# Patient Record
Sex: Male | Born: 1960 | Race: Black or African American | Hispanic: No | Marital: Single | Smoking: Current every day smoker
Health system: Southern US, Community
[De-identification: ages and names within clinical notes are randomized; demographics above are authoritative.]

## PROBLEM LIST (undated history)

## (undated) DIAGNOSIS — I10 Essential (primary) hypertension: Secondary | ICD-10-CM

## (undated) DIAGNOSIS — G43909 Migraine, unspecified, not intractable, without status migrainosus: Secondary | ICD-10-CM

## (undated) DIAGNOSIS — Z21 Asymptomatic human immunodeficiency virus [HIV] infection status: Secondary | ICD-10-CM

## (undated) DIAGNOSIS — B20 Human immunodeficiency virus [HIV] disease: Secondary | ICD-10-CM

## (undated) HISTORY — DX: Asymptomatic human immunodeficiency virus (hiv) infection status: Z21

## (undated) HISTORY — DX: Essential (primary) hypertension: I10

## (undated) HISTORY — DX: Human immunodeficiency virus (HIV) disease: B20

---

## 1988-05-16 HISTORY — PX: APPENDECTOMY: SHX54

## 2005-03-28 ENCOUNTER — Emergency Department (HOSPITAL_COMMUNITY): Admission: EM | Admit: 2005-03-28 | Discharge: 2005-03-28 | Payer: Self-pay | Admitting: Emergency Medicine

## 2008-11-30 ENCOUNTER — Emergency Department (HOSPITAL_COMMUNITY): Admission: EM | Admit: 2008-11-30 | Discharge: 2008-11-30 | Payer: Self-pay | Admitting: Emergency Medicine

## 2010-11-23 ENCOUNTER — Ambulatory Visit: Payer: Medicare Other

## 2010-12-02 ENCOUNTER — Ambulatory Visit (INDEPENDENT_AMBULATORY_CARE_PROVIDER_SITE_OTHER): Payer: Medicare Other

## 2010-12-02 DIAGNOSIS — Z113 Encounter for screening for infections with a predominantly sexual mode of transmission: Secondary | ICD-10-CM

## 2010-12-02 DIAGNOSIS — IMO0001 Reserved for inherently not codable concepts without codable children: Secondary | ICD-10-CM

## 2010-12-02 DIAGNOSIS — E785 Hyperlipidemia, unspecified: Secondary | ICD-10-CM

## 2010-12-02 DIAGNOSIS — Z111 Encounter for screening for respiratory tuberculosis: Secondary | ICD-10-CM

## 2010-12-02 DIAGNOSIS — B20 Human immunodeficiency virus [HIV] disease: Secondary | ICD-10-CM

## 2010-12-03 LAB — CBC WITH DIFFERENTIAL/PLATELET
Basophils Absolute: 0 10*3/uL (ref 0.0–0.1)
Basophils Relative: 1 % (ref 0–1)
Eosinophils Absolute: 0 10*3/uL (ref 0.0–0.7)
Eosinophils Relative: 1 % (ref 0–5)
HCT: 43.2 % (ref 39.0–52.0)
Hemoglobin: 14 g/dL (ref 13.0–17.0)
Lymphocytes Relative: 50 % — ABNORMAL HIGH (ref 12–46)
Lymphs Abs: 2.1 10*3/uL (ref 0.7–4.0)
MCH: 29.3 pg (ref 26.0–34.0)
MCHC: 32.4 g/dL (ref 30.0–36.0)
MCV: 90.4 fL (ref 78.0–100.0)
Monocytes Absolute: 0.5 10*3/uL (ref 0.1–1.0)
Monocytes Relative: 11 % (ref 3–12)
Neutro Abs: 1.6 10*3/uL — ABNORMAL LOW (ref 1.7–7.7)
Neutrophils Relative %: 38 % — ABNORMAL LOW (ref 43–77)
Platelets: 176 10*3/uL (ref 150–400)
RBC: 4.78 MIL/uL (ref 4.22–5.81)
RDW: 13.7 % (ref 11.5–15.5)
WBC: 4.2 10*3/uL (ref 4.0–10.5)

## 2010-12-03 LAB — URINALYSIS, MICROSCOPIC ONLY
Bacteria, UA: NONE SEEN
Casts: NONE SEEN
Crystals: NONE SEEN

## 2010-12-03 LAB — LIPID PANEL
Cholesterol: 181 mg/dL (ref 0–200)
HDL: 71 mg/dL (ref >39)
LDL Cholesterol: 96 mg/dL (ref 0–99)
Total CHOL/HDL Ratio: 2.5 Ratio
Triglycerides: 71 mg/dL (ref <150)
VLDL: 14 mg/dL (ref 0–40)

## 2010-12-03 LAB — URINALYSIS, ROUTINE W REFLEX MICROSCOPIC
Bilirubin Urine: NEGATIVE
Glucose, UA: NEGATIVE mg/dL
Hgb urine dipstick: NEGATIVE
Nitrite: NEGATIVE
Protein, ur: NEGATIVE mg/dL
Specific Gravity, Urine: 1.031 — ABNORMAL HIGH (ref 1.005–1.030)
Urobilinogen, UA: 0.2 mg/dL (ref 0.0–1.0)
pH: 5.5 (ref 5.0–8.0)

## 2010-12-03 LAB — HEPATITIS B SURFACE ANTIBODY,QUALITATIVE: Hep B S Ab: NEGATIVE

## 2010-12-03 LAB — COMPLETE METABOLIC PANEL WITH GFR
ALT: <8 U/L (ref 0–53)
AST: 14 U/L (ref 0–37)
Albumin: 4.6 g/dL (ref 3.5–5.2)
Alkaline Phosphatase: 54 U/L (ref 39–117)
BUN: 15 mg/dL (ref 6–23)
CO2: 26 mEq/L (ref 19–32)
Calcium: 9.4 mg/dL (ref 8.4–10.5)
Chloride: 108 mEq/L (ref 96–112)
Creat: 1.1 mg/dL (ref 0.50–1.35)
GFR, Est African American: >60 mL/min (ref >60)
GFR, Est Non African American: >60 mL/min (ref >60)
Glucose, Bld: 88 mg/dL (ref 70–99)
Potassium: 4.2 mEq/L (ref 3.5–5.3)
Sodium: 144 mEq/L (ref 135–145)
Total Bilirubin: 0.6 mg/dL (ref 0.3–1.2)
Total Protein: 6.8 g/dL (ref 6.0–8.3)

## 2010-12-03 LAB — T-HELPER CELL (CD4) - (RCID CLINIC ONLY)
CD4 % Helper T Cell: 31 % — ABNORMAL LOW (ref 33–55)
CD4 T Cell Abs: 670 uL (ref 400–2700)

## 2010-12-03 LAB — HEPATITIS C ANTIBODY: HCV Ab: NEGATIVE

## 2010-12-03 LAB — HEPATITIS B CORE ANTIBODY, IGM: Hep B C IgM: NEGATIVE

## 2010-12-03 LAB — HIV-1 RNA QUANT-NO REFLEX-BLD
HIV 1 RNA Quant: <20 copies/mL (ref <20)
HIV-1 RNA Quant, Log: <1.3 {Log} (ref <1.30)

## 2010-12-03 LAB — RPR

## 2010-12-03 LAB — HEPATITIS A ANTIBODY, IGM: Hep A IgM: NEGATIVE

## 2010-12-03 LAB — HEPATITIS B SURFACE ANTIGEN: Hepatitis B Surface Ag: NEGATIVE

## 2010-12-03 LAB — GC/CHLAMYDIA PROBE AMP, URINE
Chlamydia, Swab/Urine, PCR: NEGATIVE
GC Probe Amp, Urine: NEGATIVE

## 2010-12-06 LAB — TB SKIN TEST: TB Skin Test: NEGATIVE mm

## 2010-12-15 ENCOUNTER — Ambulatory Visit (INDEPENDENT_AMBULATORY_CARE_PROVIDER_SITE_OTHER): Payer: Medicare Other | Admitting: Internal Medicine

## 2010-12-15 ENCOUNTER — Encounter: Payer: Self-pay | Admitting: Internal Medicine

## 2010-12-15 VITALS — BP 121/80 | HR 62 | Temp 97.6°F | Ht 72.0 in | Wt 192.5 lb

## 2010-12-15 DIAGNOSIS — B20 Human immunodeficiency virus [HIV] disease: Secondary | ICD-10-CM

## 2010-12-15 DIAGNOSIS — Z21 Asymptomatic human immunodeficiency virus [HIV] infection status: Secondary | ICD-10-CM | POA: Insufficient documentation

## 2010-12-15 DIAGNOSIS — Z23 Encounter for immunization: Secondary | ICD-10-CM

## 2010-12-15 MED ORDER — EFAVIRENZ-EMTRICITAB-TENOFOVIR 600-200-300 MG PO TABS: 1.0000 | ORAL_TABLET | Freq: Every day | ORAL | Status: DC

## 2010-12-15 NOTE — Progress Notes (Signed)
  Subjective:    Patient ID: John Bullock, male    DOB: 1960/12/01, 50 y.o.   MRN: [id number]  HPI here as a new patient.  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  Started initially on combivir and Sustiva and changed to Atripla about 5 years ago.  Has been stable on Atripla since with a report of undetectable virus except for one remote blip.  He endorses 100% compliance.  No history of GC, chlamydia, syphillis.  Patient excercises daily, though he does smoke.  He is attempting to quit but patches made him tachycardic.  He does use condoms.     Review of Systems  All other systems reviewed and are negative.       Objective:   Physical Exam  Constitutional: He is oriented to person, place, and time. He appears well-developed and well-nourished. No distress.  HENT:  Mouth/Throat: No oropharyngeal exudate.  Eyes: No scleral icterus.  Neck: Normal range of motion. Neck supple.  Cardiovascular: Normal rate, regular rhythm and normal heart sounds.  Exam reveals no gallop and no friction rub.   No murmur heard. Pulmonary/Chest: Effort normal and breath sounds normal. No respiratory distress. He has no wheezes. He has no rales.  Abdominal: Soft. Bowel sounds are normal. He exhibits no distension. There is no tenderness. There is no rebound.  Lymphadenopathy:    He has no cervical adenopathy.  Neurological: He is alert and oriented to person, place, and time.  Skin: Skin is warm and dry. No erythema.  Psychiatric: He has a normal mood and affect. His behavior is normal.          Assessment & Plan:

## 2010-12-15 NOTE — Assessment & Plan Note (Signed)
Excellent adherence and no complaints with medication.  He was encouraged to continue compliance.  I did discuss excellent long term prognosis with continued compliance and emphasized the need to quit smoking, and continue to care for himself.  I emphasized condoms 100% of the time.  Today he will start hep A and B vaccine series.  Follow up in 4 months, [place]  If indicated.

## 2010-12-15 NOTE — Progress Notes (Signed)
Addended by: Jennet Maduro D on: 12/15/2010 09:25 AM   Modules accepted: Orders

## 2011-02-09 ENCOUNTER — Ambulatory Visit (INDEPENDENT_AMBULATORY_CARE_PROVIDER_SITE_OTHER): Payer: Medicare Other | Admitting: Licensed Clinical Social Worker

## 2011-02-09 DIAGNOSIS — B20 Human immunodeficiency virus [HIV] disease: Secondary | ICD-10-CM

## 2011-02-09 DIAGNOSIS — Z23 Encounter for immunization: Secondary | ICD-10-CM

## 2011-05-05 ENCOUNTER — Other Ambulatory Visit: Payer: Medicare Other

## 2011-05-05 ENCOUNTER — Other Ambulatory Visit: Payer: Self-pay | Admitting: Infectious Diseases

## 2011-05-05 DIAGNOSIS — B20 Human immunodeficiency virus [HIV] disease: Secondary | ICD-10-CM

## 2011-05-05 LAB — COMPLETE METABOLIC PANEL WITH GFR
ALT: 9 U/L (ref 0–53)
AST: 21 U/L (ref 0–37)
Albumin: 4.6 g/dL (ref 3.5–5.2)
Alkaline Phosphatase: 67 U/L (ref 39–117)
BUN: 16 mg/dL (ref 6–23)
CO2: 26 mEq/L (ref 19–32)
Calcium: 9 mg/dL (ref 8.4–10.5)
Chloride: 110 mEq/L (ref 96–112)
Creat: 1.12 mg/dL (ref 0.50–1.35)
GFR, Est African American: 89 mL/min
GFR, Est Non African American: 77 mL/min
Glucose, Bld: 88 mg/dL (ref 70–99)
Potassium: 4.4 mEq/L (ref 3.5–5.3)
Sodium: 142 mEq/L (ref 135–145)
Total Bilirubin: 0.3 mg/dL (ref 0.3–1.2)
Total Protein: 7.1 g/dL (ref 6.0–8.3)

## 2011-05-05 LAB — CBC WITH DIFFERENTIAL/PLATELET
Basophils Absolute: 0 10*3/uL (ref 0.0–0.1)
Basophils Relative: 0 % (ref 0–1)
Eosinophils Absolute: 0.1 10*3/uL (ref 0.0–0.7)
Eosinophils Relative: 2 % (ref 0–5)
HCT: 42.7 % (ref 39.0–52.0)
Hemoglobin: 14.3 g/dL (ref 13.0–17.0)
Lymphocytes Relative: 53 % — ABNORMAL HIGH (ref 12–46)
Lymphs Abs: 1.5 10*3/uL (ref 0.7–4.0)
MCH: 29.7 pg (ref 26.0–34.0)
MCHC: 33.5 g/dL (ref 30.0–36.0)
MCV: 88.8 fL (ref 78.0–100.0)
Monocytes Absolute: 0.4 10*3/uL (ref 0.1–1.0)
Monocytes Relative: 13 % — ABNORMAL HIGH (ref 3–12)
Neutro Abs: 0.9 10*3/uL — ABNORMAL LOW (ref 1.7–7.7)
Neutrophils Relative %: 33 % — ABNORMAL LOW (ref 43–77)
Platelets: 164 10*3/uL (ref 150–400)
RBC: 4.81 MIL/uL (ref 4.22–5.81)
RDW: 13.4 % (ref 11.5–15.5)
WBC: 2.9 10*3/uL — ABNORMAL LOW (ref 4.0–10.5)

## 2011-05-06 LAB — T-HELPER CELL (CD4) - (RCID CLINIC ONLY)
CD4 % Helper T Cell: 28 % — ABNORMAL LOW (ref 33–55)
CD4 T Cell Abs: 480 uL (ref 400–2700)

## 2011-05-06 LAB — PATHOLOGIST SMEAR REVIEW

## 2011-05-09 LAB — HIV-1 RNA QUANT-NO REFLEX-BLD
HIV 1 RNA Quant: 34 copies/mL — ABNORMAL HIGH (ref <20)
HIV-1 RNA Quant, Log: 1.53 {Log} — ABNORMAL HIGH (ref <1.30)

## 2011-05-24 ENCOUNTER — Ambulatory Visit (INDEPENDENT_AMBULATORY_CARE_PROVIDER_SITE_OTHER): Payer: Medicare Other | Admitting: Internal Medicine

## 2011-05-24 ENCOUNTER — Encounter: Payer: Self-pay | Admitting: Internal Medicine

## 2011-05-24 ENCOUNTER — Ambulatory Visit: Payer: Medicare Other | Admitting: Internal Medicine

## 2011-05-24 VITALS — BP 115/72 | HR 60 | Temp 97.4°F | Ht 72.0 in | Wt 205.1 lb

## 2011-05-24 DIAGNOSIS — B20 Human immunodeficiency virus [HIV] disease: Secondary | ICD-10-CM

## 2011-05-24 NOTE — Assessment & Plan Note (Signed)
He continues to do well and has no issues with compliance.  I encouraged continued compliance and also reiterated that he can live long and healthy with continued medication.  I reminded him of condom use and he is up to date with vaccines.  I did discuss at length the need to quit smoking and that he is at significant risk for complications from smoking.  We discussed the patch but the patient has had an adverse reaction to that previously.  He is motivated to quit and will try cold [place].

## 2011-05-24 NOTE — Progress Notes (Signed)
  Subjective:    Patient ID: John Bullock, male    DOB: November 16, 1960, 51 y.o.   MRN: [id number]  HPI 51 yo with hx of hiv including a history of PCP pneumonia but has been stable on Atripla for more than 5 years.  He comes in today for routine care with no complaints.  He continues to have excellent adherence to the medications and no new issues.  He denies diarrhea, weight loss, or other new issues.  No recent hospitalizations.      Review of Systems  Constitutional: Negative for fever, chills, activity change, fatigue and unexpected weight change.  HENT: Negative for sore throat and trouble swallowing.   Respiratory: Negative for cough and shortness of breath.   Gastrointestinal: Negative for nausea, abdominal pain and diarrhea.  Genitourinary: Negative for discharge and genital sores.  Musculoskeletal: Negative for myalgias.  Skin: Negative for rash.  Neurological: Negative for headaches.  Hematological: Negative for adenopathy.  Psychiatric/Behavioral: Negative for sleep disturbance and dysphoric mood.       Objective:   Physical Exam  Constitutional: He is oriented to person, place, and time. He appears well-developed and well-nourished. No distress.  HENT:  Mouth/Throat: Oropharynx is clear and moist. No oropharyngeal exudate.  Eyes: No scleral icterus.  Cardiovascular: Normal rate, regular rhythm and normal heart sounds.  Exam reveals no gallop and no friction rub.   No murmur heard. Pulmonary/Chest: Effort normal and breath sounds normal. No respiratory distress. He has no wheezes.  Abdominal: Soft. Bowel sounds are normal. He exhibits no distension. There is no tenderness.  Lymphadenopathy:    He has no cervical adenopathy.  Neurological: He is alert and oriented to person, place, and time.  Skin: Skin is warm and dry. No rash noted. No erythema.  Psychiatric: He has a normal mood and affect. His behavior is normal.          Assessment & Plan:

## 2011-05-24 NOTE — Patient Instructions (Signed)
Follow up in 4 months 

## 2011-05-30 ENCOUNTER — Ambulatory Visit (INDEPENDENT_AMBULATORY_CARE_PROVIDER_SITE_OTHER): Payer: Medicare Other

## 2011-05-30 DIAGNOSIS — Z23 Encounter for immunization: Secondary | ICD-10-CM | POA: Diagnosis not present

## 2011-06-09 ENCOUNTER — Encounter (HOSPITAL_COMMUNITY): Payer: Self-pay | Admitting: *Deleted

## 2011-06-09 ENCOUNTER — Emergency Department (INDEPENDENT_AMBULATORY_CARE_PROVIDER_SITE_OTHER)
Admission: EM | Admit: 2011-06-09 | Discharge: 2011-06-09 | Disposition: A | Discharge status: Home / Self Care | Payer: Medicare Other | Source: Home / Self Care | Attending: Family Medicine | Admitting: Family Medicine

## 2011-06-09 DIAGNOSIS — S39012A Strain of muscle, fascia and tendon of lower back, initial encounter: Secondary | ICD-10-CM

## 2011-06-09 DIAGNOSIS — M5380 Other specified dorsopathies, site unspecified: Secondary | ICD-10-CM

## 2011-06-09 DIAGNOSIS — S335XXA Sprain of ligaments of lumbar spine, initial encounter: Secondary | ICD-10-CM | POA: Diagnosis not present

## 2011-06-09 DIAGNOSIS — M62830 Muscle spasm of back: Secondary | ICD-10-CM

## 2011-06-09 MED ORDER — CYCLOBENZAPRINE HCL 5 MG PO TABS: 5.0000 mg | ORAL_TABLET | Freq: Three times a day (TID) | ORAL | Status: AC | PRN

## 2011-06-09 MED ORDER — NAPROXEN 500 MG PO TABS: 500.0000 mg | ORAL_TABLET | Freq: Two times a day (BID) | ORAL | Status: DC

## 2011-06-09 NOTE — ED Notes (Signed)
Pt      Advised  That  He    Would  Be  Seen  Soon pt  Said  OK

## 2011-06-09 NOTE — ED Provider Notes (Signed)
History     CSN: [id number]  Arrival date & time 06/09/11  1333   First MD Initiated Contact with Patient 06/09/11 1351      Chief Complaint  Patient presents with  . Back Pain    (Consider location/radiation/quality/duration/timing/severity/associated sxs/prior treatment) HPI Comments: Slipped and fell last week, falling forward; went to [place], stood all day, now with persistent tightness in RIGHT lower back, no numbness, no tingling, no weakness. Tender to palpation over RIGHT lower paraspinal muscles.  Patient is a 51 y.o. male presenting with back pain. The history is provided by the patient.  Back Pain  This is a new problem. The current episode started more than 1 week ago. The problem occurs constantly. The problem has not changed since onset.The pain is associated with falling. The pain is present in the lumbar spine. The pain does not radiate. The pain is moderate. Pertinent negatives include no numbness, no bowel incontinence, no perianal numbness, no paresthesias, no paresis, no tingling and no weakness.    Past Medical History  Diagnosis Date  . HIV infection     Past Surgical History  Procedure Date  . Appendectomy 1990    Family History  Problem Relation Age of Onset  . Brain cancer Mother   . Testicular cancer Father     History  Substance Use Topics  . Smoking status: Current Everyday Smoker -- 1.0 packs/day for 30 years    Types: Cigarettes  . Smokeless tobacco: Never Used   Comment: trying to quit-wants patches  . Alcohol Use: 0.5 oz/week    1 drink(s) per week     social drinker   alcohol       Review of Systems  Constitutional: Negative.   HENT: Negative.   Eyes: Negative.   Respiratory: Negative.   Cardiovascular: Negative.   Gastrointestinal: Negative.  Negative for bowel incontinence.  Genitourinary: Negative.   Musculoskeletal: Positive for back pain.  Skin: Negative.   Neurological: Negative.  Negative for tingling,  weakness, numbness and paresthesias.    Allergies  Nicotine step  Home Medications   Current Outpatient Rx  Name Route Sig Dispense Refill  . EFAVIRENZ-EMTRICITAB-TENOFOVIR 600-200-300 MG PO TABS Oral Take 1 tablet by mouth at bedtime. 30 tablet 11  . NAPROXEN 500 MG PO TABS Oral Take 1 tablet (500 mg total) by mouth 2 (two) times daily. 30 tablet 0    BP 137/95  Pulse 90  Temp(Src) 98.1 F (36.7 C) (Oral)  Resp 18  SpO2 96%  Physical Exam  Nursing note and vitals reviewed. Constitutional: He is oriented to person, place, and time. He appears well-developed and well-nourished.  HENT:  Head: Normocephalic and atraumatic.  Eyes: EOM are normal.  Neck: Normal range of motion.  Pulmonary/Chest: Effort normal.  Musculoskeletal: Normal range of motion.       Lumbar back: He exhibits tenderness, bony tenderness and pain.       Back: negative straight leg raise bilaterally, negative Fabers bilaterally, no pain with internal or external rotation bilaterally, full flexion, extension, abduction, and adduction; 5/5 strength  Neurological: He is alert and oriented to person, place, and time.  Skin: Skin is warm and dry.  Psychiatric: His behavior is normal.    ED Course  Procedures (including critical care time)  Labs Reviewed - No data to display No results found.   1. Lumbar strain   2. Muscle spasm of back       MDM  rx given for naproxen  and cyclobenzaprine PRN; return precautions given        Richardo Priest, MD 07/02/11 2245

## 2011-06-09 NOTE — ED Notes (Signed)
Pt  Reports   He  Felled    1  Week  Ago     Ago  When  He  Slipped  On  [employer]   He  Reports   Low  Back pain - he  Ambulates  Upright        And  Reports  The  Pain is  Worse  On movement

## 2011-06-20 ENCOUNTER — Ambulatory Visit: Payer: Medicare Other | Admitting: Internal Medicine

## 2011-07-25 ENCOUNTER — Emergency Department (HOSPITAL_COMMUNITY): Payer: Medicare Other

## 2011-07-25 ENCOUNTER — Emergency Department (HOSPITAL_COMMUNITY)
Admission: EM | Admit: 2011-07-25 | Discharge: 2011-07-25 | Disposition: A | Discharge status: Home / Self Care | Payer: Medicare Other | Attending: Emergency Medicine | Admitting: Emergency Medicine

## 2011-07-25 ENCOUNTER — Encounter (HOSPITAL_COMMUNITY): Payer: Self-pay | Admitting: Emergency Medicine

## 2011-07-25 DIAGNOSIS — Z8701 Personal history of pneumonia (recurrent): Secondary | ICD-10-CM | POA: Insufficient documentation

## 2011-07-25 DIAGNOSIS — Z87891 Personal history of nicotine dependence: Secondary | ICD-10-CM | POA: Diagnosis not present

## 2011-07-25 DIAGNOSIS — G43909 Migraine, unspecified, not intractable, without status migrainosus: Secondary | ICD-10-CM | POA: Diagnosis not present

## 2011-07-25 DIAGNOSIS — Z21 Asymptomatic human immunodeficiency virus [HIV] infection status: Secondary | ICD-10-CM | POA: Insufficient documentation

## 2011-07-25 DIAGNOSIS — Z9889 Other specified postprocedural states: Secondary | ICD-10-CM | POA: Insufficient documentation

## 2011-07-25 DIAGNOSIS — R51 Headache: Secondary | ICD-10-CM | POA: Diagnosis not present

## 2011-07-25 DIAGNOSIS — R42 Dizziness and giddiness: Secondary | ICD-10-CM | POA: Diagnosis not present

## 2011-07-25 LAB — POCT I-STAT, CHEM 8
BUN: 18 mg/dL (ref 6–23)
Calcium, Ion: 1.1 mmol/L — ABNORMAL LOW (ref 1.12–1.32)
Chloride: 109 mEq/L (ref 96–112)
Creatinine, Ser: 1 mg/dL (ref 0.50–1.35)
Glucose, Bld: 103 mg/dL — ABNORMAL HIGH (ref 70–99)
HCT: 41 % (ref 39.0–52.0)
Hemoglobin: 13.9 g/dL (ref 13.0–17.0)
Potassium: 3.8 mEq/L (ref 3.5–5.1)
Sodium: 140 mEq/L (ref 135–145)
TCO2: 23 mmol/L (ref 0–100)

## 2011-07-25 MED ORDER — IOHEXOL 300 MG/ML  SOLN
100.0000 mL | Freq: Once | INTRAMUSCULAR | Status: AC | PRN
  Administered 2011-07-25: 100 mL via INTRAVENOUS

## 2011-07-25 MED ORDER — METOCLOPRAMIDE HCL 5 MG/ML IJ SOLN
10.0000 mg | Freq: Once | INTRAMUSCULAR | Status: AC
  Administered 2011-07-25: 10 mg via INTRAVENOUS
  Filled 2011-07-25: qty 2

## 2011-07-25 MED ORDER — HYDROMORPHONE HCL PF 1 MG/ML IJ SOLN
1.0000 mg | Freq: Once | INTRAMUSCULAR | Status: AC
  Administered 2011-07-25: 1 mg via INTRAVENOUS
  Filled 2011-07-25: qty 1

## 2011-07-25 MED ORDER — HYDROCODONE-ACETAMINOPHEN 5-325 MG PO TABS: 2.0000 | ORAL_TABLET | ORAL | Status: AC | PRN

## 2011-07-25 MED ORDER — ONDANSETRON HCL 4 MG/2ML IJ SOLN
INTRAMUSCULAR | Status: AC
  Administered 2011-07-25: 4 mg
  Filled 2011-07-25: qty 2

## 2011-07-25 NOTE — ED Notes (Signed)
Pt to ED via [facility].  C/o headache x 3 days with dizziness.

## 2011-07-25 NOTE — ED Notes (Signed)
Patient complaining of migraines for the last four days; patient states that he has a history of recurrent migraines and was hospitalized for his migraines several years ago (two day hospital stay).  Patient states that the migraines are not constant; patient states that the migraines start when he lies down for sleep at night.  Patient also complaining of dizziness, weakness, and light-sensitivity.  Patient denise nausea/vomiting.  Patient alert and oriented x4; PERRL present. Will continue to monitor.

## 2011-07-25 NOTE — Discharge Instructions (Signed)
Migraine Headache  A migraine is very bad pain on one or both sides of your head. The cause of a migraine is not always known. A migraine can be triggered or caused by different things, such as:   Alcohol.   Smoking.   Stress.   Periods (menstruation) in women.   Aged cheeses.   Foods or drinks that contain nitrates, glutamate, aspartame, or tyramine.   Lack of sleep.   Chocolate.   Caffeine.   Hunger.   Medicines, such as nitroglycerine (used to treat chest pain), birth control pills, estrogen, and some blood pressure medicines.  HOME CARE   Many medicines can help migraine pain or keep migraines from coming back. Your doctor can help you decide on a medicine or treatment program.   If you or your child gets a migraine, it may help to lie down in a dark, quiet room.   Keep a headache journal. This may help find out what is causing the headaches. For example, write down:   What you eat and drink.   How much sleep you get.   Any change to your diet or medicines.  GET HELP RIGHT AWAY IF:    The medicine does not work.   The pain begins again.   The neck is stiff.   You have trouble seeing.   The muscles are weak or you lose muscle control.   You have new symptoms.   You lose your balance.   You have trouble walking.   You feel faint or pass out.  MAKE SURE YOU:    Understand these instructions.   Will watch this condition.   Will get help right away if you are not doing well or get worse.  Document Released: 02/09/2008 Document Revised: 04/21/2011 Document Reviewed: 01/05/2009  [employer] Patient Information 2012 [employer].

## 2011-07-25 NOTE — ED Provider Notes (Addendum)
History     CSN: [id number]  Arrival date & time 07/25/11  8469   First MD Initiated Contact with Patient 07/25/11 [phone]      Chief Complaint  Patient presents with  . Headache    (Consider location/radiation/quality/duration/timing/severity/associated sxs/prior treatment) HPI Complains of left-sided headache throbbing in nature accompanied by photophobia rash onset 4 days ago feels like migraine he's had several years ago denies dizziness no other complaint . No nausea no other associated symptoms pain is nonradiating. No treatment prior to coming here Past Medical History  Diagnosis Date  . HIV infection    PCP pneumonia; CD4 count from 2 months ago 480  Past Surgical History  Procedure Date  . Appendectomy 1990    Family History  Problem Relation Age of Onset  . Brain cancer Mother   . Testicular cancer Father     History  Substance Use Topics  . Smoking status: Former Smoker -- 1.0 packs/day for 30 years    Types: Cigarettes    Quit date: 06/27/2011  . Smokeless tobacco: Never Used   Comment: trying to quit-wants patches  . Alcohol Use: 0.5 oz/week    1 drink(s) per week     denies on visit 07/25/11   quit smoking one month ago    Review of Systems  Constitutional: Negative.   Eyes: Positive for photophobia.  Respiratory: Negative.   Cardiovascular: Negative.   Gastrointestinal: Negative.   Musculoskeletal: Negative.   Skin: Negative.   Neurological: Positive for headaches.  Hematological: Negative.   Psychiatric/Behavioral: Negative.     Allergies  Nicotine step  Home Medications   Current Outpatient Rx  Name Route Sig Dispense Refill  . EFAVIRENZ-EMTRICITAB-TENOFOVIR 600-200-300 MG PO TABS Oral Take 1 tablet by mouth at bedtime. 30 tablet 11    BP 127/81  Pulse 69  Temp(Src) 97 F (36.1 C) (Oral)  Resp 16  SpO2 95%  Physical Exam  Constitutional: He appears well-developed and well-nourished.  HENT:  Head: Normocephalic and  atraumatic.  Eyes: Conjunctivae are normal. Pupils are equal, round, and reactive to light.       Positive photophobia  Neck: Neck supple. No tracheal deviation present. No thyromegaly present.  Cardiovascular: Normal rate and regular rhythm.   No murmur heard. Pulmonary/Chest: Effort normal and breath sounds normal.  Abdominal: Soft. Bowel sounds are normal. He exhibits no distension. There is no tenderness.  Musculoskeletal: Normal range of motion. He exhibits no edema and no tenderness.  Neurological: He is alert. No cranial nerve deficit. He exhibits normal muscle tone. Coordination normal.  Skin: Skin is warm and dry. No rash noted.  Psychiatric: He has a normal mood and affect.    ED Course  Procedures (including critical care time) 7:40 AM pain is improved but is not adequately under control patient remains alert appropriate Glasgow Coma Score 15.Dilaudid ordered. 9:05 AM feels much improved and ready to go home alert in angle toward Glasgow Coma Score 15 Labs Reviewed - No data to display No results found.   No diagnosis found.   Results for orders placed during the hospital encounter of 07/25/11  POCT I-STAT, CHEM 8      Component Value Range   Sodium 140  135 - 145 (mEq/L)   Potassium 3.8  3.5 - 5.1 (mEq/L)   Chloride 109  96 - 112 (mEq/L)   BUN 18  6 - 23 (mg/dL)   Creatinine, Ser 2.84  0.50 - 1.35 (mg/dL)   Glucose, Bld 132 (*)  70 - 99 (mg/dL)   Calcium, Ion 5.62 (*) 1.12 - 1.32 (mmol/L)   TCO2 23  0 - 100 (mmol/L)   Hemoglobin 13.9  13.0 - 17.0 (g/dL)   HCT 13.0  86.5 - 78.4 (%)   Ct Head W Wo Contrast  07/25/2011  *RADIOLOGY REPORT*  Clinical Data: Migraines, HIV positive  CT HEAD WITHOUT AND WITH CONTRAST  Technique:  Contiguous axial images were obtained from the base of the skull through the vertex without and with intravenous contrast.  Contrast: OMNIPAQUE IOHEXOL 300 MG/ML IJ SOLN  Comparison: None.  Findings: No evidence of parenchymal hemorrhage or  extra-axial fluid collection. No mass lesion, mass effect, or midline shift.  No CT evidence of acute infarction.  Following contrast administration, no enhancing lesions are seen.  Cerebral volume is age appropriate.  No ventriculomegaly.  The visualized paranasal sinuses are essentially clear. The mastoid air cells are unopacified.  No evidence of calvarial fracture.  IMPRESSION: Normal head CT.  No enhancing lesions are seen.  Original Report Authenticated By: Charline Bills, M.D.    MDM  Plan prescription hydrocodone-A. Pap Diagnosis: Migraine headache        Doug Sou, MD 07/25/11 6962  Doug Sou, MD 07/25/11 [phone]

## 2011-09-13 ENCOUNTER — Other Ambulatory Visit: Payer: Medicare Other

## 2011-09-13 DIAGNOSIS — B20 Human immunodeficiency virus [HIV] disease: Secondary | ICD-10-CM | POA: Diagnosis not present

## 2011-09-13 LAB — COMPREHENSIVE METABOLIC PANEL
ALT: 8 U/L (ref 0–53)
AST: 23 U/L (ref 0–37)
Albumin: 5 g/dL (ref 3.5–5.2)
Alkaline Phosphatase: 66 U/L (ref 39–117)
BUN: 19 mg/dL (ref 6–23)
CO2: 26 mEq/L (ref 19–32)
Calcium: 9.5 mg/dL (ref 8.4–10.5)
Chloride: 109 mEq/L (ref 96–112)
Creat: 1.32 mg/dL (ref 0.50–1.35)
Glucose, Bld: 100 mg/dL — ABNORMAL HIGH (ref 70–99)
Potassium: 4 mEq/L (ref 3.5–5.3)
Sodium: 140 mEq/L (ref 135–145)
Total Bilirubin: 0.5 mg/dL (ref 0.3–1.2)
Total Protein: 7.1 g/dL (ref 6.0–8.3)

## 2011-09-13 LAB — CBC WITH DIFFERENTIAL/PLATELET
Basophils Absolute: 0 10*3/uL (ref 0.0–0.1)
Basophils Relative: 0 % (ref 0–1)
Eosinophils Absolute: 0.1 10*3/uL (ref 0.0–0.7)
Eosinophils Relative: 2 % (ref 0–5)
HCT: 44.5 % (ref 39.0–52.0)
Hemoglobin: 14.8 g/dL (ref 13.0–17.0)
Lymphocytes Relative: 39 % (ref 12–46)
Lymphs Abs: 1.5 10*3/uL (ref 0.7–4.0)
MCH: 29.6 pg (ref 26.0–34.0)
MCHC: 33.3 g/dL (ref 30.0–36.0)
MCV: 89 fL (ref 78.0–100.0)
Monocytes Absolute: 0.5 10*3/uL (ref 0.1–1.0)
Monocytes Relative: 13 % — ABNORMAL HIGH (ref 3–12)
Neutro Abs: 1.8 10*3/uL (ref 1.7–7.7)
Neutrophils Relative %: 46 % (ref 43–77)
Platelets: 180 10*3/uL (ref 150–400)
RBC: 5 MIL/uL (ref 4.22–5.81)
RDW: 13.7 % (ref 11.5–15.5)
WBC: 3.9 10*3/uL — ABNORMAL LOW (ref 4.0–10.5)

## 2011-09-14 LAB — T-HELPER CELL (CD4) - (RCID CLINIC ONLY)
CD4 % Helper T Cell: 31 % — ABNORMAL LOW (ref 33–55)
CD4 T Cell Abs: 460 uL (ref 400–2700)

## 2011-09-15 LAB — HIV-1 RNA QUANT-NO REFLEX-BLD
HIV 1 RNA Quant: <20 copies/mL (ref <20)
HIV-1 RNA Quant, Log: <1.3 {Log} (ref <1.30)

## 2011-09-27 ENCOUNTER — Ambulatory Visit: Payer: Medicare Other | Admitting: Internal Medicine

## 2011-09-29 ENCOUNTER — Ambulatory Visit (INDEPENDENT_AMBULATORY_CARE_PROVIDER_SITE_OTHER): Payer: Medicare Other | Admitting: Internal Medicine

## 2011-09-29 ENCOUNTER — Encounter: Payer: Self-pay | Admitting: Internal Medicine

## 2011-09-29 VITALS — BP 122/76 | HR 76 | Temp 98.2°F | Ht 72.0 in | Wt 193.0 lb

## 2011-09-29 DIAGNOSIS — Z113 Encounter for screening for infections with a predominantly sexual mode of transmission: Secondary | ICD-10-CM | POA: Diagnosis not present

## 2011-09-29 DIAGNOSIS — Z79899 Other long term (current) drug therapy: Secondary | ICD-10-CM

## 2011-09-29 DIAGNOSIS — B20 Human immunodeficiency virus [HIV] disease: Secondary | ICD-10-CM | POA: Diagnosis not present

## 2011-09-29 NOTE — Progress Notes (Signed)
  Subjective:    Patient ID: John Bullock, male    DOB: 1960/05/22, 51 y.o.   MRN: [id number]  HPI Is in for followup of his HIV. He is a new patient to our clinic last year but had been on Atripla for over 5 years. He continues to take his Atripla with 100% compliance. No new issues. He does have a history of PCP pneumonia but no problems since. His CD4 and viral load have been under excellent control. He does have some sleeping difficulty occasionally and he attributes this to vivid dreams he is having. He does happy with his Atripla and not interested in changing at this time. He also has essentially quit smoking since his last visit was only about one cigarette per week.   Review of Systems  Constitutional: Negative.   HENT: Negative for sore throat and trouble swallowing.   Respiratory: Negative.   Cardiovascular: Negative.   Gastrointestinal: Negative.   Musculoskeletal: Negative.   Skin: Negative.   Neurological: Negative.   Psychiatric/Behavioral: Negative.        Objective:   Physical Exam  Constitutional: He appears well-developed and well-nourished. No distress.  HENT:  Mouth/Throat: Oropharynx is clear and moist. No oropharyngeal exudate.  Cardiovascular: Normal rate, regular rhythm and normal heart sounds.  Exam reveals no gallop and no friction rub.   No murmur heard. Pulmonary/Chest: Effort normal and breath sounds normal. No respiratory distress. He has no wheezes. He has no rales.  Abdominal: Soft. Bowel sounds are normal. He exhibits no distension. There is no tenderness. There is no rebound.  Lymphadenopathy:    He has no cervical adenopathy.  Skin: Skin is warm and dry. No rash noted. No erythema.  Psychiatric: He has a normal mood and affect. His behavior is normal.          Assessment & Plan:

## 2011-09-29 NOTE — Assessment & Plan Note (Signed)
He continues to do well and no changes to his medication. In the future, he may be prudent to try to switch to something like [place] to decrease his sleeping difficulty. He is also due for screening colonoscopy and he will be referred to gastroenterology. So requested referral to ophthalmology for his vision. I did encourage continued smoking cessation and discussed the long-term benefits of not smoking. He will return in 6 months for routine followup.

## 2011-10-05 ENCOUNTER — Encounter: Payer: Self-pay | Admitting: *Deleted

## 2011-10-05 NOTE — Progress Notes (Signed)
Patient ID: John Bullock, male   DOB: 12-04-1960, 51 y.o.   MRN: [id number]  Pt has appointment with:  1. [facility] [phone]) on Tuesday Oct 11, 2011 at 1:30pm with Dr. Evette Cristal. [facility] [phone]) on Tuesday November 15, 2011 @ 9:15am with Dr. [facility].  Tried to reach pt by phone but phone was out of area. Sending letter in mail to remind patient of upcoming appointments. Tacey Heap RN

## 2011-10-06 ENCOUNTER — Encounter: Payer: Self-pay | Admitting: *Deleted

## 2011-10-06 NOTE — Progress Notes (Signed)
Patient ID: John Bullock, male   DOB: 1961-04-23, 51 y.o.   MRN: [id number]  Need to know what office is listed on his Medicaid card before I can refer him to [facility]. Have sent letter to pt about this. Phone number is not working. Tacey Heap RN

## 2011-10-13 ENCOUNTER — Encounter: Payer: Self-pay | Admitting: *Deleted

## 2011-10-13 NOTE — Progress Notes (Signed)
Patient ID: John Bullock, male   DOB: [birth year], 51 y.o.   MRN: [id number]   Received NPI # from PCP listed on pts card. Pt now has appt with [facility] on Tuesday, 7/2 @ 3:30pm. Pt phone is not working so I have sent letter to patient address. Tacey Heap RN

## 2011-11-29 DIAGNOSIS — Z1211 Encounter for screening for malignant neoplasm of colon: Secondary | ICD-10-CM | POA: Diagnosis not present

## 2011-11-29 DIAGNOSIS — K648 Other hemorrhoids: Secondary | ICD-10-CM | POA: Diagnosis not present

## 2011-11-29 DIAGNOSIS — K5730 Diverticulosis of large intestine without perforation or abscess without bleeding: Secondary | ICD-10-CM | POA: Diagnosis not present

## 2011-11-29 DIAGNOSIS — D126 Benign neoplasm of colon, unspecified: Secondary | ICD-10-CM | POA: Diagnosis not present

## 2011-12-14 ENCOUNTER — Encounter: Payer: Self-pay | Admitting: Internal Medicine

## 2012-02-13 ENCOUNTER — Other Ambulatory Visit: Payer: Self-pay | Admitting: Licensed Clinical Social Worker

## 2012-02-13 DIAGNOSIS — B20 Human immunodeficiency virus [HIV] disease: Secondary | ICD-10-CM

## 2012-02-13 MED ORDER — EFAVIRENZ-EMTRICITAB-TENOFOVIR 600-200-300 MG PO TABS: 1.0000 | ORAL_TABLET | Freq: Every day | ORAL | Status: DC

## 2012-03-19 ENCOUNTER — Observation Stay (HOSPITAL_COMMUNITY)
Admission: EM | Admit: 2012-03-19 | Discharge: 2012-03-20 | Disposition: A | Discharge status: Home / Self Care | Payer: Medicare Other | Attending: Emergency Medicine | Admitting: Emergency Medicine

## 2012-03-19 ENCOUNTER — Encounter (HOSPITAL_COMMUNITY): Payer: Self-pay | Admitting: Emergency Medicine

## 2012-03-19 ENCOUNTER — Emergency Department (HOSPITAL_COMMUNITY): Payer: Medicare Other

## 2012-03-19 DIAGNOSIS — K5289 Other specified noninfective gastroenteritis and colitis: Principal | ICD-10-CM | POA: Insufficient documentation

## 2012-03-19 DIAGNOSIS — R5381 Other malaise: Secondary | ICD-10-CM | POA: Diagnosis not present

## 2012-03-19 DIAGNOSIS — R05 Cough: Secondary | ICD-10-CM | POA: Insufficient documentation

## 2012-03-19 DIAGNOSIS — R112 Nausea with vomiting, unspecified: Secondary | ICD-10-CM | POA: Diagnosis not present

## 2012-03-19 DIAGNOSIS — R197 Diarrhea, unspecified: Secondary | ICD-10-CM | POA: Diagnosis not present

## 2012-03-19 DIAGNOSIS — Z87891 Personal history of nicotine dependence: Secondary | ICD-10-CM | POA: Insufficient documentation

## 2012-03-19 DIAGNOSIS — R059 Cough, unspecified: Secondary | ICD-10-CM | POA: Diagnosis not present

## 2012-03-19 DIAGNOSIS — R509 Fever, unspecified: Secondary | ICD-10-CM | POA: Diagnosis not present

## 2012-03-19 DIAGNOSIS — R5383 Other fatigue: Secondary | ICD-10-CM | POA: Diagnosis not present

## 2012-03-19 DIAGNOSIS — R1110 Vomiting, unspecified: Secondary | ICD-10-CM | POA: Diagnosis not present

## 2012-03-19 DIAGNOSIS — B20 Human immunodeficiency virus [HIV] disease: Secondary | ICD-10-CM | POA: Diagnosis not present

## 2012-03-19 DIAGNOSIS — K529 Noninfective gastroenteritis and colitis, unspecified: Secondary | ICD-10-CM

## 2012-03-19 LAB — CBC WITH DIFFERENTIAL/PLATELET
Basophils Absolute: 0 10*3/uL (ref 0.0–0.1)
Basophils Relative: 0 % (ref 0–1)
Eosinophils Absolute: 0.1 10*3/uL (ref 0.0–0.7)
Eosinophils Relative: 2 % (ref 0–5)
HCT: 46.8 % (ref 39.0–52.0)
Hemoglobin: 16 g/dL (ref 13.0–17.0)
Lymphocytes Relative: 30 % (ref 12–46)
Lymphs Abs: 1.5 10*3/uL (ref 0.7–4.0)
MCH: 30.6 pg (ref 26.0–34.0)
MCHC: 34.2 g/dL (ref 30.0–36.0)
MCV: 89.5 fL (ref 78.0–100.0)
Monocytes Absolute: 0.8 10*3/uL (ref 0.1–1.0)
Monocytes Relative: 16 % — ABNORMAL HIGH (ref 3–12)
Neutro Abs: 2.5 10*3/uL (ref 1.7–7.7)
Neutrophils Relative %: 51 % (ref 43–77)
Platelets: 167 10*3/uL (ref 150–400)
RBC: 5.23 MIL/uL (ref 4.22–5.81)
RDW: 13.6 % (ref 11.5–15.5)
WBC: 4.8 10*3/uL (ref 4.0–10.5)

## 2012-03-19 LAB — URINE MICROSCOPIC-ADD ON

## 2012-03-19 LAB — URINALYSIS, ROUTINE W REFLEX MICROSCOPIC
Glucose, UA: NEGATIVE mg/dL
Ketones, ur: 15 mg/dL — AB
Leukocytes, UA: NEGATIVE
Nitrite: NEGATIVE
Protein, ur: 100 mg/dL — AB
Specific Gravity, Urine: 1.025 (ref 1.005–1.030)
Urobilinogen, UA: 1 mg/dL (ref 0.0–1.0)
pH: 5 (ref 5.0–8.0)

## 2012-03-19 LAB — BASIC METABOLIC PANEL
BUN: 15 mg/dL (ref 6–23)
CO2: 21 mEq/L (ref 19–32)
Calcium: 9.4 mg/dL (ref 8.4–10.5)
Chloride: 111 mEq/L (ref 96–112)
Creatinine, Ser: 1.64 mg/dL — ABNORMAL HIGH (ref 0.50–1.35)
GFR calc Af Amer: 55 mL/min — ABNORMAL LOW (ref >90)
GFR calc non Af Amer: 47 mL/min — ABNORMAL LOW (ref >90)
Glucose, Bld: 102 mg/dL — ABNORMAL HIGH (ref 70–99)
Potassium: 4.2 mEq/L (ref 3.5–5.1)
Sodium: 142 mEq/L (ref 135–145)

## 2012-03-19 MED ORDER — ONDANSETRON HCL 4 MG/2ML IJ SOLN
4.0000 mg | Freq: Once | INTRAMUSCULAR | Status: AC
  Administered 2012-03-19: 4 mg via INTRAVENOUS
  Filled 2012-03-19: qty 2

## 2012-03-19 MED ORDER — SODIUM CHLORIDE 0.9 % IV SOLN
1000.0000 mL | Freq: Once | INTRAVENOUS | Status: AC
  Administered 2012-03-19: 1000 mL via INTRAVENOUS

## 2012-03-19 MED ORDER — SODIUM CHLORIDE 0.9 % IV SOLN: 1000.0000 mL | INTRAVENOUS | Status: DC

## 2012-03-19 MED ORDER — ACETAMINOPHEN 325 MG PO TABS: 650.0000 mg | ORAL_TABLET | ORAL | Status: DC | PRN

## 2012-03-19 MED ORDER — SODIUM CHLORIDE 0.9 % IV BOLUS (SEPSIS)
1000.0000 mL | Freq: Once | INTRAVENOUS | Status: AC
  Administered 2012-03-19: 1000 mL via INTRAVENOUS

## 2012-03-19 MED ORDER — ONDANSETRON HCL 4 MG/2ML IJ SOLN: 4.0000 mg | Freq: Four times a day (QID) | INTRAMUSCULAR | Status: DC | PRN

## 2012-03-19 NOTE — ED Notes (Signed)
Pt c/o productive cough with green sputum and fever with pain with cough x 2 days; pt sts hx of PNA and feels same

## 2012-03-19 NOTE — ED Provider Notes (Signed)
History     CSN: [id number]  Arrival date & time 03/19/12  1125   First MD Initiated Contact with Patient 03/19/12 1412      Chief Complaint  Patient presents with  . URI  . Fever  . Cough    (Consider location/radiation/quality/duration/timing/severity/associated sxs/prior treatment) HPI Pt presenting with c/o cough, vomiting and diarrhea.  Pt states the symptoms began approx 4 days ago and he has had multiple episodes of vomiting- nonbloody and nonbilious and watery stool each day.  Has not been able to keep down liquids,  Also has had productive cough- with whitish green sputum.  No abdominal pain.  No fever/chills.  He states he has a hx of HIV infection, viral load has been undetectable, he is taking [place].  There are no other associated systemic symptoms, there are no other alleviating or modifying factors.   Past Medical History  Diagnosis Date  . HIV infection     Past Surgical History  Procedure Date  . Appendectomy 1990    Family History  Problem Relation Age of Onset  . Brain cancer Mother   . Testicular cancer Father     History  Substance Use Topics  . Smoking status: Former Smoker -- 1.0 packs/day for 30 years    Types: Cigarettes    Quit date: 06/27/2011  . Smokeless tobacco: Never Used     Comment: trying to quit-wants patches  . Alcohol Use: No      Review of Systems ROS reviewed and all otherwise negative except for mentioned in HPI  Allergies  Nicotine step  Home Medications   Current Outpatient Rx  Name  Route  Sig  Dispense  Refill  . EFAVIRENZ-EMTRICITAB-TENOFOVIR 600-200-300 MG PO TABS   Oral   Take 1 tablet by mouth at bedtime.   30 tablet   11   . LOPERAMIDE HCL 1 MG/5ML PO LIQD   Oral   Take 5 mLs (1 mg total) by mouth 3 (three) times daily as needed for diarrhea or loose stools.   30 mL   0   . PROMETHAZINE HCL 25 MG RE SUPP   Rectal   Place 1 suppository (25 mg total) rectally every 6 (six) hours as needed for  nausea.   12 each   0     BP 123/66  Pulse 70  Temp 97.7 F (36.5 C) (Oral)  Resp 22  SpO2 97% Vitals reviewed Physical Exam Physical Examination: General appearance - alert, well appearing, and in no distress Mental status - alert, oriented to person, place, and time Eyes - no scleral icterus, no conjunctival injection Mouth - mucous membranes tacky, OP without lesions Chest - clear to auscultation, no wheezes, rales or rhonchi, symmetric air entry Heart - normal rate, regular rhythm, normal S1, S2, no murmurs, rubs, clicks or gallops Abdomen - soft, nontender, nondistended, no masses or organomegaly, nabs Extremities - peripheral pulses normal, no pedal edema, no clubbing or cyanosis Skin - normal coloration and turgor, no rashes, brisk cap refill  ED Course  Procedures (including critical care time)  3:24 PM d/w Trixie Dredge, PA- pt placed on the CDU dehydration protocol.    Labs Reviewed  CBC WITH DIFFERENTIAL - Abnormal; Notable for the following:    Monocytes Relative 16 (*)     All other components within normal limits  BASIC METABOLIC PANEL - Abnormal; Notable for the following:    Glucose, Bld 102 (*)     Creatinine, Ser 1.64 (*)  GFR calc non Af Amer 47 (*)     GFR calc Af Amer 55 (*)     All other components within normal limits  URINALYSIS, ROUTINE W REFLEX MICROSCOPIC - Abnormal; Notable for the following:    Color, Urine AMBER (*)  BIOCHEMICALS MAY BE AFFECTED BY COLOR   Hgb urine dipstick SMALL (*)     Bilirubin Urine MODERATE (*)     Ketones, ur 15 (*)     Protein, ur 100 (*)     All other components within normal limits  URINE MICROSCOPIC-ADD ON - Abnormal; Notable for the following:    Squamous Epithelial / LPF FEW (*)     Bacteria, UA FEW (*)     Casts GRANULAR CAST (*)  HYALINE CASTS   All other components within normal limits   Dg Chest 2 View  03/19/2012  *RADIOLOGY REPORT*  Clinical Data: Cough, fever, weakness.  CHEST - 2 VIEW  Comparison:  None.  Findings: Heart size and mediastinal contours are within normal limits.  No confluent airspace opacity.  No pleural effusion or pneumothorax.  No acute osseous finding.  IMPRESSION: No radiographic evidence of acute cardiopulmonary process.   Original Report Authenticated By: Jearld Lesch, M.D.      1. Gastroenteritis       MDM  Pt presenting with cough, nausea/vomiting and diarrhea over the past several days.  Feels fatigued.  No abdominal pain. CXR without pneumonia.  Labs reassuring.  Pt placed on CDU dehydration protocol, d/w Trixie Dredge PA for further management in CDU.          Ethelda Chick, MD 03/20/12 [phone]

## 2012-03-19 NOTE — ED Notes (Signed)
Pt ambulated to restroom. 

## 2012-03-19 NOTE — ED Notes (Signed)
Report given to [place], RN 

## 2012-03-19 NOTE — ED Notes (Signed)
Pt is back in restroom

## 2012-03-19 NOTE — ED Notes (Signed)
Per PA ordered pt mechanical soft diet

## 2012-03-19 NOTE — ED Provider Notes (Signed)
4:13 PM Patient to move to CDU under observation, dehydration protocol.  This is a shared visit with Dr Karma Ganja.  Sign out received from Dr Karma Ganja.  Pt with hx well-controlled HIV, viral load undetectable x many years, presents with URI symptoms and N/V/D.  UA is pending.  Pt to receive IVF, nausea medications.  Pt to be d/c home when feeling better.    6:27 PM Patient reports he is not feeling much better, has not been able to eat or drink yet.  States he did have some ice chips earlier.  I have discussed all results with patient and plan for dehydration protocol.  Pt states he would like to stay and perhaps stay the night.  States he has an appointment at 10am tomorrow morning with his ID doctor, may like to stay until then to get IVF, nausea medications, work on attempting PO.  I have asked nurse to continue IVF and order liquid dinner tray.  Pt also states he is going to ask a family member to bring him food from outside.    9:54 PM Discussed patient with Dr Roselyn Bering who assumes care of patient at end of my shift.   (Pod B/D).    Results for orders placed during the hospital encounter of 03/19/12  CBC WITH DIFFERENTIAL      Component Value Range   WBC 4.8  4.0 - 10.5 K/uL   RBC 5.23  4.22 - 5.81 MIL/uL   Hemoglobin 16.0  13.0 - 17.0 g/dL   HCT 16.1  09.6 - 04.5 %   MCV 89.5  78.0 - 100.0 fL   MCH 30.6  26.0 - 34.0 pg   MCHC 34.2  30.0 - 36.0 g/dL   RDW 40.9  81.1 - 91.4 %   Platelets 167  150 - 400 K/uL   Neutrophils Relative 51  43 - 77 %   Neutro Abs 2.5  1.7 - 7.7 K/uL   Lymphocytes Relative 30  12 - 46 %   Lymphs Abs 1.5  0.7 - 4.0 K/uL   Monocytes Relative 16 (*) 3 - 12 %   Monocytes Absolute 0.8  0.1 - 1.0 K/uL   Eosinophils Relative 2  0 - 5 %   Eosinophils Absolute 0.1  0.0 - 0.7 K/uL   Basophils Relative 0  0 - 1 %   Basophils Absolute 0.0  0.0 - 0.1 K/uL  BASIC METABOLIC PANEL      Component Value Range   Sodium 142  135 - 145 mEq/L   Potassium 4.2  3.5 - 5.1 mEq/L   Chloride 111  96 - 112 mEq/L   CO2 21  19 - 32 mEq/L   Glucose, Bld 102 (*) 70 - 99 mg/dL   BUN 15  6 - 23 mg/dL   Creatinine, Ser 7.82 (*) 0.50 - 1.35 mg/dL   Calcium 9.4  8.4 - 95.6 mg/dL   GFR calc non Af Amer 47 (*) >90 mL/min   GFR calc Af Amer 55 (*) >90 mL/min  URINALYSIS, ROUTINE W REFLEX MICROSCOPIC      Component Value Range   Color, Urine AMBER (*) YELLOW   APPearance CLEAR  CLEAR   Specific Gravity, Urine 1.025  1.005 - 1.030   pH 5.0  5.0 - 8.0   Glucose, UA NEGATIVE  NEGATIVE mg/dL   Hgb urine dipstick SMALL (*) NEGATIVE   Bilirubin Urine MODERATE (*) NEGATIVE   Ketones, ur 15 (*) NEGATIVE mg/dL   Protein, ur  100 (*) NEGATIVE mg/dL   Urobilinogen, UA 1.0  0.0 - 1.0 mg/dL   Nitrite NEGATIVE  NEGATIVE   Leukocytes, UA NEGATIVE  NEGATIVE  URINE MICROSCOPIC-ADD ON      Component Value Range   Squamous Epithelial / LPF FEW (*) RARE   WBC, UA 0-2  <3 WBC/hpf   RBC / HPF 3-6  <3 RBC/hpf   Bacteria, UA FEW (*) RARE   Casts GRANULAR CAST (*) NEGATIVE   Urine-Other MUCOUS PRESENT     Dg Chest 2 View  03/19/2012  *RADIOLOGY REPORT*  Clinical Data: Cough, fever, weakness.  CHEST - 2 VIEW  Comparison: None.  Findings: Heart size and mediastinal contours are within normal limits.  No confluent airspace opacity.  No pleural effusion or pneumothorax.  No acute osseous finding.  IMPRESSION: No radiographic evidence of acute cardiopulmonary process.   Original Report Authenticated By: Jearld Lesch, M.D.       [place], [place] 03/19/12 2155

## 2012-03-20 ENCOUNTER — Other Ambulatory Visit (HOSPITAL_COMMUNITY)
Admission: RE | Admit: 2012-03-20 | Discharge: 2012-03-20 | Disposition: A | Discharge status: Home / Self Care | Payer: Medicare Other | Source: Ambulatory Visit | Attending: Internal Medicine | Admitting: Internal Medicine

## 2012-03-20 ENCOUNTER — Other Ambulatory Visit: Payer: Medicare Other

## 2012-03-20 DIAGNOSIS — B20 Human immunodeficiency virus [HIV] disease: Secondary | ICD-10-CM | POA: Diagnosis not present

## 2012-03-20 DIAGNOSIS — Z79899 Other long term (current) drug therapy: Secondary | ICD-10-CM

## 2012-03-20 DIAGNOSIS — Z113 Encounter for screening for infections with a predominantly sexual mode of transmission: Secondary | ICD-10-CM | POA: Insufficient documentation

## 2012-03-20 LAB — CBC WITH DIFFERENTIAL/PLATELET
Basophils Absolute: 0 10*3/uL (ref 0.0–0.1)
Basophils Relative: 0 % (ref 0–1)
Eosinophils Absolute: 0.1 10*3/uL (ref 0.0–0.7)
Eosinophils Relative: 3 % (ref 0–5)
HCT: 40.8 % (ref 39.0–52.0)
Hemoglobin: 13.6 g/dL (ref 13.0–17.0)
Lymphocytes Relative: 35 % (ref 12–46)
Lymphs Abs: 1.4 10*3/uL (ref 0.7–4.0)
MCH: 29.6 pg (ref 26.0–34.0)
MCHC: 33.3 g/dL (ref 30.0–36.0)
MCV: 88.7 fL (ref 78.0–100.0)
Monocytes Absolute: 0.6 10*3/uL (ref 0.1–1.0)
Monocytes Relative: 14 % — ABNORMAL HIGH (ref 3–12)
Neutro Abs: 1.9 10*3/uL (ref 1.7–7.7)
Neutrophils Relative %: 48 % (ref 43–77)
Platelets: 173 10*3/uL (ref 150–400)
RBC: 4.6 MIL/uL (ref 4.22–5.81)
RDW: 14.3 % (ref 11.5–15.5)
WBC: 4 10*3/uL (ref 4.0–10.5)

## 2012-03-20 LAB — COMPREHENSIVE METABOLIC PANEL
ALT: 10 U/L (ref 0–53)
AST: 25 U/L (ref 0–37)
Albumin: 4 g/dL (ref 3.5–5.2)
Alkaline Phosphatase: 78 U/L (ref 39–117)
BUN: 13 mg/dL (ref 6–23)
CO2: 19 mEq/L (ref 19–32)
Calcium: 8.8 mg/dL (ref 8.4–10.5)
Chloride: 114 mEq/L — ABNORMAL HIGH (ref 96–112)
Creat: 1.19 mg/dL (ref 0.50–1.35)
Glucose, Bld: 87 mg/dL (ref 70–99)
Potassium: 4.2 mEq/L (ref 3.5–5.3)
Sodium: 141 mEq/L (ref 135–145)
Total Bilirubin: 0.3 mg/dL (ref 0.3–1.2)
Total Protein: 6.2 g/dL (ref 6.0–8.3)

## 2012-03-20 LAB — LIPID PANEL
Cholesterol: 136 mg/dL (ref 0–200)
HDL: 60 mg/dL (ref >39)
LDL Cholesterol: 57 mg/dL (ref 0–99)
Total CHOL/HDL Ratio: 2.3 Ratio
Triglycerides: 97 mg/dL (ref <150)
VLDL: 19 mg/dL (ref 0–40)

## 2012-03-20 LAB — RPR

## 2012-03-20 MED ORDER — LOPERAMIDE HCL 1 MG/5ML PO LIQD: 1.0000 mg | Freq: Three times a day (TID) | ORAL | Status: DC | PRN

## 2012-03-20 MED ORDER — PROMETHAZINE HCL 25 MG RE SUPP: 25.0000 mg | Freq: Four times a day (QID) | RECTAL | Status: DC | PRN

## 2012-03-20 NOTE — ED Provider Notes (Signed)
8:12 AM BP 118/66  Pulse 65  Temp 98.3 F (36.8 C) (Oral)  Resp 18  SpO2 99% Patient here on dehydration protocol.  States he is feeling much better.  Denies any dizziness or racing heart.  History of HIV, nondetectable viral load, compliant with medications. Patient denies any current nausea, vomiting, abdominal pain, tenesmus.  CV: RRR, No M/R/G, Peripheral pulses intact. No peripheral edema. Lungs: CTAB Abd: Soft, Non tender, non distended Skin: Mucous membranes moist, good skin turgor.   Plan: Repeat vitals, orthostatic vital signs.  If negative patient is safe for discharge.   8:50 AM Negative orthostatic vital signs.  We'll discharge patient with loperamide and Phenergan for nausea if symptoms should recur.  Discussed return precautions.Discussed reasons to seek immediate care. Patient expresses understanding and agrees with plan.   Arthor Captain, PA-C 03/20/12 [phone]

## 2012-03-20 NOTE — ED Notes (Signed)
Per EDP pt is on a regular diet

## 2012-03-20 NOTE — ED Provider Notes (Signed)
Medical screening examination/treatment/procedure(s) were performed by non-physician practitioner and as supervising physician I was immediately available for consultation/collaboration.  Tobin Chad, MD 03/20/12 [phone]

## 2012-03-20 NOTE — ED Provider Notes (Signed)
Medical screening examination/treatment/procedure(s) were conducted as a shared visit with non-physician practitioner(s) and myself.  I personally evaluated the patient during the encounter  I saw this patient primarily  Ethelda Chick, MD 03/20/12 [phone]

## 2012-03-21 LAB — T-HELPER CELL (CD4) - (RCID CLINIC ONLY)
CD4 % Helper T Cell: 31 % — ABNORMAL LOW (ref 33–55)
CD4 T Cell Abs: 400 uL (ref 400–2700)

## 2012-03-21 LAB — HIV-1 RNA QUANT-NO REFLEX-BLD
HIV 1 RNA Quant: <20 copies/mL (ref <20)
HIV-1 RNA Quant, Log: <1.3 {Log} (ref <1.30)

## 2012-04-03 ENCOUNTER — Ambulatory Visit: Payer: Medicare Other | Admitting: Internal Medicine

## 2012-04-04 ENCOUNTER — Ambulatory Visit: Payer: Medicare Other | Admitting: Internal Medicine

## 2012-04-05 ENCOUNTER — Encounter: Payer: Self-pay | Admitting: Internal Medicine

## 2012-04-05 ENCOUNTER — Ambulatory Visit (INDEPENDENT_AMBULATORY_CARE_PROVIDER_SITE_OTHER): Payer: Medicare Other | Admitting: Internal Medicine

## 2012-04-05 VITALS — BP 123/77 | HR 56 | Temp 97.9°F | Ht 72.0 in | Wt 194.0 lb

## 2012-04-05 DIAGNOSIS — B20 Human immunodeficiency virus [HIV] disease: Secondary | ICD-10-CM

## 2012-04-05 DIAGNOSIS — Z139 Encounter for screening, unspecified: Secondary | ICD-10-CM | POA: Insufficient documentation

## 2012-04-05 DIAGNOSIS — Z23 Encounter for immunization: Secondary | ICD-10-CM

## 2012-04-05 NOTE — Assessment & Plan Note (Signed)
He did undergo colon cancer screening by his report. He was told to return in 5 years. I did discuss with him that he may benefit from having a primary care physician for other screening issues including prostate cancer as well as other things. He is going to consider calling for a primary physician.

## 2012-04-05 NOTE — Patient Instructions (Signed)
Consider obtaining a primary physician

## 2012-04-05 NOTE — Progress Notes (Signed)
  Subjective:    Patient ID: John Bullock, male    DOB: August 26, 1960, 51 y.o.   MRN: [id number]  HPI He comes in for followup of his HIV. He continues to take Atripla and denies any missed doses. He has been on Atripla for over 5 years and has good tolerance of the medication. He has no difficulties since his last visit. He does continue to smoke minimally and is motivated to quit.   Review of Systems  Constitutional: Negative for fever, chills, fatigue and unexpected weight change.  HENT: Negative for sore throat and trouble swallowing.   Respiratory: Negative for cough and shortness of breath.   Cardiovascular: Negative for palpitations and leg swelling.  Gastrointestinal: Negative for nausea, abdominal pain and diarrhea.  Musculoskeletal: Negative for myalgias, joint swelling and arthralgias.  Neurological: Negative for dizziness and headaches.       Objective:   Physical Exam  Constitutional: He appears well-developed and well-nourished. No distress.  HENT:  Mouth/Throat: Oropharynx is clear and moist. No oropharyngeal exudate.  Cardiovascular: Normal rate, regular rhythm and normal heart sounds.  Exam reveals no gallop and no friction rub.   No murmur heard. Pulmonary/Chest: Effort normal and breath sounds normal. No respiratory distress. He has no wheezes. He has no rales.          Assessment & Plan:

## 2012-04-05 NOTE — Assessment & Plan Note (Signed)
He continues to do well with his HIV. He knows to use condoms with sexual activity. He will return in 6 months for routine lab followup. He knows to call if he has any issues prior to that

## 2012-09-20 ENCOUNTER — Other Ambulatory Visit: Payer: Self-pay | Admitting: Internal Medicine

## 2012-09-20 ENCOUNTER — Other Ambulatory Visit (INDEPENDENT_AMBULATORY_CARE_PROVIDER_SITE_OTHER): Payer: Medicare Other

## 2012-09-20 DIAGNOSIS — B20 Human immunodeficiency virus [HIV] disease: Secondary | ICD-10-CM | POA: Diagnosis not present

## 2012-09-21 LAB — T-HELPER CELL (CD4) - (RCID CLINIC ONLY)
CD4 % Helper T Cell: 31 % — ABNORMAL LOW (ref 33–55)
CD4 T Cell Abs: 570 uL (ref 400–2700)

## 2012-09-23 LAB — HIV-1 RNA QUANT-NO REFLEX-BLD
HIV 1 RNA Quant: 35 copies/mL — ABNORMAL HIGH (ref <20)
HIV-1 RNA Quant, Log: 1.54 {Log} — ABNORMAL HIGH (ref <1.30)

## 2012-09-29 ENCOUNTER — Encounter (HOSPITAL_COMMUNITY): Payer: Self-pay | Admitting: *Deleted

## 2012-09-29 ENCOUNTER — Emergency Department (HOSPITAL_COMMUNITY)
Admission: EM | Admit: 2012-09-29 | Discharge: 2012-09-29 | Disposition: A | Discharge status: Home / Self Care | Payer: Medicare Other | Attending: Emergency Medicine | Admitting: Emergency Medicine

## 2012-09-29 ENCOUNTER — Emergency Department (HOSPITAL_COMMUNITY): Payer: Medicare Other

## 2012-09-29 DIAGNOSIS — R51 Headache: Secondary | ICD-10-CM | POA: Diagnosis not present

## 2012-09-29 DIAGNOSIS — Z87891 Personal history of nicotine dependence: Secondary | ICD-10-CM | POA: Diagnosis not present

## 2012-09-29 DIAGNOSIS — Z79899 Other long term (current) drug therapy: Secondary | ICD-10-CM | POA: Diagnosis not present

## 2012-09-29 DIAGNOSIS — Z21 Asymptomatic human immunodeficiency virus [HIV] infection status: Secondary | ICD-10-CM | POA: Insufficient documentation

## 2012-09-29 DIAGNOSIS — Z7982 Long term (current) use of aspirin: Secondary | ICD-10-CM | POA: Diagnosis not present

## 2012-09-29 DIAGNOSIS — R6889 Other general symptoms and signs: Secondary | ICD-10-CM | POA: Diagnosis not present

## 2012-09-29 DIAGNOSIS — H53149 Visual discomfort, unspecified: Secondary | ICD-10-CM | POA: Diagnosis not present

## 2012-09-29 DIAGNOSIS — G43909 Migraine, unspecified, not intractable, without status migrainosus: Secondary | ICD-10-CM | POA: Diagnosis not present

## 2012-09-29 LAB — POCT I-STAT, CHEM 8
BUN: 7 mg/dL (ref 6–23)
Calcium, Ion: 1.19 mmol/L (ref 1.12–1.23)
Chloride: 106 mEq/L (ref 96–112)
Creatinine, Ser: 1 mg/dL (ref 0.50–1.35)
Glucose, Bld: 93 mg/dL (ref 70–99)
HCT: 43 % (ref 39.0–52.0)
Hemoglobin: 14.6 g/dL (ref 13.0–17.0)
Potassium: 3.6 mEq/L (ref 3.5–5.1)
Sodium: 142 mEq/L (ref 135–145)
TCO2: 29 mmol/L (ref 0–100)

## 2012-09-29 LAB — SEDIMENTATION RATE: Sed Rate: 2 mm/hr (ref 0–16)

## 2012-09-29 MED ORDER — VALPROATE SODIUM 500 MG/5ML IV SOLN
500.0000 mg | Freq: Once | INTRAVENOUS | Status: DC
  Filled 2012-09-29: qty 5

## 2012-09-29 MED ORDER — KETOROLAC TROMETHAMINE 30 MG/ML IJ SOLN
30.0000 mg | Freq: Once | INTRAMUSCULAR | Status: AC
  Administered 2012-09-29: 30 mg via INTRAVENOUS
  Filled 2012-09-29: qty 1

## 2012-09-29 MED ORDER — DIPHENHYDRAMINE HCL 50 MG/ML IJ SOLN
25.0000 mg | Freq: Once | INTRAMUSCULAR | Status: AC
  Administered 2012-09-29: 25 mg via INTRAVENOUS
  Filled 2012-09-29: qty 1

## 2012-09-29 MED ORDER — METOCLOPRAMIDE HCL 5 MG/ML IJ SOLN
10.0000 mg | Freq: Once | INTRAMUSCULAR | Status: AC
  Administered 2012-09-29: 10 mg via INTRAVENOUS
  Filled 2012-09-29: qty 2

## 2012-09-29 MED ORDER — DEXAMETHASONE SODIUM PHOSPHATE 10 MG/ML IJ SOLN
10.0000 mg | Freq: Once | INTRAMUSCULAR | Status: AC
  Administered 2012-09-29: 10 mg via INTRAVENOUS
  Filled 2012-09-29: qty 1

## 2012-09-29 MED ORDER — SODIUM CHLORIDE 0.9 % IV BOLUS (SEPSIS)
1000.0000 mL | Freq: Once | INTRAVENOUS | Status: AC
  Administered 2012-09-29: 1000 mL via INTRAVENOUS

## 2012-09-29 MED ORDER — VALPROATE SODIUM 500 MG/5ML IV SOLN
500.0000 mg | INTRAVENOUS | Status: AC
  Administered 2012-09-29: 500 mg via INTRAVENOUS
  Filled 2012-09-29: qty 5

## 2012-09-29 NOTE — ED Notes (Signed)
Pt discharged.Vital signs stable and GCS 15 

## 2012-09-29 NOTE — ED Provider Notes (Signed)
History     CSN: [id number]  Arrival date & time 09/29/12  1723   First MD Initiated Contact with Patient 09/29/12 1735      Chief Complaint  Patient presents with  . Headache    (Consider location/radiation/quality/duration/timing/severity/associated sxs/prior treatment) HPI Comments: Patient complains of left-sided headache that is gradually getting worse or progress across his entire head. It started 3 days ago and has progressively worsened. Denies vomiting, nausea, diarrhea or fever. Endorses photophobia. Denies vision change, lightheadedness, dizziness, fever chills. He states had similar headaches in the past and was told she had migraines. Notably he has a history of HIV with last 570. Denies any focal weakness, numbness or tingling. She's been taking aspirin at home without relief.  The history is provided by the patient.    Past Medical History  Diagnosis Date  . HIV infection     Past Surgical History  Procedure Laterality Date  . Appendectomy  1990    Family History  Problem Relation Age of Onset  . Brain cancer Mother   . Testicular cancer Father     History  Substance Use Topics  . Smoking status: Former Smoker -- 1.00 packs/day for 30 years    Types: Cigarettes    Quit date: 06/27/2011  . Smokeless tobacco: Never Used     Comment: trying to quit-wants patches  . Alcohol Use: No      Review of Systems  Constitutional: Negative for fever, activity change and appetite change.  HENT: Negative for congestion and rhinorrhea.   Eyes: Positive for photophobia.  Respiratory: Negative for cough, chest tightness and shortness of breath.   Cardiovascular: Negative for chest pain.  Gastrointestinal: Negative for nausea, vomiting and abdominal pain.  Genitourinary: Negative for dysuria and hematuria.  Musculoskeletal: Negative for back pain.  Skin: Negative for rash.  Neurological: Positive for headaches. Negative for dizziness, weakness and  light-headedness.  A complete 10 system review of systems was obtained and all systems are negative except as noted in the HPI and PMH.    Allergies  Nicotine step  Home Medications   Current Outpatient Rx  Name  Route  Sig  Dispense  Refill  . aspirin EC 81 MG tablet   Oral   Take 81 mg by mouth daily.         Marland Kitchen efavirenz-emtricitabine-tenofovir (ATRIPLA) 600-200-300 MG per tablet   Oral   Take 1 tablet by mouth at bedtime.           BP 122/75  Pulse 52  Temp(Src) 98.1 F (36.7 C) (Oral)  Resp 20  SpO2 99%  Physical Exam  Constitutional: He is oriented to person, place, and time. He appears well-developed and well-nourished. No distress.  HENT:  Head: Normocephalic and atraumatic.  Mouth/Throat: Oropharynx is clear and moist. No oropharyngeal exudate.  Photophobic Left-sided scalp tenderness that does not localize the temporal artery  Eyes: Conjunctivae and EOM are normal. Pupils are equal, round, and reactive to light.  Neck: Normal range of motion.  No meningismus  Cardiovascular: Normal rate, regular rhythm and normal heart sounds.   No murmur heard. Pulmonary/Chest: Effort normal and breath sounds normal. No respiratory distress.  Abdominal: Soft. There is no tenderness. There is no rebound and no guarding.  Musculoskeletal: Normal range of motion. He exhibits no edema and no tenderness.  Neurological: He is alert and oriented to person, place, and time. No cranial nerve deficit. He exhibits normal muscle tone. Coordination normal.  CN 2-12 intact, 5/5  strength throughout, no ataxia on finger to nose  Skin: Skin is warm.    ED Course  Procedures (including critical care time)  Labs Reviewed  SEDIMENTATION RATE  POCT I-STAT, CHEM 8   Ct Head Wo Contrast  09/29/2012   *RADIOLOGY REPORT*  Clinical Data:  Migraine headaches.  Headaches for 3 days.  CT HEAD WITHOUT CONTRAST  Technique:  Contiguous axial images were obtained from the base of the skull  through the vertex without contrast  Comparison:  07/25/2011.  Findings:  The brain has a normal appearance without evidence for hemorrhage, acute infarction, hydrocephalus, or mass lesion.  There is no extra axial fluid collection.  The skull and paranasal sinuses are normal.  IMPRESSION: Normal CT of the head without contrast.   Original Report Authenticated By: Davonna Belling, M.D.     No diagnosis found.    MDM  Gradual onset headache for 3 days with photophobia, similar to previous. No fever. No focal weakness, numbness or tingling. immunocompetent HIV status  Patient appears nontoxic. Nonfocal neuro exam. CT head is negative. He has some left parietal tenderness but no pain of her artery. Sedimentation rate is normal.  Pain improved with headache cocktail. Patient feeling better and requesting discharge.      Glynn Octave, MD 09/29/12 2117

## 2012-09-29 NOTE — ED Notes (Signed)
The pt has had a headache for 3 days no n v or diarrhea.  He has taken aspirin for 3 days.  bradycardic

## 2012-10-04 ENCOUNTER — Ambulatory Visit (INDEPENDENT_AMBULATORY_CARE_PROVIDER_SITE_OTHER): Payer: Medicare Other | Admitting: Internal Medicine

## 2012-10-04 ENCOUNTER — Encounter: Payer: Self-pay | Admitting: Internal Medicine

## 2012-10-04 VITALS — BP 122/78 | HR 63 | Temp 97.9°F | Ht 72.0 in | Wt 188.0 lb

## 2012-10-04 DIAGNOSIS — R519 Headache, unspecified: Secondary | ICD-10-CM | POA: Insufficient documentation

## 2012-10-04 DIAGNOSIS — F17200 Nicotine dependence, unspecified, uncomplicated: Secondary | ICD-10-CM | POA: Diagnosis not present

## 2012-10-04 DIAGNOSIS — J309 Allergic rhinitis, unspecified: Secondary | ICD-10-CM

## 2012-10-04 DIAGNOSIS — Z720 Tobacco use: Secondary | ICD-10-CM | POA: Insufficient documentation

## 2012-10-04 DIAGNOSIS — Z79899 Other long term (current) drug therapy: Secondary | ICD-10-CM | POA: Diagnosis not present

## 2012-10-04 DIAGNOSIS — B20 Human immunodeficiency virus [HIV] disease: Secondary | ICD-10-CM

## 2012-10-04 DIAGNOSIS — Z113 Encounter for screening for infections with a predominantly sexual mode of transmission: Secondary | ICD-10-CM | POA: Diagnosis not present

## 2012-10-04 DIAGNOSIS — R51 Headache: Secondary | ICD-10-CM | POA: Diagnosis not present

## 2012-10-04 DIAGNOSIS — J302 Other seasonal allergic rhinitis: Secondary | ICD-10-CM

## 2012-10-04 MED ORDER — FLUTICASONE PROPIONATE 50 MCG/ACT NA SUSP: 2.0000 | Freq: Every day | NASAL | Status: DC

## 2012-10-04 NOTE — Assessment & Plan Note (Signed)
He continues to try to quit smoking. He is now down to 3 cigarettes daily. I encouraged continued cessation. He continues to want to do this on his own. I again went over the risks of smoking and likely that smoking cessation will be most important for his long-term health.

## 2012-10-04 NOTE — Assessment & Plan Note (Signed)
I am going to try Flonase which has worked for him before.

## 2012-10-04 NOTE — Assessment & Plan Note (Addendum)
Unclear etiology but no concerning signs at this time. He is going to first try hydration and then will use Flonase to see if it is related to his seasonal allergies. If no improvement I will have him referred to neurology.  He is going to call next week and let me know.   I also will refer him to ophthalmology to assure his vision is not part of the cause

## 2012-10-04 NOTE — Progress Notes (Addendum)
  Subjective:    Patient ID: John Bullock, male    DOB: 06-13-1960, 52 y.o.   MRN: [id number]  HPI He comes in for followup of HIV. He continues on Atripla and denies any missed doses and feels well. His CD4 is within normal limits and viral load is nearly undetectable as it has been.  He did go to the for a headache which was debilitating and he did call emergency medical services to take him to the emergency room. On evaluation in the emergency room, a CT scan and labs were unrevealing. His headache is left-sided, starts in his eye and dose the side of his head up the temporal region, is a dull ache, not throbbing and he does endorse some photophobia. He has had this before but never quite as severe as recently. He has been trying to determine causes. It does not seem to be associated with any particular food. He does suffer from seasonal allergies and also does not keep well hydrated.   Review of Systems  Constitutional: Negative for activity change, appetite change, fatigue and unexpected weight change.  HENT: Positive for congestion and sinus pressure. Negative for sore throat and trouble swallowing.   Eyes: Positive for visual disturbance.       Wears glasses and has not had recent eye exam    Respiratory: Negative for cough and shortness of breath.   Cardiovascular: Negative for chest pain and leg swelling.  Gastrointestinal: Negative for nausea, abdominal pain and diarrhea.  Musculoskeletal: Negative for myalgias, joint swelling and arthralgias.  Skin: Negative for rash.  Neurological: Positive for light-headedness and headaches. Negative for facial asymmetry, speech difficulty, weakness and numbness.  Hematological: Negative for adenopathy.  Psychiatric/Behavioral: Negative for sleep disturbance. The patient is not nervous/anxious.        Objective:   Physical Exam  Constitutional: He is oriented to person, place, and time. He appears well-developed and well-nourished. No  distress.  HENT:  Mouth/Throat: Oropharynx is clear and moist. No oropharyngeal exudate.  Cardiovascular: Normal rate, regular rhythm and normal heart sounds.  Exam reveals no gallop and no friction rub.   No murmur heard. Pulmonary/Chest: Effort normal and breath sounds normal. No respiratory distress. He has no wheezes. He has no rales.  Abdominal: Soft. Bowel sounds are normal. He exhibits no distension. There is no tenderness.  Lymphadenopathy:    He has no cervical adenopathy.  Neurological: He is alert and oriented to person, place, and time.  Skin: Skin is warm and dry. No rash noted.          Assessment & Plan:

## 2012-10-04 NOTE — Assessment & Plan Note (Signed)
He is doing well with his HIV. I will continue with the same. He will return in 6 months. I will check fasting labs at that time.

## 2013-01-03 ENCOUNTER — Emergency Department (HOSPITAL_COMMUNITY): Payer: Medicare Other

## 2013-01-03 ENCOUNTER — Encounter (HOSPITAL_COMMUNITY): Payer: Self-pay | Admitting: *Deleted

## 2013-01-03 ENCOUNTER — Emergency Department (HOSPITAL_COMMUNITY)
Admission: EM | Admit: 2013-01-03 | Discharge: 2013-01-03 | Disposition: A | Discharge status: Home / Self Care | Payer: Medicare Other | Attending: Emergency Medicine | Admitting: Emergency Medicine

## 2013-01-03 DIAGNOSIS — J209 Acute bronchitis, unspecified: Secondary | ICD-10-CM | POA: Insufficient documentation

## 2013-01-03 DIAGNOSIS — R197 Diarrhea, unspecified: Secondary | ICD-10-CM | POA: Diagnosis not present

## 2013-01-03 DIAGNOSIS — Z21 Asymptomatic human immunodeficiency virus [HIV] infection status: Secondary | ICD-10-CM | POA: Diagnosis not present

## 2013-01-03 DIAGNOSIS — R059 Cough, unspecified: Secondary | ICD-10-CM | POA: Diagnosis not present

## 2013-01-03 DIAGNOSIS — R0602 Shortness of breath: Secondary | ICD-10-CM | POA: Insufficient documentation

## 2013-01-03 DIAGNOSIS — F17200 Nicotine dependence, unspecified, uncomplicated: Secondary | ICD-10-CM | POA: Insufficient documentation

## 2013-01-03 DIAGNOSIS — R6883 Chills (without fever): Secondary | ICD-10-CM | POA: Insufficient documentation

## 2013-01-03 DIAGNOSIS — Z79899 Other long term (current) drug therapy: Secondary | ICD-10-CM | POA: Diagnosis not present

## 2013-01-03 DIAGNOSIS — R05 Cough: Secondary | ICD-10-CM | POA: Diagnosis not present

## 2013-01-03 DIAGNOSIS — J40 Bronchitis, not specified as acute or chronic: Secondary | ICD-10-CM

## 2013-01-03 LAB — CBC WITH DIFFERENTIAL/PLATELET
Basophils Absolute: 0 10*3/uL (ref 0.0–0.1)
Basophils Relative: 0 % (ref 0–1)
Eosinophils Absolute: 0 10*3/uL (ref 0.0–0.7)
Eosinophils Relative: 0 % (ref 0–5)
HCT: 38 % — ABNORMAL LOW (ref 39.0–52.0)
Hemoglobin: 13.3 g/dL (ref 13.0–17.0)
Lymphocytes Relative: 18 % (ref 12–46)
Lymphs Abs: 1.5 10*3/uL (ref 0.7–4.0)
MCH: 31.1 pg (ref 26.0–34.0)
MCHC: 35 g/dL (ref 30.0–36.0)
MCV: 89 fL (ref 78.0–100.0)
Monocytes Absolute: 1 10*3/uL (ref 0.1–1.0)
Monocytes Relative: 12 % (ref 3–12)
Neutro Abs: 5.8 10*3/uL (ref 1.7–7.7)
Neutrophils Relative %: 70 % (ref 43–77)
Platelets: 172 10*3/uL (ref 150–400)
RBC: 4.27 MIL/uL (ref 4.22–5.81)
RDW: 13.9 % (ref 11.5–15.5)
WBC: 8.4 10*3/uL (ref 4.0–10.5)

## 2013-01-03 LAB — COMPREHENSIVE METABOLIC PANEL
ALT: 5 U/L (ref 0–53)
AST: 17 U/L (ref 0–37)
Albumin: 3.9 g/dL (ref 3.5–5.2)
Alkaline Phosphatase: 71 U/L (ref 39–117)
BUN: 13 mg/dL (ref 6–23)
CO2: 25 mEq/L (ref 19–32)
Calcium: 9.3 mg/dL (ref 8.4–10.5)
Chloride: 106 mEq/L (ref 96–112)
Creatinine, Ser: 1.07 mg/dL (ref 0.50–1.35)
GFR calc Af Amer: >90 mL/min (ref >90)
GFR calc non Af Amer: 79 mL/min — ABNORMAL LOW (ref >90)
Glucose, Bld: 104 mg/dL — ABNORMAL HIGH (ref 70–99)
Potassium: 4.2 mEq/L (ref 3.5–5.1)
Sodium: 141 mEq/L (ref 135–145)
Total Bilirubin: 0.2 mg/dL — ABNORMAL LOW (ref 0.3–1.2)
Total Protein: 7.4 g/dL (ref 6.0–8.3)

## 2013-01-03 MED ORDER — IPRATROPIUM BROMIDE 0.02 % IN SOLN
0.5000 mg | RESPIRATORY_TRACT | Status: DC
  Administered 2013-01-03: 0.5 mg via RESPIRATORY_TRACT
  Filled 2013-01-03: qty 2.5

## 2013-01-03 MED ORDER — PREDNISONE 20 MG PO TABS
60.0000 mg | ORAL_TABLET | Freq: Once | ORAL | Status: AC
  Administered 2013-01-03: 60 mg via ORAL
  Filled 2013-01-03: qty 3

## 2013-01-03 MED ORDER — PREDNISONE 50 MG PO TABS: ORAL_TABLET | ORAL | Status: DC

## 2013-01-03 MED ORDER — DOXYCYCLINE HYCLATE 100 MG PO TABS
100.0000 mg | ORAL_TABLET | Freq: Once | ORAL | Status: AC
  Administered 2013-01-03: 100 mg via ORAL
  Filled 2013-01-03: qty 1

## 2013-01-03 MED ORDER — DOXYCYCLINE HYCLATE 100 MG PO CAPS: 100.0000 mg | ORAL_CAPSULE | Freq: Two times a day (BID) | ORAL | Status: DC

## 2013-01-03 MED ORDER — ALBUTEROL SULFATE (5 MG/ML) 0.5% IN NEBU
2.5000 mg | INHALATION_SOLUTION | RESPIRATORY_TRACT | Status: DC
  Administered 2013-01-03: 2.5 mg via RESPIRATORY_TRACT
  Filled 2013-01-03: qty 0.5

## 2013-01-03 MED ORDER — ALBUTEROL SULFATE HFA 108 (90 BASE) MCG/ACT IN AERS: 2.0000 | INHALATION_SPRAY | RESPIRATORY_TRACT | Status: DC | PRN

## 2013-01-03 NOTE — ED Notes (Signed)
Pt c/o coughing up green sputum with pain while coughing.  Pt also c/o up diarrhea x 2 days.  Hx of HIV.

## 2013-01-03 NOTE — ED Provider Notes (Signed)
I saw and evaluated the patient, reviewed the resident's note and I agree with the findings and plan.  HIV patient with negligible viral load and normal CD4 counts presents with cough. Normal chest x-ray. Likely bronchitis. Placed on doxy. Lungs are clear and patient was breathing comfortably, exam.  Dagmar Hait, MD 01/03/13 2317

## 2013-01-03 NOTE — ED Provider Notes (Signed)
CSN: [id number]     Arrival date & time 01/03/13  1529 History     First MD Initiated Contact with Patient 01/03/13 1636     Chief Complaint  Patient presents with  . Cough  . Diarrhea   (Consider location/radiation/quality/duration/timing/severity/associated sxs/prior Treatment) Patient is a 52 y.o. male presenting with cough. The history is provided by the patient and medical records.  Cough Cough characteristics:  Productive Sputum characteristics:  Yellow Severity:  Moderate Onset quality:  Gradual Duration:  3 days Timing:  Intermittent Progression:  Worsening Chronicity:  New Smoker: yes   Context: smoke exposure   Relieved by:  None tried Worsened by:  Smoking Ineffective treatments:  None tried Associated symptoms: chills and shortness of breath   Associated symptoms: no chest pain, no fever, no headaches, no rash, no rhinorrhea, no sore throat and no wheezing     Past Medical History  Diagnosis Date  . HIV infection    Past Surgical History  Procedure Laterality Date  . Appendectomy  1990   Family History  Problem Relation Age of Onset  . Brain cancer Mother   . Testicular cancer Father    History  Substance Use Topics  . Smoking status: Current Every Day Smoker -- 0.30 packs/day for 30 years    Types: Cigarettes  . Smokeless tobacco: Never Used     Comment: trying to quit-wants patches  . Alcohol Use: 0.0 oz/week     Comment: occasional    Review of Systems  Constitutional: Positive for chills. Negative for fever, activity change, appetite change and fatigue.  HENT: Negative for congestion, sore throat, facial swelling, rhinorrhea, trouble swallowing, neck pain, neck stiffness, voice change and sinus pressure.   Eyes: Negative.   Respiratory: Positive for cough and shortness of breath. Negative for choking, chest tightness and wheezing.   Cardiovascular: Negative for chest pain.  Gastrointestinal: Negative for nausea, vomiting and abdominal pain.   Genitourinary: Negative for dysuria, urgency, frequency, hematuria, flank pain and difficulty urinating.  Musculoskeletal: Negative for back pain and gait problem.  Skin: Negative for rash and wound.  Neurological: Negative for facial asymmetry, weakness, numbness and headaches.  Psychiatric/Behavioral: Negative for behavioral problems, confusion and agitation. The patient is not nervous/anxious and is not hyperactive.   All other systems reviewed and are negative.    Allergies  Nicotine step  Home Medications   Current Outpatient Rx  Name  Route  Sig  Dispense  Refill  . efavirenz-emtricitabine-tenofovir (ATRIPLA) 600-200-300 MG per tablet   Oral   Take 1 tablet by mouth at bedtime.         . fluticasone (FLONASE) 50 MCG/ACT nasal spray   Nasal   Place 2 sprays into the nose daily.   16 g   3   . albuterol (PROVENTIL HFA;VENTOLIN HFA) 108 (90 BASE) MCG/ACT inhaler   Inhalation   Inhale 2 puffs into the lungs every 4 (four) hours as needed for wheezing or shortness of breath.   1 Inhaler   0   . doxycycline (VIBRAMYCIN) 100 MG capsule   Oral   Take 1 capsule (100 mg total) by mouth 2 (two) times daily. One po bid x 7 days   14 capsule   0   . predniSONE (DELTASONE) 50 MG tablet      3 tabs po day one, then 2 po daily x 4 days   4 tablet   0    BP 123/72  Pulse 76  Temp(Src) 98.5 F (  36.9 C) (Oral)  Resp 16  Ht 6' (1.829 m)  Wt 195 lb (88.451 kg)  BMI 26.44 kg/m2  SpO2 98% Physical Exam  Nursing note and vitals reviewed. Constitutional: He is oriented to person, place, and time. He appears well-developed and well-nourished. No distress.  HENT:  Head: Normocephalic and atraumatic.  Right Ear: External ear normal.  Left Ear: External ear normal.  Mouth/Throat: No oropharyngeal exudate.  Eyes: Conjunctivae and EOM are normal. Pupils are equal, round, and reactive to light. Right eye exhibits no discharge. Left eye exhibits no discharge.  Neck: Normal  range of motion. Neck supple. No JVD present. No tracheal deviation present. No thyromegaly present.  Cardiovascular: Normal rate, regular rhythm, normal heart sounds and intact distal pulses.  Exam reveals no gallop and no friction rub.   No murmur heard. Pulmonary/Chest: Effort normal. No respiratory distress. He has wheezes. He exhibits no tenderness.  Patient with wheezing bilaterally in the lung posterior bases  Abdominal: Soft. Bowel sounds are normal. He exhibits no distension. There is no tenderness. There is no rebound and no guarding.  Musculoskeletal: Normal range of motion. He exhibits no edema and no tenderness.  Lymphadenopathy:    He has no cervical adenopathy.  Neurological: He is alert and oriented to person, place, and time. No cranial nerve deficit.  Skin: Skin is warm and dry. No rash noted. He is not diaphoretic. No pallor.  Psychiatric: He has a normal mood and affect. His behavior is normal.    ED Course   Procedures (including critical care time)  Labs Reviewed  COMPREHENSIVE METABOLIC PANEL - Abnormal; Notable for the following:    Glucose, Bld 104 (*)    Total Bilirubin 0.2 (*)    GFR calc non Af Amer 79 (*)    All other components within normal limits  CBC WITH DIFFERENTIAL - Abnormal; Notable for the following:    HCT 38.0 (*)    All other components within normal limits   Dg Chest 2 View  01/03/2013   *RADIOLOGY REPORT*  Clinical Data: Cough, fever, diarrhea, smoking history  CHEST - 2 VIEW  Comparison: Chest x-ray of 03/19/2012  Findings: No active infiltrate or effusion is seen.  There is some peribronchial thickening which may indicate bronchitis. Mediastinal contours are stable.  The heart is within normal limits in size.  No bony abnormality is seen.  IMPRESSION: No pneumonia.  Peribronchial thickening may indicate bronchitis.   Original Report Authenticated By: Dwyane Dee, M.D.   1. Bronchitis     MDM  52 yr old M patient with PMh of smoking here  with cough, yellowish sputum, chills and shortness of breath for 2 days. Patient found to have wheezing on my exam but is sating 100% on RA. CXR concerning for bronchitis. Appears to be an acute bronchitis in a smoker. Doubt ACS with no typical CP and no risk factors. No signs of PNA or PTX on CXR. Will give breathing treatment and treat this as a COPD exacerbation with steroids and ABX's.  Breathing treatment made patient feel asymptomatic. Continues not to have tachypnea, retractions of hypoxia. Still with some wheezing but improved. Will prescribe prednisone, doxy, and inhaler for bronchitis exacerbation in long time smoker with sputum change.  . Date: 01/03/2013  Rate: 73  Rhythm: normal sinus rhythm  QRS Axis: normal  Intervals: normal  ST/T Wave abnormalities: early repolarization  Conduction Disutrbances:none  Narrative Interpretation: LVH  Old EKG Reviewed: none available  Case discussed with Dr.  Murlean Caller, MD 01/03/13 [phone]

## 2013-02-08 ENCOUNTER — Other Ambulatory Visit: Payer: Self-pay | Admitting: Infectious Diseases

## 2013-02-08 ENCOUNTER — Other Ambulatory Visit: Payer: Self-pay | Admitting: Internal Medicine

## 2013-02-08 DIAGNOSIS — J309 Allergic rhinitis, unspecified: Secondary | ICD-10-CM

## 2013-02-08 DIAGNOSIS — B20 Human immunodeficiency virus [HIV] disease: Secondary | ICD-10-CM

## 2013-02-19 ENCOUNTER — Ambulatory Visit (INDEPENDENT_AMBULATORY_CARE_PROVIDER_SITE_OTHER): Payer: Medicare Other | Admitting: *Deleted

## 2013-02-19 DIAGNOSIS — Z23 Encounter for immunization: Secondary | ICD-10-CM | POA: Diagnosis not present

## 2013-02-25 ENCOUNTER — Emergency Department (HOSPITAL_COMMUNITY)
Admission: EM | Admit: 2013-02-25 | Discharge: 2013-02-25 | Disposition: A | Discharge status: Home / Self Care | Payer: Medicare Other | Attending: Emergency Medicine | Admitting: Emergency Medicine

## 2013-02-25 ENCOUNTER — Encounter (HOSPITAL_COMMUNITY): Payer: Self-pay | Admitting: Emergency Medicine

## 2013-02-25 DIAGNOSIS — F17200 Nicotine dependence, unspecified, uncomplicated: Secondary | ICD-10-CM | POA: Insufficient documentation

## 2013-02-25 DIAGNOSIS — T6391XA Toxic effect of contact with unspecified venomous animal, accidental (unintentional), initial encounter: Secondary | ICD-10-CM | POA: Insufficient documentation

## 2013-02-25 DIAGNOSIS — IMO0002 Reserved for concepts with insufficient information to code with codable children: Secondary | ICD-10-CM | POA: Diagnosis not present

## 2013-02-25 DIAGNOSIS — Z21 Asymptomatic human immunodeficiency virus [HIV] infection status: Secondary | ICD-10-CM | POA: Diagnosis not present

## 2013-02-25 DIAGNOSIS — Y939 Activity, unspecified: Secondary | ICD-10-CM | POA: Insufficient documentation

## 2013-02-25 DIAGNOSIS — T63461A Toxic effect of venom of wasps, accidental (unintentional), initial encounter: Secondary | ICD-10-CM | POA: Insufficient documentation

## 2013-02-25 DIAGNOSIS — Y929 Unspecified place or not applicable: Secondary | ICD-10-CM | POA: Insufficient documentation

## 2013-02-25 DIAGNOSIS — T7840XA Allergy, unspecified, initial encounter: Secondary | ICD-10-CM

## 2013-02-25 MED ORDER — HYDROXYZINE HCL 25 MG PO TABS: 25.0000 mg | ORAL_TABLET | Freq: Four times a day (QID) | ORAL | Status: DC | PRN

## 2013-02-25 MED ORDER — DIPHENHYDRAMINE HCL 25 MG PO CAPS
50.0000 mg | ORAL_CAPSULE | Freq: Once | ORAL | Status: AC
  Administered 2013-02-25: 50 mg via ORAL
  Filled 2013-02-25: qty 2

## 2013-02-25 MED ORDER — DEXAMETHASONE SODIUM PHOSPHATE 10 MG/ML IJ SOLN
10.0000 mg | Freq: Once | INTRAMUSCULAR | Status: AC
  Administered 2013-02-25: 10 mg via INTRAMUSCULAR
  Filled 2013-02-25: qty 1

## 2013-02-25 NOTE — ED Provider Notes (Signed)
CSN: [id number]     Arrival date & time 02/25/13  4098 History  This chart was scribed for non-physician practitioner Junious Silk, PA-C, working with Shelda Jakes, MD by Dorothey Baseman, ED Scribe. This patient was seen in room TR06C/TR06C and the patient's care was started at 9:23 AM.    Chief Complaint  Patient presents with  . Insect Bite   The history is provided by the patient. No language interpreter was used.   HPI Comments: John Bullock is a 52 y.o. male who presents to the Emergency Department complaining of an insect bite that occurred 2 days ago when the patient states that he was stung by eight bees to the bilateral hands while he was wearing cloth gloves. Patient reports associated bilateral hand swelling and mild itching onset yesterday. Patient reports that he applied cold water and toothpaste to the area without relief. He denies any pain to the area, trouble swallowing, shortness of breath, rashes, nausea, vomiting. Patient reports a history of HIV.   Past Medical History  Diagnosis Date  . HIV infection    Past Surgical History  Procedure Laterality Date  . Appendectomy  1990   Family History  Problem Relation Age of Onset  . Brain cancer Mother   . Testicular cancer Father    History  Substance Use Topics  . Smoking status: Current Every Day Smoker -- 0.30 packs/day for 30 years    Types: Cigarettes  . Smokeless tobacco: Never Used     Comment: trying to quit-wants patches  . Alcohol Use: 0.0 oz/week     Comment: occasional    Review of Systems  HENT: Negative for trouble swallowing.   Respiratory: Negative for shortness of breath.   Gastrointestinal: Negative for nausea and vomiting.  Musculoskeletal: Positive for joint swelling.  Skin: Negative for rash.  All other systems reviewed and are negative.    Allergies  Nicotine step  Home Medications   Current Outpatient Rx  Name  Route  Sig  Dispense  Refill  . albuterol (PROVENTIL  HFA;VENTOLIN HFA) 108 (90 BASE) MCG/ACT inhaler   Inhalation   Inhale 2 puffs into the lungs every 4 (four) hours as needed for wheezing or shortness of breath.   1 Inhaler   0   . ATRIPLA 600-200-300 MG per tablet      TAKE 1 TABLET BY MOUTH AT BEDTIME.   30 tablet   5   . doxycycline (VIBRAMYCIN) 100 MG capsule   Oral   Take 1 capsule (100 mg total) by mouth 2 (two) times daily. One po bid x 7 days   14 capsule   0   . fluticasone (FLONASE) 50 MCG/ACT nasal spray      PLACE TWO   SPRAYS INTO THE NOSE DAILY.   16 g   0   . predniSONE (DELTASONE) 50 MG tablet      3 tabs po day one, then 2 po daily x 4 days   4 tablet   0    Triage Vitals: BP 116/82  Pulse 62  Temp(Src) 97 F (36.1 C) (Oral)  Ht 6' (1.829 m)  Wt 195 lb (88.451 kg)  BMI 26.44 kg/m2  SpO2 99%  Physical Exam  Nursing note and vitals reviewed. Constitutional: He is oriented to person, place, and time. He appears well-developed and well-nourished. No distress.  HENT:  Head: Normocephalic and atraumatic.  Right Ear: External ear normal.  Left Ear: External ear normal.  Nose: Nose normal.  Eyes: Conjunctivae are normal.  Neck: Normal range of motion. No tracheal deviation present.  Cardiovascular: Normal rate, regular rhythm, normal heart sounds, intact distal pulses and normal pulses.   Pulmonary/Chest: Effort normal and breath sounds normal. No stridor. No respiratory distress.  Abdominal: Soft. He exhibits no distension. There is no tenderness.  Musculoskeletal: Normal range of motion.  Mild swelling to hands bilaterally. Compartment soft. Neurovascularly intact. No rash or erythema. No stingers felt in skin.   Neurological: He is alert and oriented to person, place, and time.  Skin: Skin is warm and dry. No rash noted. He is not diaphoretic.  Psychiatric: He has a normal mood and affect. His behavior is normal.    ED Course  Procedures (including critical care time)  Medications   dexamethasone (DECADRON) injection 10 mg (not administered)  diphenhydrAMINE (BENADRYL) capsule 50 mg (not administered)    DIAGNOSTIC STUDIES: Oxygen Saturation is 99% on room air, normal by my interpretation.    COORDINATION OF CARE: 9:25 AM- Will order Decadron and Benadryl to manage symptoms. Will discharge patient with Atarax. Discussed treatment plan with patient at bedside and patient verbalized agreement.     Labs Review Labs Reviewed - No data to display Imaging Review No results found.  EKG Interpretation   None       MDM   1. Allergic reaction, initial encounter   2. Bee sting, initial encounter    Patient evaluated prior to dc, is hemodynamically stable, in no respiratory distress, and denies the feeling of throat closing. Pt has been advised to take OTC benadryl & return to the ED if they have a mod-severe allergic rxn (s/s including throat closing, difficulty breathing, swelling of lips face or tongue). Pt only has mild swelling to hands bilaterally. Compartment soft. Neurovascularly intact. Pt is to follow up with their PCP. Pt is agreeable with plan & verbalizes understanding.   I personally performed the services described in this documentation, which was scribed in my presence. The recorded information has been reviewed and is accurate.      Mora Bellman, PA-C 02/25/13 [phone]

## 2013-02-25 NOTE — ED Notes (Signed)
Pt was stung by "eight bees" on Sat. Here today because hands are swollen.

## 2013-02-27 NOTE — ED Provider Notes (Signed)
Medical screening examination/treatment/procedure(s) were performed by non-physician practitioner and as supervising physician I was immediately available for consultation/collaboration.   Paquita Printy W. Edmund Holcomb, MD 02/27/13 1033 

## 2013-03-26 ENCOUNTER — Other Ambulatory Visit: Payer: Medicare Other

## 2013-03-28 ENCOUNTER — Other Ambulatory Visit: Payer: Medicare Other

## 2013-03-28 ENCOUNTER — Other Ambulatory Visit (HOSPITAL_COMMUNITY)
Admission: RE | Admit: 2013-03-28 | Discharge: 2013-03-28 | Disposition: A | Discharge status: Home / Self Care | Payer: Medicare Other | Source: Ambulatory Visit | Attending: Infectious Diseases | Admitting: Infectious Diseases

## 2013-03-28 DIAGNOSIS — Z79899 Other long term (current) drug therapy: Secondary | ICD-10-CM | POA: Diagnosis not present

## 2013-03-28 DIAGNOSIS — B20 Human immunodeficiency virus [HIV] disease: Secondary | ICD-10-CM | POA: Diagnosis not present

## 2013-03-28 DIAGNOSIS — Z113 Encounter for screening for infections with a predominantly sexual mode of transmission: Secondary | ICD-10-CM | POA: Diagnosis not present

## 2013-03-28 LAB — LIPID PANEL
Cholesterol: 196 mg/dL (ref 0–200)
HDL: 96 mg/dL (ref >39)
LDL Cholesterol: 90 mg/dL (ref 0–99)
Total CHOL/HDL Ratio: 2 Ratio
Triglycerides: 52 mg/dL (ref <150)
VLDL: 10 mg/dL (ref 0–40)

## 2013-03-28 LAB — COMPLETE METABOLIC PANEL WITH GFR
ALT: <8 U/L (ref 0–53)
AST: 16 U/L (ref 0–37)
Albumin: 4.5 g/dL (ref 3.5–5.2)
Alkaline Phosphatase: 65 U/L (ref 39–117)
BUN: 15 mg/dL (ref 6–23)
CO2: 26 mEq/L (ref 19–32)
Calcium: 8.9 mg/dL (ref 8.4–10.5)
Chloride: 107 mEq/L (ref 96–112)
Creat: 1.1 mg/dL (ref 0.50–1.35)
GFR, Est African American: 89 mL/min
GFR, Est Non African American: 77 mL/min
Glucose, Bld: 107 mg/dL — ABNORMAL HIGH (ref 70–99)
Potassium: 4.1 mEq/L (ref 3.5–5.3)
Sodium: 140 mEq/L (ref 135–145)
Total Bilirubin: 0.4 mg/dL (ref 0.3–1.2)
Total Protein: 6.8 g/dL (ref 6.0–8.3)

## 2013-03-28 LAB — RPR

## 2013-03-29 LAB — CBC WITH DIFFERENTIAL/PLATELET
Basophils Absolute: 0 10*3/uL (ref 0.0–0.1)
Basophils Relative: 1 % (ref 0–1)
Eosinophils Absolute: 0 10*3/uL (ref 0.0–0.7)
Eosinophils Relative: 2 % (ref 0–5)
HCT: 41.4 % (ref 39.0–52.0)
Hemoglobin: 14.2 g/dL (ref 13.0–17.0)
Lymphocytes Relative: 55 % — ABNORMAL HIGH (ref 12–46)
Lymphs Abs: 1.3 10*3/uL (ref 0.7–4.0)
MCH: 30 pg (ref 26.0–34.0)
MCHC: 34.3 g/dL (ref 30.0–36.0)
MCV: 87.3 fL (ref 78.0–100.0)
Monocytes Absolute: 0.4 10*3/uL (ref 0.1–1.0)
Monocytes Relative: 18 % — ABNORMAL HIGH (ref 3–12)
Neutro Abs: 0.6 10*3/uL — ABNORMAL LOW (ref 1.7–7.7)
Neutrophils Relative %: 24 % — ABNORMAL LOW (ref 43–77)
Platelets: 185 10*3/uL (ref 150–400)
RBC: 4.74 MIL/uL (ref 4.22–5.81)
RDW: 14.8 % (ref 11.5–15.5)
WBC: 2.4 10*3/uL — ABNORMAL LOW (ref 4.0–10.5)

## 2013-03-29 LAB — HIV-1 RNA QUANT-NO REFLEX-BLD
HIV 1 RNA Quant: <20 copies/mL (ref <20)
HIV-1 RNA Quant, Log: <1.3 {Log} (ref <1.30)

## 2013-03-29 LAB — T-HELPER CELL (CD4) - (RCID CLINIC ONLY)
CD4 % Helper T Cell: 31 % — ABNORMAL LOW (ref 33–55)
CD4 T Cell Abs: 460 /uL (ref 400–2700)

## 2013-04-09 ENCOUNTER — Ambulatory Visit: Payer: Medicare Other | Admitting: Internal Medicine

## 2013-04-23 ENCOUNTER — Ambulatory Visit (INDEPENDENT_AMBULATORY_CARE_PROVIDER_SITE_OTHER): Payer: Medicare Other | Admitting: Internal Medicine

## 2013-04-23 ENCOUNTER — Encounter: Payer: Self-pay | Admitting: Internal Medicine

## 2013-04-23 VITALS — Temp 97.5°F | Ht 72.0 in | Wt 186.0 lb

## 2013-04-23 DIAGNOSIS — R519 Headache, unspecified: Secondary | ICD-10-CM | POA: Insufficient documentation

## 2013-04-23 DIAGNOSIS — B20 Human immunodeficiency virus [HIV] disease: Secondary | ICD-10-CM | POA: Diagnosis not present

## 2013-04-23 DIAGNOSIS — R51 Headache: Secondary | ICD-10-CM | POA: Diagnosis not present

## 2013-04-23 DIAGNOSIS — F17200 Nicotine dependence, unspecified, uncomplicated: Secondary | ICD-10-CM

## 2013-04-23 DIAGNOSIS — Z720 Tobacco use: Secondary | ICD-10-CM

## 2013-04-23 DIAGNOSIS — G479 Sleep disorder, unspecified: Secondary | ICD-10-CM | POA: Diagnosis not present

## 2013-04-23 NOTE — Assessment & Plan Note (Signed)
Doing well.  I discussed changing to Stribild with the fatigue but it is not debilitating and he is resistant to change.

## 2013-04-23 NOTE — Assessment & Plan Note (Signed)
Discussed cessation and methods inclduing the [place] quit line.   4 minutes spent in counseling

## 2013-04-23 NOTE — Assessment & Plan Note (Signed)
This resolved after improving his hydration

## 2013-04-23 NOTE — Progress Notes (Signed)
   Subjective:    Patient ID: John Bullock, male    DOB: January 12, 1961, 52 y.o.   MRN: [id number]  HPI Here for follow up of HIV.  On Atripla and doing well.  Denies any missed doses.  He continues to have a suppressed virus and good CD4 count.  He complains of difficulty sleeping, waking up early in the morning and unable to go back to sleep.  He does sleep in the daytime and drinks caffeine during the day.  Also continues to struggle with smoking cessation.  No further headaches.  Exercises and eats right.  Cholesterol good.     Review of Systems  Constitutional: Negative for fatigue.  Eyes: Negative for visual disturbance.  Respiratory: Negative for cough and shortness of breath.   Cardiovascular: Negative for chest pain.  Gastrointestinal: Negative for nausea and diarrhea.  Neurological: Negative for dizziness and light-headedness.  Psychiatric/Behavioral: Positive for sleep disturbance.       Objective:   Physical Exam  Constitutional: He is oriented to person, place, and time. He appears well-developed and well-nourished. No distress.  HENT:  Mouth/Throat: No oropharyngeal exudate.  Eyes: Right eye exhibits no discharge. Left eye exhibits no discharge. No scleral icterus.  Cardiovascular: Normal rate, regular rhythm and normal heart sounds.   No murmur heard. Pulmonary/Chest: Effort normal and breath sounds normal. No respiratory distress. He has no wheezes.  Lymphadenopathy:    He has no cervical adenopathy.  Neurological: He is alert and oriented to person, place, and time.  Skin: Skin is warm and dry. No rash noted.  Psychiatric: He has a normal mood and affect.          Assessment & Plan:

## 2013-04-23 NOTE — Assessment & Plan Note (Signed)
Discussed sleep hygiene including reduction of caffeine, no daytime naps.  To return if no improvement and consider pharmacotherapy.

## 2013-06-28 ENCOUNTER — Emergency Department (HOSPITAL_COMMUNITY): Payer: Medicare Other

## 2013-06-28 ENCOUNTER — Encounter (HOSPITAL_COMMUNITY): Payer: Self-pay | Admitting: Emergency Medicine

## 2013-06-28 ENCOUNTER — Emergency Department (HOSPITAL_COMMUNITY)
Admission: EM | Admit: 2013-06-28 | Discharge: 2013-06-28 | Disposition: A | Discharge status: Home / Self Care | Payer: Medicare Other | Attending: Emergency Medicine | Admitting: Emergency Medicine

## 2013-06-28 DIAGNOSIS — Z21 Asymptomatic human immunodeficiency virus [HIV] infection status: Secondary | ICD-10-CM | POA: Insufficient documentation

## 2013-06-28 DIAGNOSIS — N12 Tubulo-interstitial nephritis, not specified as acute or chronic: Secondary | ICD-10-CM | POA: Diagnosis not present

## 2013-06-28 DIAGNOSIS — IMO0002 Reserved for concepts with insufficient information to code with codable children: Secondary | ICD-10-CM | POA: Insufficient documentation

## 2013-06-28 DIAGNOSIS — F17200 Nicotine dependence, unspecified, uncomplicated: Secondary | ICD-10-CM | POA: Insufficient documentation

## 2013-06-28 DIAGNOSIS — R1032 Left lower quadrant pain: Secondary | ICD-10-CM | POA: Diagnosis not present

## 2013-06-28 DIAGNOSIS — N10 Acute tubulo-interstitial nephritis: Secondary | ICD-10-CM | POA: Diagnosis not present

## 2013-06-28 LAB — CBC WITH DIFFERENTIAL/PLATELET
Basophils Absolute: 0 10*3/uL (ref 0.0–0.1)
Basophils Relative: 0 % (ref 0–1)
Eosinophils Absolute: 0 10*3/uL (ref 0.0–0.7)
Eosinophils Relative: 0 % (ref 0–5)
HCT: 41.9 % (ref 39.0–52.0)
Hemoglobin: 14.3 g/dL (ref 13.0–17.0)
Lymphocytes Relative: 13 % (ref 12–46)
Lymphs Abs: 1.2 10*3/uL (ref 0.7–4.0)
MCH: 30.7 pg (ref 26.0–34.0)
MCHC: 34.1 g/dL (ref 30.0–36.0)
MCV: 89.9 fL (ref 78.0–100.0)
Monocytes Absolute: 1 10*3/uL (ref 0.1–1.0)
Monocytes Relative: 11 % (ref 3–12)
Neutro Abs: 7.2 10*3/uL (ref 1.7–7.7)
Neutrophils Relative %: 76 % (ref 43–77)
Platelets: 164 10*3/uL (ref 150–400)
RBC: 4.66 MIL/uL (ref 4.22–5.81)
RDW: 13.3 % (ref 11.5–15.5)
WBC: 9.5 10*3/uL (ref 4.0–10.5)

## 2013-06-28 LAB — URINE MICROSCOPIC-ADD ON

## 2013-06-28 LAB — URINALYSIS, ROUTINE W REFLEX MICROSCOPIC
Glucose, UA: NEGATIVE mg/dL
Ketones, ur: 15 mg/dL — AB
Nitrite: NEGATIVE
Protein, ur: 100 mg/dL — AB
Specific Gravity, Urine: 1.028 (ref 1.005–1.030)
Urobilinogen, UA: 1 mg/dL (ref 0.0–1.0)
pH: 5.5 (ref 5.0–8.0)

## 2013-06-28 LAB — COMPREHENSIVE METABOLIC PANEL
ALT: 6 U/L (ref 0–53)
AST: 15 U/L (ref 0–37)
Albumin: 3.9 g/dL (ref 3.5–5.2)
Alkaline Phosphatase: 70 U/L (ref 39–117)
BUN: 11 mg/dL (ref 6–23)
CO2: 27 mEq/L (ref 19–32)
Calcium: 8.9 mg/dL (ref 8.4–10.5)
Chloride: 105 mEq/L (ref 96–112)
Creatinine, Ser: 1.2 mg/dL (ref 0.50–1.35)
GFR calc Af Amer: 79 mL/min — ABNORMAL LOW (ref >90)
GFR calc non Af Amer: 68 mL/min — ABNORMAL LOW (ref >90)
Glucose, Bld: 122 mg/dL — ABNORMAL HIGH (ref 70–99)
Potassium: 4 mEq/L (ref 3.7–5.3)
Sodium: 143 mEq/L (ref 137–147)
Total Bilirubin: 0.5 mg/dL (ref 0.3–1.2)
Total Protein: 7.4 g/dL (ref 6.0–8.3)

## 2013-06-28 MED ORDER — KETOROLAC TROMETHAMINE 60 MG/2ML IM SOLN
60.0000 mg | Freq: Once | INTRAMUSCULAR | Status: AC
  Administered 2013-06-28: 60 mg via INTRAMUSCULAR
  Filled 2013-06-28: qty 2

## 2013-06-28 MED ORDER — LEVOFLOXACIN 750 MG PO TABS: 750.0000 mg | ORAL_TABLET | Freq: Every day | ORAL | Status: DC

## 2013-06-28 MED ORDER — LEVOFLOXACIN 750 MG PO TABS
750.0000 mg | ORAL_TABLET | Freq: Once | ORAL | Status: AC
  Administered 2013-06-28: 750 mg via ORAL
  Filled 2013-06-28: qty 1

## 2013-06-28 NOTE — Discharge Instructions (Signed)
If you were given medicines take as directed.  If you are on coumadin or contraceptives realize their levels and effectiveness is altered by many different medicines.  If you have any reaction (rash, tongues swelling, other) to the medicines stop taking and see a physician.   Please follow up as directed and return to the ER or see a physician for new or worsening symptoms.  Thank you.  Urinary Tract Infection Urinary tract infections (UTIs) can develop anywhere along your urinary tract. Your urinary tract is your body's drainage system for removing wastes and extra water. Your urinary tract includes two kidneys, two ureters, a bladder, and a urethra. Your kidneys are a pair of bean-shaped organs. Each kidney is about the size of your fist. They are located below your ribs, one on each side of your spine. CAUSES Infections are caused by microbes, which are microscopic organisms, including fungi, viruses, and bacteria. These organisms are so small that they can only be seen through a microscope. Bacteria are the microbes that most commonly cause UTIs. SYMPTOMS  Symptoms of UTIs may vary by age and gender of the patient and by the location of the infection. Symptoms in young women typically include a frequent and intense urge to urinate and a painful, burning feeling in the bladder or urethra during urination. Older women and men are more likely to be tired, shaky, and weak and have muscle aches and abdominal pain. A fever may mean the infection is in your kidneys. Other symptoms of a kidney infection include pain in your back or sides below the ribs, nausea, and vomiting. DIAGNOSIS To diagnose a UTI, your caregiver will ask you about your symptoms. Your caregiver also will ask to provide a urine sample. The urine sample will be tested for bacteria and white blood cells. White blood cells are made by your body to help fight infection. TREATMENT  Typically, UTIs can be treated with medication. Because most  UTIs are caused by a bacterial infection, they usually can be treated with the use of antibiotics. The choice of antibiotic and length of treatment depend on your symptoms and the type of bacteria causing your infection. HOME CARE INSTRUCTIONS  If you were prescribed antibiotics, take them exactly as your caregiver instructs you. Finish the medication even if you feel better after you have only taken some of the medication.  Drink enough water and fluids to keep your urine clear or pale yellow.  Avoid caffeine, tea, and carbonated beverages. They tend to irritate your bladder.  Empty your bladder often. Avoid holding urine for long periods of time.  Empty your bladder before and after sexual intercourse.  After a bowel movement, women should cleanse from front to back. Use each tissue only once. SEEK MEDICAL CARE IF:   You have back pain.  You develop a fever.  Your symptoms do not begin to resolve within 3 days. SEEK IMMEDIATE MEDICAL CARE IF:   You have severe back pain or lower abdominal pain.  You develop chills.  You have nausea or vomiting.  You have continued burning or discomfort with urination. MAKE SURE YOU:   Understand these instructions.  Will watch your condition.  Will get help right away if you are not doing well or get worse. Document Released: 02/09/2005 Document Revised: 11/01/2011 Document Reviewed: 06/10/2011 [facility] Patient Information 2014 [place].

## 2013-06-28 NOTE — ED Provider Notes (Signed)
CSN: [id number]     Arrival date & time 06/28/13  1050 History   First MD Initiated Contact with Patient 06/28/13 1414     Chief Complaint  Patient presents with  . Hematuria  . Urinary Incontinence  . Abdominal Pain     (Consider location/radiation/quality/duration/timing/severity/associated sxs/prior Treatment) HPI Comments: 53 yo male with HIV, undetectable recently, smoking presents with dysuria/ hematuria and left flank pain for two days. No kidney stone hx.  Burning pain, intermittent.  No hx of similar.   Patient is a 53 y.o. male presenting with hematuria and abdominal pain. The history is provided by the patient.  Hematuria Associated symptoms include abdominal pain. Pertinent negatives include no chest pain, no headaches and no shortness of breath.  Abdominal Pain Associated symptoms: dysuria and hematuria   Associated symptoms: no chest pain, no chills, no fever, no shortness of breath and no vomiting     Past Medical History  Diagnosis Date  . HIV infection    Past Surgical History  Procedure Laterality Date  . Appendectomy  1990   Family History  Problem Relation Age of Onset  . Brain cancer Mother   . Testicular cancer Father    History  Substance Use Topics  . Smoking status: Current Every Day Smoker -- 0.30 packs/day for 30 years    Types: Cigarettes  . Smokeless tobacco: Never Used     Comment: trying to quit-wants patches  . Alcohol Use: 0.0 oz/week     Comment: occasional    Review of Systems  Constitutional: Negative for fever and chills.  HENT: Negative for congestion.   Eyes: Negative for visual disturbance.  Respiratory: Negative for shortness of breath.   Cardiovascular: Negative for chest pain.  Gastrointestinal: Positive for abdominal pain. Negative for vomiting.  Genitourinary: Positive for dysuria, hematuria and flank pain.  Musculoskeletal: Negative for back pain, neck pain and neck stiffness.  Skin: Negative for rash.  Neurological:  Negative for light-headedness and headaches.      Allergies  Nicotine step  Home Medications   Current Outpatient Rx  Name  Route  Sig  Dispense  Refill  . efavirenz-emtricitabine-tenofovir (ATRIPLA) [phone] MG per tablet   Oral   Take 1 tablet by mouth at bedtime.         . fluticasone (FLONASE) 50 MCG/ACT nasal spray   Each Nare   Place 1 spray into both nostrils daily.         Marland Kitchen levofloxacin (LEVAQUIN) 750 MG tablet   Oral   Take 1 tablet (750 mg total) by mouth daily.   4 tablet   0    BP 117/75  Pulse 60  Temp(Src) 98.3 F (36.8 C) (Oral)  Resp 18  SpO2 98% Physical Exam  Nursing note and vitals reviewed. Constitutional: He is oriented to person, place, and time. He appears well-developed and well-nourished.  HENT:  Head: Normocephalic and atraumatic.  Eyes: Conjunctivae are normal. Right eye exhibits no discharge. Left eye exhibits no discharge.  Neck: Normal range of motion. Neck supple. No tracheal deviation present.  Cardiovascular: Normal rate and regular rhythm.   Pulmonary/Chest: Effort normal and breath sounds normal.  Abdominal: Soft. He exhibits no distension. There is tenderness (left flank). There is no guarding.  Musculoskeletal: He exhibits no edema.  Neurological: He is alert and oriented to person, place, and time.  Skin: Skin is warm. No rash noted.  Psychiatric: He has a normal mood and affect.    ED Course  Procedures (  including critical care time) Labs Review Labs Reviewed  URINALYSIS, ROUTINE W REFLEX MICROSCOPIC - Abnormal; Notable for the following:    Color, Urine AMBER (*)    APPearance TURBID (*)    Hgb urine dipstick LARGE (*)    Bilirubin Urine SMALL (*)    Ketones, ur 15 (*)    Protein, ur 100 (*)    Leukocytes, UA MODERATE (*)    All other components within normal limits  COMPREHENSIVE METABOLIC PANEL - Abnormal; Notable for the following:    Glucose, Bld 122 (*)    GFR calc non Af Amer 68 (*)    GFR calc Af  Amer 79 (*)    All other components within normal limits  URINE MICROSCOPIC-ADD ON - Abnormal; Notable for the following:    Bacteria, UA MANY (*)    All other components within normal limits  URINE CULTURE  CBC WITH DIFFERENTIAL   Imaging Review Ct Abdomen Pelvis Wo Contrast  06/28/2013   CLINICAL DATA:  Left lower quadrant abdominal pain. Burning with urination. Urinary incontinence.  EXAM: CT ABDOMEN AND PELVIS WITHOUT CONTRAST  TECHNIQUE: Multidetector CT imaging of the abdomen and pelvis was performed following the standard protocol without intravenous contrast.  COMPARISON:  No priors.  FINDINGS: Lung Bases: Unremarkable.  Abdomen/Pelvis: The unenhanced appearance of the liver, gallbladder, pancreas, spleen, bilateral adrenal glands and bilateral kidneys is unremarkable. No abnormal calcifications are identified within the collecting system of either kidney, along the course of either ureter, or within the lumen of the urinary bladder. No hydroureteronephrosis or perinephric stranding to suggest urinary tract obstruction at this time. No significant volume of ascites. No pneumoperitoneum. No pathologic distention of small bowel. No definite lymphadenopathy identified within the abdomen or pelvis on today's non contrast CT examination. There are numerous calcified lymph nodes throughout the small bowel mesentery, presumably related to prior infection. Prostate gland and urinary bladder are unremarkable in appearance.  Musculoskeletal: There are no aggressive appearing lytic or blastic lesions noted in the visualized portions of the skeleton.  IMPRESSION: 1. No acute findings in the abdomen or pelvis to account for the patient's symptoms. 2. Specifically, no abnormal urinary tract calculi or findings of urinary tract obstruction at this time. 3. Additional incidental findings, as above.   Electronically Signed   By: Vinnie Langton M.D.   On: 06/28/2013 16:29    EKG Interpretation   None        MDM   Final diagnoses:  Pyelonephritis    UTI vs pyelo with possible kidney stone. CT no stone. PO abx in ED. No penile discharge. Levaquin and outpt fup.   Pain controlled.  Results and differential diagnosis were discussed with the patient. Close follow up outpatient was discussed, patient comfortable with the plan.      Mariea Clonts, MD 06/28/13 [phone]

## 2013-06-28 NOTE — ED Notes (Signed)
Pt reports LLQ pain, burning with urination and urinary incontinence x2 days, woke up this am with blood in his urine, pt denies penile discharge

## 2013-06-28 NOTE — ED Notes (Signed)
Patient transported to CT 

## 2013-06-30 LAB — URINE CULTURE: Colony Count: >=100000

## 2013-07-09 ENCOUNTER — Telehealth: Payer: Self-pay | Admitting: Licensed Clinical Social Worker

## 2013-07-09 NOTE — Telephone Encounter (Signed)
Patient called concerned about his partner having Hep C and was wondering if he was at risk. I explained how the virus is passed through blood of an infected person, he had a negative test in 2012. He currently has no symptoms and uses condoms with his partner. I advised him to talk with his provider at the next visit if he would like to be tested.

## 2013-08-01 ENCOUNTER — Other Ambulatory Visit: Payer: Self-pay | Admitting: *Deleted

## 2013-08-01 DIAGNOSIS — B20 Human immunodeficiency virus [HIV] disease: Secondary | ICD-10-CM

## 2013-08-01 MED ORDER — EFAVIRENZ-EMTRICITAB-TENOFOVIR 600-200-300 MG PO TABS: 1.0000 | ORAL_TABLET | Freq: Every day | ORAL | Status: DC

## 2013-08-23 ENCOUNTER — Telehealth: Payer: Self-pay | Admitting: *Deleted

## 2013-08-23 DIAGNOSIS — J302 Other seasonal allergic rhinitis: Secondary | ICD-10-CM

## 2013-08-23 MED ORDER — FLUTICASONE PROPIONATE 50 MCG/ACT NA SUSP: 2.0000 | Freq: Every day | NASAL | Status: DC

## 2013-08-23 NOTE — Telephone Encounter (Signed)
Patient called requesting refill of his flonase.  Sent to the pharmacy. Landis Gandy, RN

## 2013-10-08 ENCOUNTER — Other Ambulatory Visit: Payer: Medicare Other

## 2013-10-08 DIAGNOSIS — B20 Human immunodeficiency virus [HIV] disease: Secondary | ICD-10-CM | POA: Diagnosis not present

## 2013-10-09 LAB — HIV-1 RNA QUANT-NO REFLEX-BLD
HIV 1 RNA Quant: <20 copies/mL (ref <20)
HIV-1 RNA Quant, Log: <1.3 {Log} (ref <1.30)

## 2013-10-09 LAB — T-HELPER CELL (CD4) - (RCID CLINIC ONLY)
CD4 % Helper T Cell: 36 % (ref 33–55)
CD4 T Cell Abs: 650 /uL (ref 400–2700)

## 2013-10-22 ENCOUNTER — Encounter: Payer: Self-pay | Admitting: Internal Medicine

## 2013-10-22 ENCOUNTER — Ambulatory Visit (INDEPENDENT_AMBULATORY_CARE_PROVIDER_SITE_OTHER): Payer: Medicare Other | Admitting: Internal Medicine

## 2013-10-22 VITALS — BP 121/87 | HR 66 | Temp 98.3°F | Ht 72.0 in | Wt 182.0 lb

## 2013-10-22 DIAGNOSIS — G479 Sleep disorder, unspecified: Secondary | ICD-10-CM | POA: Diagnosis not present

## 2013-10-22 DIAGNOSIS — R51 Headache: Secondary | ICD-10-CM

## 2013-10-22 DIAGNOSIS — J302 Other seasonal allergic rhinitis: Secondary | ICD-10-CM

## 2013-10-22 DIAGNOSIS — B20 Human immunodeficiency virus [HIV] disease: Secondary | ICD-10-CM | POA: Diagnosis not present

## 2013-10-22 DIAGNOSIS — F17200 Nicotine dependence, unspecified, uncomplicated: Secondary | ICD-10-CM

## 2013-10-22 DIAGNOSIS — Z720 Tobacco use: Secondary | ICD-10-CM

## 2013-10-22 DIAGNOSIS — J309 Allergic rhinitis, unspecified: Secondary | ICD-10-CM | POA: Diagnosis not present

## 2013-10-22 MED ORDER — ELVITEG-COBIC-EMTRICIT-TENOFDF 150-150-200-300 MG PO TABS: 1.0000 | ORAL_TABLET | Freq: Every day | ORAL | Status: DC

## 2013-10-22 NOTE — Assessment & Plan Note (Signed)
Has noted mold in his house.  Moving.  Uses Flonase intermittently.  Knows not to use daily with Stribild or we can change.

## 2013-10-22 NOTE — Progress Notes (Signed)
   Subjective:    Patient ID: John Bullock, male    DOB: [birth year], 53 y.o.   MRN: [id number]  HPI  Here for follow up of HIV.  On Atripla and doing well.  Denies any missed doses.  He continues to have a suppressed virus and good CD4 count.  He complains of difficulty sleeping same as last visit, waking up early in the morning and unable to go back to sleep.  He tried sleep hygiene suggestions.   Also continues to struggle with smoking cessation.  No further headaches.  Exercises and eats right.  Cholesterol good.     Review of Systems  Constitutional: Negative for fatigue.  Eyes: Negative for visual disturbance.  Respiratory: Negative for cough and shortness of breath.   Cardiovascular: Negative for chest pain.  Gastrointestinal: Negative for nausea and diarrhea.  Neurological: Negative for dizziness and light-headedness.  Psychiatric/Behavioral: Positive for sleep disturbance.       Objective:   Physical Exam  Constitutional: He is oriented to person, place, and time. He appears well-developed and well-nourished. No distress.  HENT:  Mouth/Throat: No oropharyngeal exudate.  Eyes: Right eye exhibits no discharge. Left eye exhibits no discharge. No scleral icterus.  Cardiovascular: Normal rate, regular rhythm and normal heart sounds.   No murmur heard. Pulmonary/Chest: Effort normal and breath sounds normal. No respiratory distress. He has no wheezes.  Lymphadenopathy:    He has no cervical adenopathy.  Neurological: He is alert and oriented to person, place, and time.  Skin: Skin is warm and dry. No rash noted.  Psychiatric: He has a normal mood and affect.          Assessment & Plan:

## 2013-10-22 NOTE — Assessment & Plan Note (Signed)
He continues to struggle with cessation.  Encouraged ocntiued attemtpts

## 2013-10-22 NOTE — Assessment & Plan Note (Signed)
Doing well.  Discussed sleep issues.  Has opted to change to Stribild.  Will change first of July with next refill and labs end of July with follow up in august.

## 2013-10-22 NOTE — Assessment & Plan Note (Signed)
Will see if stopping Atripla has any effect.

## 2013-10-22 NOTE — Assessment & Plan Note (Signed)
resolved 

## 2013-12-10 ENCOUNTER — Other Ambulatory Visit: Payer: Medicare Other

## 2013-12-10 DIAGNOSIS — B20 Human immunodeficiency virus [HIV] disease: Secondary | ICD-10-CM

## 2013-12-10 LAB — COMPLETE METABOLIC PANEL WITH GFR
ALT: 9 U/L (ref 0–53)
AST: 14 U/L (ref 0–37)
Albumin: 4.2 g/dL (ref 3.5–5.2)
Alkaline Phosphatase: 54 U/L (ref 39–117)
BUN: 13 mg/dL (ref 6–23)
CO2: 23 mEq/L (ref 19–32)
Calcium: 8.8 mg/dL (ref 8.4–10.5)
Chloride: 109 mEq/L (ref 96–112)
Creat: 1.16 mg/dL (ref 0.50–1.35)
GFR, Est African American: 83 mL/min
GFR, Est Non African American: 72 mL/min
Glucose, Bld: 113 mg/dL — ABNORMAL HIGH (ref 70–99)
Potassium: 4.1 mEq/L (ref 3.5–5.3)
Sodium: 141 mEq/L (ref 135–145)
Total Bilirubin: 0.3 mg/dL (ref 0.2–1.2)
Total Protein: 6.5 g/dL (ref 6.0–8.3)

## 2013-12-10 LAB — CBC WITH DIFFERENTIAL/PLATELET
Basophils Absolute: 0 10*3/uL (ref 0.0–0.1)
Basophils Relative: 0 % (ref 0–1)
Eosinophils Absolute: 0.1 10*3/uL (ref 0.0–0.7)
Eosinophils Relative: 2 % (ref 0–5)
HCT: 39.3 % (ref 39.0–52.0)
Hemoglobin: 13.4 g/dL (ref 13.0–17.0)
Lymphocytes Relative: 58 % — ABNORMAL HIGH (ref 12–46)
Lymphs Abs: 1.9 10*3/uL (ref 0.7–4.0)
MCH: 29.5 pg (ref 26.0–34.0)
MCHC: 34.1 g/dL (ref 30.0–36.0)
MCV: 86.4 fL (ref 78.0–100.0)
Monocytes Absolute: 0.4 10*3/uL (ref 0.1–1.0)
Monocytes Relative: 13 % — ABNORMAL HIGH (ref 3–12)
Neutro Abs: 0.9 10*3/uL — ABNORMAL LOW (ref 1.7–7.7)
Neutrophils Relative %: 27 % — ABNORMAL LOW (ref 43–77)
Platelets: 165 10*3/uL (ref 150–400)
RBC: 4.55 MIL/uL (ref 4.22–5.81)
RDW: 14.7 % (ref 11.5–15.5)
WBC: 3.3 10*3/uL — ABNORMAL LOW (ref 4.0–10.5)

## 2013-12-11 LAB — T-HELPER CELL (CD4) - (RCID CLINIC ONLY)
CD4 % Helper T Cell: 32 % — ABNORMAL LOW (ref 33–55)
CD4 T Cell Abs: 630 /uL (ref 400–2700)

## 2013-12-12 LAB — HIV-1 RNA QUANT-NO REFLEX-BLD
HIV 1 RNA Quant: <20 copies/mL (ref <20)
HIV-1 RNA Quant, Log: <1.3 {Log} (ref <1.30)

## 2013-12-20 ENCOUNTER — Inpatient Hospital Stay (HOSPITAL_COMMUNITY): Payer: Medicare Other

## 2013-12-20 ENCOUNTER — Inpatient Hospital Stay (HOSPITAL_COMMUNITY)
Admission: EM | Admit: 2013-12-20 | Discharge: 2013-12-22 | DRG: 103 | Disposition: A | Discharge status: Home / Self Care | Payer: Medicare Other | Attending: Internal Medicine | Admitting: Internal Medicine

## 2013-12-20 ENCOUNTER — Encounter (HOSPITAL_COMMUNITY): Payer: Self-pay | Admitting: Emergency Medicine

## 2013-12-20 ENCOUNTER — Emergency Department (HOSPITAL_COMMUNITY): Payer: Medicare Other

## 2013-12-20 DIAGNOSIS — M6281 Muscle weakness (generalized): Secondary | ICD-10-CM

## 2013-12-20 DIAGNOSIS — Z8249 Family history of ischemic heart disease and other diseases of the circulatory system: Secondary | ICD-10-CM

## 2013-12-20 DIAGNOSIS — G589 Mononeuropathy, unspecified: Secondary | ICD-10-CM | POA: Diagnosis present

## 2013-12-20 DIAGNOSIS — F17200 Nicotine dependence, unspecified, uncomplicated: Secondary | ICD-10-CM | POA: Diagnosis not present

## 2013-12-20 DIAGNOSIS — F1410 Cocaine abuse, uncomplicated: Secondary | ICD-10-CM | POA: Diagnosis present

## 2013-12-20 DIAGNOSIS — G44009 Cluster headache syndrome, unspecified, not intractable: Secondary | ICD-10-CM | POA: Diagnosis present

## 2013-12-20 DIAGNOSIS — Z888 Allergy status to other drugs, medicaments and biological substances status: Secondary | ICD-10-CM | POA: Diagnosis not present

## 2013-12-20 DIAGNOSIS — R209 Unspecified disturbances of skin sensation: Secondary | ICD-10-CM | POA: Diagnosis not present

## 2013-12-20 DIAGNOSIS — R519 Headache, unspecified: Secondary | ICD-10-CM

## 2013-12-20 DIAGNOSIS — I498 Other specified cardiac arrhythmias: Secondary | ICD-10-CM | POA: Diagnosis present

## 2013-12-20 DIAGNOSIS — N182 Chronic kidney disease, stage 2 (mild): Secondary | ICD-10-CM | POA: Diagnosis present

## 2013-12-20 DIAGNOSIS — I6350 Cerebral infarction due to unspecified occlusion or stenosis of unspecified cerebral artery: Secondary | ICD-10-CM | POA: Diagnosis not present

## 2013-12-20 DIAGNOSIS — Z823 Family history of stroke: Secondary | ICD-10-CM | POA: Diagnosis not present

## 2013-12-20 DIAGNOSIS — I639 Cerebral infarction, unspecified: Secondary | ICD-10-CM | POA: Diagnosis present

## 2013-12-20 DIAGNOSIS — G43909 Migraine, unspecified, not intractable, without status migrainosus: Principal | ICD-10-CM | POA: Diagnosis present

## 2013-12-20 DIAGNOSIS — R5383 Other fatigue: Secondary | ICD-10-CM | POA: Diagnosis not present

## 2013-12-20 DIAGNOSIS — Z808 Family history of malignant neoplasm of other organs or systems: Secondary | ICD-10-CM | POA: Diagnosis not present

## 2013-12-20 DIAGNOSIS — Z720 Tobacco use: Secondary | ICD-10-CM | POA: Diagnosis present

## 2013-12-20 DIAGNOSIS — Z21 Asymptomatic human immunodeficiency virus [HIV] infection status: Secondary | ICD-10-CM | POA: Diagnosis present

## 2013-12-20 DIAGNOSIS — R51 Headache: Secondary | ICD-10-CM | POA: Diagnosis not present

## 2013-12-20 DIAGNOSIS — I517 Cardiomegaly: Secondary | ICD-10-CM | POA: Diagnosis not present

## 2013-12-20 DIAGNOSIS — Z833 Family history of diabetes mellitus: Secondary | ICD-10-CM

## 2013-12-20 DIAGNOSIS — G44019 Episodic cluster headache, not intractable: Secondary | ICD-10-CM

## 2013-12-20 DIAGNOSIS — B20 Human immunodeficiency virus [HIV] disease: Secondary | ICD-10-CM

## 2013-12-20 DIAGNOSIS — R5381 Other malaise: Secondary | ICD-10-CM | POA: Diagnosis not present

## 2013-12-20 DIAGNOSIS — R9431 Abnormal electrocardiogram [ECG] [EKG]: Secondary | ICD-10-CM | POA: Diagnosis not present

## 2013-12-20 DIAGNOSIS — R001 Bradycardia, unspecified: Secondary | ICD-10-CM

## 2013-12-20 DIAGNOSIS — Z8043 Family history of malignant neoplasm of testis: Secondary | ICD-10-CM | POA: Diagnosis not present

## 2013-12-20 DIAGNOSIS — G43109 Migraine with aura, not intractable, without status migrainosus: Secondary | ICD-10-CM

## 2013-12-20 DIAGNOSIS — R531 Weakness: Secondary | ICD-10-CM

## 2013-12-20 HISTORY — DX: Migraine, unspecified, not intractable, without status migrainosus: G43.909

## 2013-12-20 LAB — COMPREHENSIVE METABOLIC PANEL
ALT: 6 U/L (ref 0–53)
AST: 15 U/L (ref 0–37)
Albumin: 3.5 g/dL (ref 3.5–5.2)
Alkaline Phosphatase: 72 U/L (ref 39–117)
Anion gap: 11 (ref 5–15)
BUN: 20 mg/dL (ref 6–23)
CO2: 23 mEq/L (ref 19–32)
Calcium: 8.6 mg/dL (ref 8.4–10.5)
Chloride: 111 mEq/L (ref 96–112)
Creatinine, Ser: 1.18 mg/dL (ref 0.50–1.35)
GFR calc Af Amer: 80 mL/min — ABNORMAL LOW (ref >90)
GFR calc non Af Amer: 69 mL/min — ABNORMAL LOW (ref >90)
Glucose, Bld: 95 mg/dL (ref 70–99)
Potassium: 4.1 mEq/L (ref 3.7–5.3)
Sodium: 145 mEq/L (ref 137–147)
Total Bilirubin: <0.2 mg/dL — ABNORMAL LOW (ref 0.3–1.2)
Total Protein: 6.2 g/dL (ref 6.0–8.3)

## 2013-12-20 LAB — PROTIME-INR
INR: 0.97 (ref 0.00–1.49)
Prothrombin Time: 12.9 seconds (ref 11.6–15.2)

## 2013-12-20 LAB — RAPID URINE DRUG SCREEN, HOSP PERFORMED
Amphetamines: NOT DETECTED
Barbiturates: NOT DETECTED
Benzodiazepines: NOT DETECTED
Cocaine: POSITIVE — AB
Opiates: NOT DETECTED
Tetrahydrocannabinol: NOT DETECTED

## 2013-12-20 LAB — DIFFERENTIAL
Basophils Absolute: 0 10*3/uL (ref 0.0–0.1)
Basophils Relative: 0 % (ref 0–1)
Eosinophils Absolute: 0.1 10*3/uL (ref 0.0–0.7)
Eosinophils Relative: 2 % (ref 0–5)
Lymphocytes Relative: 61 % — ABNORMAL HIGH (ref 12–46)
Lymphs Abs: 2.4 10*3/uL (ref 0.7–4.0)
Monocytes Absolute: 0.4 10*3/uL (ref 0.1–1.0)
Monocytes Relative: 9 % (ref 3–12)
Neutro Abs: 1.1 10*3/uL — ABNORMAL LOW (ref 1.7–7.7)
Neutrophils Relative %: 28 % — ABNORMAL LOW (ref 43–77)

## 2013-12-20 LAB — LIPID PANEL
Cholesterol: 167 mg/dL (ref 0–200)
HDL: 61 mg/dL (ref >39)
LDL Cholesterol: 88 mg/dL (ref 0–99)
Total CHOL/HDL Ratio: 2.7 RATIO
Triglycerides: 88 mg/dL (ref <150)
VLDL: 18 mg/dL (ref 0–40)

## 2013-12-20 LAB — CBC
HCT: 39 % (ref 39.0–52.0)
Hemoglobin: 12.9 g/dL — ABNORMAL LOW (ref 13.0–17.0)
MCH: 30.2 pg (ref 26.0–34.0)
MCHC: 33.1 g/dL (ref 30.0–36.0)
MCV: 91.3 fL (ref 78.0–100.0)
Platelets: 145 10*3/uL — ABNORMAL LOW (ref 150–400)
RBC: 4.27 MIL/uL (ref 4.22–5.81)
RDW: 13.2 % (ref 11.5–15.5)
WBC: 4 10*3/uL (ref 4.0–10.5)

## 2013-12-20 LAB — APTT: aPTT: 26 seconds (ref 24–37)

## 2013-12-20 LAB — HEMOGLOBIN A1C
Hgb A1c MFr Bld: 5.9 % — ABNORMAL HIGH (ref <5.7)
Mean Plasma Glucose: 123 mg/dL — ABNORMAL HIGH (ref <117)

## 2013-12-20 LAB — I-STAT TROPONIN, ED: Troponin i, poc: 0 ng/mL (ref 0.00–0.08)

## 2013-12-20 LAB — URINALYSIS, ROUTINE W REFLEX MICROSCOPIC
Bilirubin Urine: NEGATIVE
Glucose, UA: NEGATIVE mg/dL
Ketones, ur: NEGATIVE mg/dL
Leukocytes, UA: NEGATIVE
Nitrite: NEGATIVE
Protein, ur: NEGATIVE mg/dL
Specific Gravity, Urine: 1.028 (ref 1.005–1.030)
Urobilinogen, UA: 0.2 mg/dL (ref 0.0–1.0)
pH: 6 (ref 5.0–8.0)

## 2013-12-20 LAB — TROPONIN I
Troponin I: <0.3 ng/mL (ref <0.30)
Troponin I: <0.3 ng/mL (ref <0.30)
Troponin I: <0.3 ng/mL (ref <0.30)

## 2013-12-20 LAB — TSH: TSH: 2.54 u[IU]/mL (ref 0.350–4.500)

## 2013-12-20 LAB — URINE MICROSCOPIC-ADD ON

## 2013-12-20 LAB — ETHANOL: Alcohol, Ethyl (B): <11 mg/dL (ref 0–11)

## 2013-12-20 MED ORDER — ASPIRIN 325 MG PO TABS
325.0000 mg | ORAL_TABLET | Freq: Once | ORAL | Status: AC
  Administered 2013-12-20: 325 mg via ORAL
  Filled 2013-12-20: qty 1

## 2013-12-20 MED ORDER — SENNOSIDES-DOCUSATE SODIUM 8.6-50 MG PO TABS
1.0000 | ORAL_TABLET | Freq: Every evening | ORAL | Status: DC | PRN
  Filled 2013-12-20: qty 1

## 2013-12-20 MED ORDER — METOCLOPRAMIDE HCL 5 MG/ML IJ SOLN
10.0000 mg | Freq: Once | INTRAMUSCULAR | Status: AC
  Administered 2013-12-20: 10 mg via INTRAVENOUS
  Filled 2013-12-20: qty 2

## 2013-12-20 MED ORDER — LORAZEPAM 2 MG/ML IJ SOLN
1.0000 mg | Freq: Once | INTRAMUSCULAR | Status: AC
  Administered 2013-12-20: 1 mg via INTRAVENOUS
  Filled 2013-12-20: qty 1

## 2013-12-20 MED ORDER — SODIUM CHLORIDE 0.9 % IV SOLN
INTRAVENOUS | Status: AC
  Administered 2013-12-20: 16:00:00 via INTRAVENOUS

## 2013-12-20 MED ORDER — PROCHLORPERAZINE EDISYLATE 5 MG/ML IJ SOLN: 10.0000 mg | Freq: Once | INTRAMUSCULAR | Status: DC

## 2013-12-20 MED ORDER — FENTANYL CITRATE 0.05 MG/ML IJ SOLN
100.0000 ug | Freq: Once | INTRAMUSCULAR | Status: AC
  Administered 2013-12-20: 100 ug via INTRAVENOUS
  Filled 2013-12-20: qty 2

## 2013-12-20 MED ORDER — ELVITEG-COBIC-EMTRICIT-TENOFDF 150-150-200-300 MG PO TABS
1.0000 | ORAL_TABLET | Freq: Every day | ORAL | Status: DC
  Administered 2013-12-21 – 2013-12-22 (×2): 1 via ORAL
  Filled 2013-12-20 (×3): qty 1

## 2013-12-20 MED ORDER — DIPHENHYDRAMINE HCL 50 MG/ML IJ SOLN
25.0000 mg | Freq: Once | INTRAMUSCULAR | Status: AC
  Administered 2013-12-20: 25 mg via INTRAVENOUS
  Filled 2013-12-20: qty 1

## 2013-12-20 MED ORDER — KETOROLAC TROMETHAMINE 30 MG/ML IJ SOLN: 30.0000 mg | Freq: Once | INTRAMUSCULAR | Status: DC

## 2013-12-20 MED ORDER — STROKE: EARLY STAGES OF RECOVERY BOOK
Freq: Once | Status: AC
  Administered 2013-12-20: 12:00:00
  Filled 2013-12-20: qty 1

## 2013-12-20 NOTE — ED Notes (Addendum)
Presents with migraine-pt with hx of same-typical migraine for patient. Endorses light and sound sensitivity, headache began 2 hours ago. Denies nausea and vomiting. Alert, oriented and MAE x4. Headache is behind eyes and more on left side associated with feeling weak. Reports Left arm numbness began 2 days ago, left arm numbness is constant, CMS intact. No drift, no droop.

## 2013-12-20 NOTE — ED Notes (Signed)
Neuro PA at bedside.  

## 2013-12-20 NOTE — ED Notes (Signed)
Informed pt a urine sample is needed and to press call bell to let staff know when he needs to urinate.

## 2013-12-20 NOTE — Progress Notes (Signed)
Utilization review completed.  

## 2013-12-20 NOTE — Progress Notes (Signed)
VASCULAR LAB PRELIMINARY  PRELIMINARY  PRELIMINARY  PRELIMINARY  Carotid duplex  completed.    Preliminary report:  Bilateral:  1-39% ICA stenosis.  Vertebral artery flow is antegrade.      Sergio Zawislak, RVT 12/20/2013, 2:10 PM

## 2013-12-20 NOTE — ED Provider Notes (Signed)
CSN: [id number]     Arrival date & time 12/20/13  0208 History   First MD Initiated Contact with Patient 12/20/13 [phone]     Chief Complaint  Patient presents with  . Migraine     (Consider location/radiation/quality/duration/timing/severity/associated sxs/prior Treatment) HPI LKW 2 days ago ~noon sudden left arm and leg mild weak and numb persistent since then; couple hours PTA gradual left throbbing headache like prior headaches, slight blurred vision both eyes like looking through fog with headache, no other new focal neuro Sxs, no vertigo  Minimal drift left arm and leg with slight numbness; left arm and leg 4/5 motor, no nystagmus; normal F-N bilat  D/w Int Med resident for [facility] admit; [facility]  Past Medical History  Diagnosis Date  . HIV infection   . Migraine    Past Surgical History  Procedure Laterality Date  . Appendectomy  1990   Family History  Problem Relation Age of Onset  . Brain cancer Mother   . Testicular cancer Father   . Cancer Mother   . Cancer Father   . Stroke Mother   . Stroke Father   . Hypertension Father   . Diabetes Father    History  Substance Use Topics  . Smoking status: Current Every Day Smoker -- 0.30 packs/day for 30 years    Types: Cigarettes  . Smokeless tobacco: Never Used     Comment: trying to quit-wants patches  . Alcohol Use: 0.0 oz/week     Comment: occasional    Review of Systems  10 Systems reviewed and are negative for acute change except as noted in the HPI.  Allergies  Nicotine step  Home Medications   Prior to Admission medications   Medication Sig Start Date End Date Taking? Authorizing Provider  elvitegravir-cobicistat-emtricitabine-tenofovir (STRIBILD) 150-150-200-300 MG TABS tablet Take 1 tablet by mouth daily. Take with food 10/22/13  Yes Thayer Headings, MD  fluticasone [facility]) 50 MCG/ACT nasal spray Place 2 sprays into both nostrils daily. 08/23/13  Yes Thayer Headings,  MD   BP 123/83  Pulse 55  Temp(Src) 98.8 F (37.1 C) (Oral)  Resp 18  Ht 6\' 1"  (1.854 m)  Wt 198 lb 12.8 oz (90.175 kg)  BMI 26.23 kg/m2  SpO2 100% Physical Exam  Nursing note and vitals reviewed. Constitutional:  Awake, alert, nontoxic appearance with baseline speech for patient.  HENT:  Head: Atraumatic.  Mouth/Throat: No oropharyngeal exudate.  Eyes: EOM are normal. Pupils are equal, round, and reactive to light. Right eye exhibits no discharge. Left eye exhibits no discharge.  Neck: Neck supple.  Cardiovascular: Normal rate and regular rhythm.   No murmur heard. Pulmonary/Chest: Effort normal and breath sounds normal. No stridor. No respiratory distress. He has no wheezes. He has no rales. He exhibits no tenderness.  Abdominal: Soft. Bowel sounds are normal. He exhibits no mass. There is no tenderness. There is no rebound.  Musculoskeletal: He exhibits no tenderness.  Baseline ROM, moves extremities with obvious new focal weakness.  Lymphadenopathy:    He has no cervical adenopathy.  Neurological: He is alert.  Awake, alert, cooperative and aware of situation; motor strength 5/5 right 4/5 left arm and leg; sensation normal to light touch right slight decreased left arm and leg; peripheral visual fields full to confrontation; no facial asymmetry; tongue midline; major cranial nerves appear intact; minimal left arm and leg pronator drift, normal finger to nose bilaterally  Skin: No rash noted.  Psychiatric: He has  a normal mood and affect.    ED Course  Procedures (including critical care time) Labs Review Labs Reviewed  CBC - Abnormal; Notable for the following:    Hemoglobin 12.9 (*)    Platelets 145 (*)    All other components within normal limits  DIFFERENTIAL - Abnormal; Notable for the following:    Neutrophils Relative % 28 (*)    Neutro Abs 1.1 (*)    Lymphocytes Relative 61 (*)    All other components within normal limits  COMPREHENSIVE METABOLIC PANEL -  Abnormal; Notable for the following:    Total Bilirubin <0.2 (*)    GFR calc non Af Amer 69 (*)    GFR calc Af Amer 80 (*)    All other components within normal limits  URINE RAPID DRUG SCREEN (HOSP PERFORMED) - Abnormal; Notable for the following:    Cocaine POSITIVE (*)    All other components within normal limits  URINALYSIS, ROUTINE W REFLEX MICROSCOPIC - Abnormal; Notable for the following:    Hgb urine dipstick TRACE (*)    All other components within normal limits  HEMOGLOBIN A1C - Abnormal; Notable for the following:    Hemoglobin A1C 5.9 (*)    Mean Plasma Glucose 123 (*)    All other components within normal limits  CBC - Abnormal; Notable for the following:    RBC 4.17 (*)    Hemoglobin 12.4 (*)    HCT 37.4 (*)    All other components within normal limits  ETHANOL  PROTIME-INR  APTT  LIPID PANEL  TROPONIN I  TROPONIN I  TROPONIN I  TSH  URINE MICROSCOPIC-ADD ON  Randolm Idol, ED    Imaging Review Dg Chest 2 View  12/20/2013   CLINICAL DATA:  Stroke.  EXAM: CHEST - 2 VIEW  COMPARISON:  01/03/2013  FINDINGS: The heart size and mediastinal contours are within normal limits. Mild bibasilar atelectasis present. There is no evidence of pulmonary edema, consolidation, pneumothorax, nodule or pleural fluid. The visualized skeletal structures are unremarkable.  IMPRESSION: No active disease.   Electronically Signed   By: Aletta Edouard M.D.   On: 12/20/2013 09:21   Ct Head Wo Contrast  12/20/2013   CLINICAL DATA:  Migraine headache.  EXAM: CT HEAD WITHOUT CONTRAST  TECHNIQUE: Contiguous axial images were obtained from the base of the skull through the vertex without intravenous contrast.  COMPARISON:  CT of the head Sep 29, 2012  FINDINGS: The ventricles and sulci are normal. No intraparenchymal hemorrhage, mass effect nor midline shift. No acute large vascular territory infarcts.  No abnormal extra-axial fluid collections. Basal cisterns are patent.  No skull fracture. The  included ocular globes and orbital contents are non-suspicious. Small left maxillary and left ethmoid mucosal retention cysts without paranasal sinus air-fluid levels. The mastoid air cells are well aerated.  IMPRESSION: No acute intracranial process.  Normal noncontrast CT of the head.   Electronically Signed   By: Elon Alas   On: 12/20/2013 06:17   [name] Contrast  12/20/2013   CLINICAL DATA:  HIV. New onset headache and left-sided weakness. The weakness began 2 days ago.  The examination had to be discontinued prior to completion due to patient refusal for further imaging.  EXAM: MRI HEAD WITHOUT CONTRAST  MRA HEAD WITHOUT CONTRAST  TECHNIQUE: Multiplanar, multiecho pulse sequences of the brain and surrounding structures were obtained without intravenous contrast. Angiographic images of the head were obtained using MRA technique without contrast.  COMPARISON:  CT head from the same day.  FINDINGS: MRI HEAD FINDINGS  The diffusion-weighted images demonstrate no evidence for acute or subacute infarction. Only knee diffusion-weighted imaging and axial T1 weighted imaging was performed before the patient discontinued the exam.  No hemorrhage or mass lesion is present. The ventricles are of normal size. No significant extra-axial fluid collection is present.  MRA HEAD FINDINGS  The time-of-flight images are moderately distorted by patient motion. This limits evaluation of media min small vessels.  Internal carotid arteries are patent bilaterally. There is no significant focal stenosis. The A1 and M1 segments could be within normal limits. The MCA bifurcations are intact. The proximal MCA branch vessels are normal.  The vertebral arteries are codominant. The PICA origins are visualized and normal. The basilar artery is intact. Both posterior cerebral arteries originate from the basilar tip.  IMPRESSION: 1. No evidence for acute infarct. 2. The study was discontinued by the patient and several sequences  were not performed. The sequences which were performed are normal. 3. No significant proximal stenosis, aneurysm, or branch vessel occlusion. 4. The MRA is severely degraded by patient motion, limiting evaluation of media min small vessels.   Electronically Signed   By: Lawrence Santiago M.D.   On: 12/20/2013 09:23   Mr Cervical Spine Wo Contrast  12/20/2013   CLINICAL DATA:  Tingling in the left arm and hand. Decreased sensation in the C8 nerve distribution. Headache.  EXAM: MRI CERVICAL SPINE WITHOUT CONTRAST  TECHNIQUE: Multiplanar, multisequence MR imaging of the cervical spine was performed. No intravenous contrast was administered.  COMPARISON:  None.  FINDINGS: The visualized intracranial contents since paraspinal soft tissues are normal. Cervical spinal cord is normal with no mass lesion or myelopathy or spinal cord compression.  C1-2 through C3-4:  Normal.  C4-5: Disc space narrowing with degenerative changes of the endplates with with small broad-based osteophytes which slightly narrow the a lateral recesses and neural foramina bilaterally. There is edema in the endplates and in the disc asymmetric to the left. However, there is no focal neural impingement.  C5-6: Minimal disc degeneration. Minimal osteophytes to the right of midline with no impingement.  C6-7:  Tiny central disc bulge with no neural impingement.  C7-T1 and T1-2: Normal. Specifically, no evidence of impingement upon the C8 nerves.  IMPRESSION: Degenerative disc disease primarily at C4-5 with slight narrowing of both lateral recesses at that level.   Electronically Signed   By: Rozetta Nunnery M.D.   On: 12/20/2013 13:38   Mr Jodene Nam Head/brain Wo Cm  12/20/2013   CLINICAL DATA:  HIV. New onset headache and left-sided weakness. The weakness began 2 days ago.  The examination had to be discontinued prior to completion due to patient refusal for further imaging.  EXAM: MRI HEAD WITHOUT CONTRAST  MRA HEAD WITHOUT CONTRAST  TECHNIQUE: Multiplanar,  multiecho pulse sequences of the brain and surrounding structures were obtained without intravenous contrast. Angiographic images of the head were obtained using MRA technique without contrast.  COMPARISON:  CT head from the same day.  FINDINGS: MRI HEAD FINDINGS  The diffusion-weighted images demonstrate no evidence for acute or subacute infarction. Only knee diffusion-weighted imaging and axial T1 weighted imaging was performed before the patient discontinued the exam.  No hemorrhage or mass lesion is present. The ventricles are of normal size. No significant extra-axial fluid collection is present.  MRA HEAD FINDINGS  The time-of-flight images are moderately distorted by patient motion. This limits evaluation of media min small vessels.  Internal carotid arteries are patent bilaterally. There is no significant focal stenosis. The A1 and M1 segments could be within normal limits. The MCA bifurcations are intact. The proximal MCA branch vessels are normal.  The vertebral arteries are codominant. The PICA origins are visualized and normal. The basilar artery is intact. Both posterior cerebral arteries originate from the basilar tip.  IMPRESSION: 1. No evidence for acute infarct. 2. The study was discontinued by the patient and several sequences were not performed. The sequences which were performed are normal. 3. No significant proximal stenosis, aneurysm, or branch vessel occlusion. 4. The MRA is severely degraded by patient motion, limiting evaluation of media min small vessels.   Electronically Signed   By: Lawrence Santiago M.D.   On: 12/20/2013 09:23     EKG Interpretation   Date/Time:  Friday December 20 2013 08:19:37 EDT Ventricular Rate:  52 PR Interval:  161 QRS Duration: 95 QT Interval:  456 QTC Calculation: 424 R Axis:   62 Text Interpretation:  Sinus rhythm Atrial premature complexes in couplets  Baseline wander in lead(s) I aVR Confirmed by WARD,  DO, KRISTEN ([id number])  on 12/20/2013 8:25:45 AM       MDM   Final diagnoses:  Stroke  Acute nonintractable headache, unspecified headache type    Patient / Family / Caregiver informed of clinical course, understand medical decision-making process, and agree with plan. Not Code Stroke since Sxs present 2 days. The patient appears reasonably stabilized for admission considering the current resources, flow, and capabilities available in the ED at this time, and I doubt any other [facility] [address] requiring further screening and/or treatment in the ED prior to admission. Coding    Babette Relic, MD 12/21/13 [phone]

## 2013-12-20 NOTE — H&P (Signed)
Internal Medicine Attending Admission Note Date: 12/20/2013  Patient name: John Bullock Medical record number: [id number] Date of birth: [birth year] Age: 53 y.o. Gender: male  I saw and evaluated the patient. I reviewed the resident's note and I agree with the resident's findings and plan as documented in the resident's note, with the following additional comments.  Chief Complaint(s): Left sided weakness/numbness, headache  History - key components related to admission: Patient is a 53 year old man with HIV, history of migraine headache, and other problems as outlined in the medical history admitted with complaint of left-sided weakness/numbness and headache.   Physical Exam - key components related to admission:  Filed Vitals:   12/20/13 0730 12/20/13 0921 12/20/13 1031 12/20/13 1230  BP: 110/67 137/89 128/67 100/62  Pulse: 56 50 48 53  Temp:   97.9 F (36.6 C) 98.3 F (36.8 C)  TempSrc:   Oral Oral  Resp: 16  18 16   Height:   6\' 1"  (1.854 m)   Weight:   198 lb 12.8 oz (90.175 kg)   SpO2: 98% 99% 98% 96%    General: Drowsy but arousable (patient received some sedating meds in the ED and for MRI), cooperates with exam Neck: Supple Lungs: Clear Heart: Regular; no extra sounds or murmurs Abdomen: Bowel sounds present, soft, nontender Extremities: No edema Neurologic: Alert; cranial nerves II through XII intact; question mild weakness of left grip and left ankle dorsiflexion/plantar flexion   Lab results:   Basic Metabolic Panel:  Recent Labs  12/20/13 0601  NA 145  K 4.1  CL 111  CO2 23  GLUCOSE 95  BUN 20  CREATININE 1.18  CALCIUM 8.6    Liver Function Tests:  Recent Labs  12/20/13 0601  AST 15  ALT 6  ALKPHOS 72  BILITOT <0.2*  PROT 6.2  ALBUMIN 3.5     CBC:  Recent Labs  12/20/13 0601  WBC 4.0  HGB 12.9*  HCT 39.0  MCV 91.3  PLT 145*    Recent Labs  12/20/13 0601  NEUTROABS 1.1*  LYMPHSABS 2.4  MONOABS 0.4  EOSABS 0.1  BASOSABS  0.0    Cardiac Enzymes:  Recent Labs  12/20/13 0950 12/20/13 1455  TROPONINI <0.30 <0.30     Hemoglobin A1C:  Recent Labs  12/20/13 0750  HGBA1C 5.9*   Fasting Lipid Panel:  Recent Labs  12/20/13 0823  CHOL 167  HDL 61  LDLCALC 88  TRIG 88  CHOLHDL 2.7    Thyroid Function Tests:  Recent Labs  12/20/13 0950  TSH 2.540     Coagulation:  Recent Labs  12/20/13 0601  INR 0.97    Urine Drug Screen: Drugs of Abuse     Component Value Date/Time   LABOPIA NONE DETECTED 12/20/2013 0826   COCAINSCRNUR POSITIVE* 12/20/2013 0826   LABBENZ NONE DETECTED 12/20/2013 0826   AMPHETMU NONE DETECTED 12/20/2013 0826   THCU NONE DETECTED 12/20/2013 0826   LABBARB NONE DETECTED 12/20/2013 0826     Alcohol Level:  Recent Labs  12/20/13 0601  ETH <11    Urinalysis    Component Value Date/Time   COLORURINE YELLOW 12/20/2013 0826   APPEARANCEUR CLEAR 12/20/2013 0826   LABSPEC 1.028 12/20/2013 0826   PHURINE 6.0 12/20/2013 0826   GLUCOSEU NEGATIVE 12/20/2013 0826   HGBUR TRACE* 12/20/2013 0826   BILIRUBINUR NEGATIVE 12/20/2013 0826   KETONESUR NEGATIVE 12/20/2013 0826   PROTEINUR NEGATIVE 12/20/2013 0826   UROBILINOGEN 0.2 12/20/2013 0826   NITRITE NEGATIVE 12/20/2013 0347  LEUKOCYTESUR NEGATIVE 12/20/2013 0826    Urine microscopic:  Recent Labs  12/20/13 0826  WBCU 0-2  RBCU 0-2  BACTERIA RARE  OTHERU MUCOUS PRESENT    Lab Results  Component Value Date   CD4TABS 630 12/10/2013   HIV1RNAQUANT <20 12/10/2013     Imaging results:  Dg Chest 2 View  12/20/2013   CLINICAL DATA:  Stroke.  EXAM: CHEST - 2 VIEW  COMPARISON:  01/03/2013  FINDINGS: The heart size and mediastinal contours are within normal limits. Mild bibasilar atelectasis present. There is no evidence of pulmonary edema, consolidation, pneumothorax, nodule or pleural fluid. The visualized skeletal structures are unremarkable.  IMPRESSION: No active disease.   Electronically Signed   By: Aletta Edouard M.D.   On:  12/20/2013 09:21   Ct Head Wo Contrast  12/20/2013   CLINICAL DATA:  Migraine headache.  EXAM: CT HEAD WITHOUT CONTRAST  TECHNIQUE: Contiguous axial images were obtained from the base of the skull through the vertex without intravenous contrast.  COMPARISON:  CT of the head Sep 29, 2012  FINDINGS: The ventricles and sulci are normal. No intraparenchymal hemorrhage, mass effect nor midline shift. No acute large vascular territory infarcts.  No abnormal extra-axial fluid collections. Basal cisterns are patent.  No skull fracture. The included ocular globes and orbital contents are non-suspicious. Small left maxillary and left ethmoid mucosal retention cysts without paranasal sinus air-fluid levels. The mastoid air cells are well aerated.  IMPRESSION: No acute intracranial process.  Normal noncontrast CT of the head.   Electronically Signed   By: Elon Alas   On: 12/20/2013 06:17   [name] Contrast  12/20/2013   CLINICAL DATA:  HIV. New onset headache and left-sided weakness. The weakness began 2 days ago.  The examination had to be discontinued prior to completion due to patient refusal for further imaging.  EXAM: MRI HEAD WITHOUT CONTRAST  MRA HEAD WITHOUT CONTRAST  TECHNIQUE: Multiplanar, multiecho pulse sequences of the brain and surrounding structures were obtained without intravenous contrast. Angiographic images of the head were obtained using MRA technique without contrast.  COMPARISON:  CT head from the same day.  FINDINGS: MRI HEAD FINDINGS  The diffusion-weighted images demonstrate no evidence for acute or subacute infarction. Only knee diffusion-weighted imaging and axial T1 weighted imaging was performed before the patient discontinued the exam.  No hemorrhage or mass lesion is present. The ventricles are of normal size. No significant extra-axial fluid collection is present.  MRA HEAD FINDINGS  The time-of-flight images are moderately distorted by patient motion. This limits evaluation of  media min small vessels.  Internal carotid arteries are patent bilaterally. There is no significant focal stenosis. The A1 and M1 segments could be within normal limits. The MCA bifurcations are intact. The proximal MCA branch vessels are normal.  The vertebral arteries are codominant. The PICA origins are visualized and normal. The basilar artery is intact. Both posterior cerebral arteries originate from the basilar tip.  IMPRESSION: 1. No evidence for acute infarct. 2. The study was discontinued by the patient and several sequences were not performed. The sequences which were performed are normal. 3. No significant proximal stenosis, aneurysm, or branch vessel occlusion. 4. The MRA is severely degraded by patient motion, limiting evaluation of media min small vessels.   Electronically Signed   By: Lawrence Santiago M.D.   On: 12/20/2013 09:23   Mr Cervical Spine Wo Contrast  12/20/2013   CLINICAL DATA:  Tingling in the left arm and  hand. Decreased sensation in the C8 nerve distribution. Headache.  EXAM: MRI CERVICAL SPINE WITHOUT CONTRAST  TECHNIQUE: Multiplanar, multisequence MR imaging of the cervical spine was performed. No intravenous contrast was administered.  COMPARISON:  None.  FINDINGS: The visualized intracranial contents since paraspinal soft tissues are normal. Cervical spinal cord is normal with no mass lesion or myelopathy or spinal cord compression.  C1-2 through C3-4:  Normal.  C4-5: Disc space narrowing with degenerative changes of the endplates with with small broad-based osteophytes which slightly narrow the a lateral recesses and neural foramina bilaterally. There is edema in the endplates and in the disc asymmetric to the left. However, there is no focal neural impingement.  C5-6: Minimal disc degeneration. Minimal osteophytes to the right of midline with no impingement.  C6-7:  Tiny central disc bulge with no neural impingement.  C7-T1 and T1-2: Normal. Specifically, no evidence of impingement  upon the C8 nerves.  IMPRESSION: Degenerative disc disease primarily at C4-5 with slight narrowing of both lateral recesses at that level.   Electronically Signed   By: Rozetta Nunnery M.D.   On: 12/20/2013 13:38   Mr Jodene Nam Head/brain Wo Cm  12/20/2013   CLINICAL DATA:  HIV. New onset headache and left-sided weakness. The weakness began 2 days ago.  The examination had to be discontinued prior to completion due to patient refusal for further imaging.  EXAM: MRI HEAD WITHOUT CONTRAST  MRA HEAD WITHOUT CONTRAST  TECHNIQUE: Multiplanar, multiecho pulse sequences of the brain and surrounding structures were obtained without intravenous contrast. Angiographic images of the head were obtained using MRA technique without contrast.  COMPARISON:  CT head from the same day.  FINDINGS: MRI HEAD FINDINGS  The diffusion-weighted images demonstrate no evidence for acute or subacute infarction. Only knee diffusion-weighted imaging and axial T1 weighted imaging was performed before the patient discontinued the exam.  No hemorrhage or mass lesion is present. The ventricles are of normal size. No significant extra-axial fluid collection is present.  MRA HEAD FINDINGS  The time-of-flight images are moderately distorted by patient motion. This limits evaluation of media min small vessels.  Internal carotid arteries are patent bilaterally. There is no significant focal stenosis. The A1 and M1 segments could be within normal limits. The MCA bifurcations are intact. The proximal MCA branch vessels are normal.  The vertebral arteries are codominant. The PICA origins are visualized and normal. The basilar artery is intact. Both posterior cerebral arteries originate from the basilar tip.  IMPRESSION: 1. No evidence for acute infarct. 2. The study was discontinued by the patient and several sequences were not performed. The sequences which were performed are normal. 3. No significant proximal stenosis, aneurysm, or branch vessel occlusion. 4.  The MRA is severely degraded by patient motion, limiting evaluation of media min small vessels.   Electronically Signed   By: Lawrence Santiago M.D.   On: 12/20/2013 09:23    Other results: EKG: Sinus rhythm; J-point elevation suggests early repolarization  Assessment & Plan by Problem:  1.  Left-sided numbness/mild weakness.  Differential includes stroke, complicated migraine, or other problem.  The head CT scan showed no evidence of stroke; the MRI showed no evidence of stroke, although the study was discontinued by the patient prior to completion.  Neurology consult was consulted gave recommendations, and they are following patient with [place].  Carotid Doppler showed no significant ICA stenosis.  MRI of the cervical spine showed no mass lesion, myelopathy, or spinal cord compression.  Plan is await  results of 2-D echocardiogram; aspirin 325 mg daily; follow neuro status closely; monitor; serial cardiac enzymes.  2.  Headache.  Patient has a history of migraine headache, and symptoms may represent a complicated migraine.  He is afebrile without clinical signs of meningitis.  He has good viral suppression and a CD4 count of 630, which weighs against opportunistic CNS infection.  Plan is symptomatic treatment; follow neurological status as above.  3.  HIV.  As above, recent labs show good viral suppression and an adequate CD4 count of 630.  Plan is continue home antiretroviral regimen.  4.  Cocaine-positive UDS.  Urine drug screen was positive for cocaine.  This may be contributing to his symptoms.  Plan is counseled regarding abstinence.  5.  Other problems and plans as per the resident physician's note.

## 2013-12-20 NOTE — Progress Notes (Signed)
Echo Lab  2D Echocardiogram completed.  War, RDCS 12/20/2013 2:37 PM

## 2013-12-20 NOTE — H&P (Signed)
Date: 12/20/2013               Patient Name:  John Bullock MRN: [id number]  DOB: April 17, 1961 Age / Sex: 53 y.o., male   PCP: No Pcp Per Patient         Medical Service: Internal Medicine Teaching Service         Attending Physician: Dr. Babette Relic, MD    First Contact: Dr. Marvel Plan Pager: [phone]  Second Contact: Dr. Alice Rieger Pager: [phone]       After Hours (After 5p/  First Contact Pager: [phone]  weekends / holidays): Second Contact Pager: [phone]   Chief Complaint: Headache and left sided weakness  History of Present Illness: John Bullock is a 53 year old male with well controlled HIV and current smoker presenting to the ED today with complaints of headache and left sided weakness.  He reports 2 days ago (Wed 8/5) developing L sided weakness and numbness around afternoon time that has persisted up to today. He reports feeling needling tingling sensation in both upper and lower extremities on the left.  Additionally, he developed a severe throbbing headache also on the left side above the ear and blurry vision yesterday around 3-4pm. He reports hx of migraines and has similar types of headaches in the past but does feel like this one is different given the numbness in his arm and leg as well. He then called the ambulance around 11pm because things were not improving. He did not take anything at home for the headache and can usually sleep it off in the past.  In regards to blurry vision, he says it is in both eyes, slightly improved today but still present. He also feels very dizzy and light-headed and is scared to stand. He also is sensitive to light at this time that appears to be a new symptom but is able to move his neck and no tenderness to palpation.   He denies chest pain, sob, N/V/D, abdominal pain, recent trauma, taking any other medications other than his HIV medication, and no sick contacts. He is unaware of having a slow heart rate in the past. He follows with Dr. Linus Salmons for  his HIV.   He is a current smoker, up to 1/2ppd, occasionally drinks alcohol, and reports family history of strokes in both his mother and father.   Meds: Current Facility-Administered Medications  Medication Dose Route Frequency Provider Last Rate Last Dose  . aspirin tablet 325 mg  325 mg Oral Once Babette Relic, MD       Current Outpatient Prescriptions  Medication Sig Dispense Refill  . elvitegravir-cobicistat-emtricitabine-tenofovir (STRIBILD) 150-150-200-300 MG TABS tablet Take 1 tablet by mouth daily. Take with food  30 tablet  5  . fluticasone (FLONASE) 50 MCG/ACT nasal spray Place 2 sprays into both nostrils daily.  16 g  0   Allergies: Allergies as of 12/20/2013 - Review Complete 12/20/2013  Allergen Reaction Noted  . Nicotine step [nicotine] Palpitations and Rash 12/07/2010   Past Medical History  Diagnosis Date  . HIV infection   . Migraine    Past Surgical History  Procedure Laterality Date  . Appendectomy  1990   Family History  Problem Relation Age of Onset  . Brain cancer Mother   . Testicular cancer Father    History   Social History  . Marital Status: Single    Spouse Name: N/A    Number of Children: N/A  . Years of Education: N/A  Occupational History  . Not on file.   Social History Main Topics  . Smoking status: Current Every Day Smoker -- 0.30 packs/day for 30 years    Types: Cigarettes  . Smokeless tobacco: Never Used     Comment: trying to quit-wants patches  . Alcohol Use: 0.0 oz/week     Comment: occasional  . Drug Use: No  . Sexual Activity: Yes    Partners: Male     Comment: given condoms   Other Topics Concern  . Not on file   Social History Narrative  . No narrative on file   Review of Systems:  Constitutional:  Denies fever, chills. Feeling weaker than usual  HEENT:  Blurry vision, sensitivity to light  Respiratory:  Denies SOB, DOE, cough, and wheezing.   Cardiovascular:  Denies palpitations and leg swelling or chest  pain.   Gastrointestinal:  Denies nausea, vomiting, abdominal pain, diarrhea  Genitourinary:  Denies dysuria  Musculoskeletal:  Denies myalgias. Unsteady gait.   Skin:  Denies pallor, rash and wound.   Neurological:  Headache, L sided weakness, dizziness. Denies slurred speech.   Physical Exam: Blood pressure 116/74, pulse 51, temperature 97.5 F (36.4 C), temperature source Oral, resp. rate 16, weight 198 lb (89.812 kg), SpO2 96.00%. Vitals reviewed. General: resting in bed, sleepy, NAD HEENT: PERRL, EOMI but trouble following finger without moving head when looking down and left Cardiac: Bradycardia, no obvious murmurs Pulm: clear to auscultation bilaterally, no wheezes, rales, or rhonchi Abd: soft, nontender, nondistended, BS present Ext: warm and well perfused, no pedal edema, moving all 4 extremities but slower to move left arm and leg compared to right Neuro: alert and oriented X3, able to spell world backwards, no obvious facial droop but unable to puff cheeks on the left, no obvious tongue deviation, able to shrug shoulders, close eyes tightly, show teeth, sensation significantly decreased on left extremities compared to right, dizziness and feeling very unsteady on standing, unable to let go of my hand or the bed, mild drift of left hand noted when lying in bed with eyes closed with fingers curling of left hand, right hand able to stay open and flat, heel to shin difficulty with left leg but intact with right leg. Wiggling both toes. Strength decreased on left compared to right 2-3 on left with 5/5 on right.   Lab results: Basic Metabolic Panel:  Recent Labs  12/20/13 0601  NA 145  K 4.1  CL 111  CO2 23  GLUCOSE 95  BUN 20  CREATININE 1.18  CALCIUM 8.6   Liver Function Tests:  Recent Labs  12/20/13 0601  AST 15  ALT 6  ALKPHOS 72  BILITOT <0.2*  PROT 6.2  ALBUMIN 3.5   CBC:  Recent Labs  12/20/13 0601  WBC 4.0  NEUTROABS 1.1*  HGB 12.9*  HCT 39.0  MCV  91.3  PLT 145*   Cardiac Enzymes:  Recent Labs  12/20/13 0950 12/20/13 1455  TROPONINI <0.30 <0.30   Hemoglobin A1C:  Recent Labs  12/20/13 0750  HGBA1C 5.9*   Fasting Lipid Panel:  Recent Labs  12/20/13 0823  CHOL 167  HDL 61  LDLCALC 88  TRIG 88  CHOLHDL 2.7   Thyroid Function Tests:  Recent Labs  12/20/13 0950  TSH 2.540   Coagulation:  Recent Labs  12/20/13 0601  LABPROT 12.9  INR 0.97   Urine Drug Screen: Drugs of Abuse     Component Value Date/Time   LABOPIA NONE DETECTED 12/20/2013  5852    Alcohol Level:  Recent Labs  12/20/13 0601  ETH <11   Urinalysis:  Recent Labs  12/20/13 0826  COLORURINE YELLOW  LABSPEC 1.028  PHURINE 6.0  GLUCOSEU NEGATIVE  HGBUR TRACE*  BILIRUBINUR NEGATIVE  KETONESUR NEGATIVE  PROTEINUR NEGATIVE  UROBILINOGEN 0.2  NITRITE NEGATIVE  LEUKOCYTESUR NEGATIVE   Imaging results:  Ct Head Wo Contrast  12/20/2013   CLINICAL DATA:  Migraine headache.  EXAM: CT HEAD WITHOUT CONTRAST  TECHNIQUE: Contiguous axial images were obtained from the base of the skull through the vertex without intravenous contrast.  COMPARISON:  CT of the head Sep 29, 2012  FINDINGS: The ventricles and sulci are normal. No intraparenchymal hemorrhage, mass effect nor midline shift. No acute large vascular territory infarcts.  No abnormal extra-axial fluid collections. Basal cisterns are patent.  No skull fracture. The included ocular globes and orbital contents are non-suspicious. Small left maxillary and left ethmoid mucosal retention cysts without paranasal sinus air-fluid levels. The mastoid air cells are well aerated.  IMPRESSION: No acute intracranial process.  Normal noncontrast CT of the head.   Electronically Signed   By: Elon Alas   On: 12/20/2013 06:17   Other results: EKG: repeated in ER: HR 52, sinus bradycardia, ?q waves in inferolateral waves with baseline wander, ?atrial premature complexes; repol seen on first  EKG.  Assessment & Plan by Problem: Principal Problem:   Left-sided weakness Active Problems:   HIV disease   Tobacco abuse   Headache Mr. Gubbels is a 53 year old male with HIV and current smoker admitted for ?CVA.   Left sided weakness with headache--concern for possible CVA and possibly TIA if symptoms resolve. CT head negative, however, continues to have numbness and significantly decreased weakness and sensation on left compared to right. Headache slightly improved after migraine cocktail, but still having some blurry vision. Hemiplegic migraine and vestibular migraine are also included in the differentials if stroke work up is negative given constellation of symptoms. While he denies any obvious aura he does have vision disturbance and mild photophobia. If this is a TIA his ABCD2 score would be 4 given duration of symptoms (>24 hours) making him moderate risk for possible stroke risk. Meningitis seems less likely as he is not febrile, no leukocytosis, no chills (only when covers are off and he is not wearing a shirt), full ROM of neck; but does have mild photophobia.  Finally, although seems unlikely, symptomatic bradycardia could account for his generalized weakness; however his HR has been in the 60s on prior hospital and office visits and today is relatively stable in 60's but does occasionally drop to 40s.  -admit to tele -Dr. Stevie Kern called neurohospitalist who will consult later today -MRI/MRA -Risk stratify: HbA1C, Lipid panel, TSH -Will cycle CE -passed swallow screen in ER, will advance diet to regular -PT/OT, ?SLP -U/A, UDS -CXR -Echo, Carotid dopplers -ASA 325mg  already received -Received fentanyl, reglan and benadryl in ER for headache that has made him drowsy, will hold on further sedating medications in the meantime for neuro evaluation -frequent neuro checks -EKG in AM -CBC in AM  HIV disease--well controlled. Follows with Dr. Linus Salmons. Last viral load undetectable and CD4  count 630 in July 2015. On Stribild at home.  -continue Stribild  Current smoker--1/2ppd for >10 years. -cessation strongly encouraged and counseling provided. 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  Cr appears to be at baseline ~1.1 with GFR on 80 on admission with Cr of 1.18.  Diet: Regular DVT Ppx: SCDs for now, transition likely to Lovenox if scans negative Dispo: Disposition is deferred at this time, awaiting improvement of current medical problems. Anticipated discharge in approximately 1-2 day(s).   The patient does not have a current PCP (No Pcp Per Patient) and does not need an OPC but will need PCP establishment per his choice for hospital follow-up appointment after discharge.  The patient does not have transportation limitations that hinder transportation to clinic appointments.  Signed: Wilber Oliphant, MD 12/20/2013, 7:42 AM

## 2013-12-20 NOTE — Consult Note (Signed)
NEURO HOSPITALIST CONSULT NOTE    Reason for Consult:left arm and leg decreased sensation associated with HA  HPI:                                                                                                                                          John Bullock is an 53 y.o. male With HIV and Migraine HA. patient states 2 days ago he noted his left hand (small finger, ring finger and middle finger) extending up the lateral aspect of his arm and then medial aspect of upper arm felt "tingly".  In addition his lateral aspect of his left foot and back of his leg felt tingly. HE did not seek medical attention.  Yesterday at 11 PM he woke up due to a sever throbbing HA located on the left aspect of his head. Upon waking he noted some "blurred vision throughout all fields".  This worried him and he came to ED.  Currently he still has a 7/10 HA, resting comfortably, states his left arm and leg have not changed. MRI obtained while in ED was negative for acute CVA but all images were not able to be obtained due to patient not able to finish exam.   WBC normal Afebrile UDS positive for cocaine  Past Medical History  Diagnosis Date  . HIV infection   . Migraine     Past Surgical History  Procedure Laterality Date  . Appendectomy  1990    Family History  Problem Relation Age of Onset  . Brain cancer Mother   . Testicular cancer Father   . Cancer Mother   . Cancer Father   . Stroke Mother   . Stroke Father   . Hypertension Father   . Diabetes Father      Social History:  reports that he has been smoking Cigarettes.  He has a 9 pack-year smoking history. He has never used smokeless tobacco. He reports that he drinks alcohol. He reports that he does not use illicit drugs.  Allergies  Allergen Reactions  . Nicotine Step [Nicotine] Palpitations and Rash    Smoker's patch    MEDICATIONS:  No current facility-administered medications for this encounter.   Current Outpatient Prescriptions  Medication Sig Dispense Refill  . elvitegravir-cobicistat-emtricitabine-tenofovir (STRIBILD) 150-150-200-300 MG TABS tablet Take 1 tablet by mouth daily. Take with food  30 tablet  5  . fluticasone (FLONASE) 50 MCG/ACT nasal spray Place 2 sprays into both nostrils daily.  16 g  0      ROS:                                                                                                                                       History obtained from the patient  General ROS: negative for - chills, fatigue, fever, night sweats, weight gain or weight loss Psychological ROS: negative for - behavioral disorder, hallucinations, memory difficulties, mood swings or suicidal ideation Ophthalmic ROS: negative for - blurry vision, double vision, eye pain or loss of vision ENT ROS: negative for - epistaxis, nasal discharge, oral lesions, sore throat, tinnitus or vertigo Allergy and Immunology ROS: negative for - hives or itchy/watery eyes Hematological and Lymphatic ROS: negative for - bleeding problems, bruising or swollen lymph nodes Endocrine ROS: negative for - galactorrhea, hair pattern changes, polydipsia/polyuria or temperature intolerance Respiratory ROS: negative for - cough, hemoptysis, shortness of breath or wheezing Cardiovascular ROS: negative for - chest pain, dyspnea on exertion, edema or irregular heartbeat Gastrointestinal ROS: negative for - abdominal pain, diarrhea, hematemesis, nausea/vomiting or stool incontinence Genito-Urinary ROS: negative for - dysuria, hematuria, incontinence or urinary frequency/urgency Musculoskeletal ROS: negative for - joint swelling or muscular weakness Neurological ROS: as noted in HPI Dermatological ROS: negative for rash and skin lesion changes   Blood pressure 137/89, pulse 50, temperature 97.5 F (36.4 C),  temperature source Oral, resp. rate 16, weight 89.812 kg (198 lb), SpO2 99.00%.   Neurologic Examination:                                                                                                      General: NAD Mental Status: Alert, oriented, thought content appropriate.  Speech fluent without evidence of aphasia.  Able to follow 3 step commands without difficulty. Cranial Nerves: II: Discs flat bilaterally; Visual fields grossly normal, pupils equal, round, reactive to light and accommodation III,IV, VI: ptosis not present, extra-ocular motions intact bilaterally V,VII: smile symmetric, facial light touch sensation normal bilaterally VIII: hearing normal bilaterally IX,X: gag reflex present XI: bilateral shoulder shrug XII: midline tongue extension without atrophy or fasciculations  Motor: Right : Upper extremity   5/5  Left:     Upper extremity   5/5 --with mild APB weakness  Lower extremity   5/5     Lower extremity   5/5 Tone and bulk:normal tone throughout; no atrophy noted Sensory: Pinprick and light touch decreased in the C8 distribution on left hand and S1 distribution of left foot.  Deep Tendon Reflexes:  Right: Upper Extremity   Left: Upper extremity   biceps (C-5 to C-6) 2/4   biceps (C-5 to C-6) 2/4 tricep (C7) 1/4    triceps (C7) 1/4 Brachioradialis (C6) 2/4  Brachioradialis (C6) 2/4  Lower Extremity Lower Extremity  quadriceps (L-2 to L-4) 1/4   quadriceps (L-2 to L-4) 1/4 Achilles (S1) 0/4   Achilles (S1) 0/4  Plantars: Right: downgoing   Left: downgoing Cerebellar: normal finger-to-nose,  normal heel-to-shin test Gait: not tested.  CV: pulses palpable throughout    Lab Results: Basic Metabolic Panel:  Recent Labs Lab 12/20/13 0601  NA 145  K 4.1  CL 111  CO2 23  GLUCOSE 95  BUN 20  CREATININE 1.18  CALCIUM 8.6    Liver Function Tests:  Recent Labs Lab 12/20/13 0601  AST 15  ALT 6  ALKPHOS 72  BILITOT <0.2*  PROT 6.2  ALBUMIN  3.5   No results found for this basename: LIPASE, AMYLASE,  in the last 168 hours No results found for this basename: AMMONIA,  in the last 168 hours  CBC:  Recent Labs Lab 12/20/13 0601  WBC 4.0  NEUTROABS 1.1*  HGB 12.9*  HCT 39.0  MCV 91.3  PLT 145*    Cardiac Enzymes: No results found for this basename: CKTOTAL, CKMB, CKMBINDEX, TROPONINI,  in the last 168 hours  Lipid Panel:  Recent Labs Lab 12/20/13 0823  CHOL 167  TRIG 88  HDL 61  CHOLHDL 2.7  VLDL 18  LDLCALC 88    CBG: No results found for this basename: GLUCAP,  in the last 168 hours  Microbiology: Results for orders placed during the hospital encounter of 06/28/13  URINE CULTURE     Status: None   Collection Time    06/28/13 11:44 AM      Result Value Ref Range Status   Specimen Description URINE, CLEAN CATCH   Final   Special Requests NONE   Final   Culture  Setup Time     Final   Value: 06/28/2013 22:08     Performed at [place]     Final   Value: >=100,000 COLONIES/ML     Performed at [facility]   Culture     Final   Value: ESCHERICHIA COLI     Performed at [facility]   Report Status 06/30/2013 FINAL   Final   Organism ID, Bacteria ESCHERICHIA COLI   Final    Coagulation Studies:  Recent Labs  12/20/13 0601  LABPROT 12.9  INR 0.97    Imaging: Dg Chest 2 View  12/20/2013   CLINICAL DATA:  Stroke.  EXAM: CHEST - 2 VIEW  COMPARISON:  01/03/2013  FINDINGS: The heart size and mediastinal contours are within normal limits. Mild bibasilar atelectasis present. There is no evidence of pulmonary edema, consolidation, pneumothorax, nodule or pleural fluid. The visualized skeletal structures are unremarkable.  IMPRESSION: No active disease.   Electronically Signed   By: Aletta Edouard M.D.   On: 12/20/2013 09:21   Ct Head Wo Contrast  12/20/2013   CLINICAL DATA:  Migraine headache.  EXAM: CT HEAD WITHOUT CONTRAST  TECHNIQUE: Contiguous axial images  were obtained from the base of the skull through the vertex without intravenous contrast.  COMPARISON:  CT of the head Sep 29, 2012  FINDINGS: The ventricles and sulci are normal. No intraparenchymal hemorrhage, mass effect nor midline shift. No acute large vascular territory infarcts.  No abnormal extra-axial fluid collections. Basal cisterns are patent.  No skull fracture. The included ocular globes and orbital contents are non-suspicious. Small left maxillary and left ethmoid mucosal retention cysts without paranasal sinus air-fluid levels. The mastoid air cells are well aerated.  IMPRESSION: No acute intracranial process.  Normal noncontrast CT of the head.   Electronically Signed   By: Elon Alas   On: 12/20/2013 06:17   [name] Contrast  12/20/2013   CLINICAL DATA:  HIV. New onset headache and left-sided weakness. The weakness began 2 days ago.  The examination had to be discontinued prior to completion due to patient refusal for further imaging.  EXAM: MRI HEAD WITHOUT CONTRAST  MRA HEAD WITHOUT CONTRAST  TECHNIQUE: Multiplanar, multiecho pulse sequences of the brain and surrounding structures were obtained without intravenous contrast. Angiographic images of the head were obtained using MRA technique without contrast.  COMPARISON:  CT head from the same day.  FINDINGS: MRI HEAD FINDINGS  The diffusion-weighted images demonstrate no evidence for acute or subacute infarction. Only knee diffusion-weighted imaging and axial T1 weighted imaging was performed before the patient discontinued the exam.  No hemorrhage or mass lesion is present. The ventricles are of normal size. No significant extra-axial fluid collection is present.  MRA HEAD FINDINGS  The time-of-flight images are moderately distorted by patient motion. This limits evaluation of media min small vessels.  Internal carotid arteries are patent bilaterally. There is no significant focal stenosis. The A1 and M1 segments could be within  normal limits. The MCA bifurcations are intact. The proximal MCA branch vessels are normal.  The vertebral arteries are codominant. The PICA origins are visualized and normal. The basilar artery is intact. Both posterior cerebral arteries originate from the basilar tip.  IMPRESSION: 1. No evidence for acute infarct. 2. The study was discontinued by the patient and several sequences were not performed. The sequences which were performed are normal. 3. No significant proximal stenosis, aneurysm, or branch vessel occlusion. 4. The MRA is severely degraded by patient motion, limiting evaluation of media min small vessels.   Electronically Signed   By: Lawrence Santiago M.D.   On: 12/20/2013 09:23   Mr Jodene Nam Head/brain Wo Cm  12/20/2013   CLINICAL DATA:  HIV. New onset headache and left-sided weakness. The weakness began 2 days ago.  The examination had to be discontinued prior to completion due to patient refusal for further imaging.  EXAM: MRI HEAD WITHOUT CONTRAST  MRA HEAD WITHOUT CONTRAST  TECHNIQUE: Multiplanar, multiecho pulse sequences of the brain and surrounding structures were obtained without intravenous contrast. Angiographic images of the head were obtained using MRA technique without contrast.  COMPARISON:  CT head from the same day.  FINDINGS: MRI HEAD FINDINGS  The diffusion-weighted images demonstrate no evidence for acute or subacute infarction. Only knee diffusion-weighted imaging and axial T1 weighted imaging was performed before the patient discontinued the exam.  No hemorrhage or mass lesion is present. The ventricles are of normal size. No significant extra-axial fluid collection is present.  MRA HEAD FINDINGS  The time-of-flight images are moderately distorted by patient motion. This limits evaluation of media min  small vessels.  Internal carotid arteries are patent bilaterally. There is no significant focal stenosis. The A1 and M1 segments could be within normal limits. The MCA bifurcations are  intact. The proximal MCA branch vessels are normal.  The vertebral arteries are codominant. The PICA origins are visualized and normal. The basilar artery is intact. Both posterior cerebral arteries originate from the basilar tip.  IMPRESSION: 1. No evidence for acute infarct. 2. The study was discontinued by the patient and several sequences were not performed. The sequences which were performed are normal. 3. No significant proximal stenosis, aneurysm, or branch vessel occlusion. 4. The MRA is severely degraded by patient motion, limiting evaluation of media min small vessels.   Electronically Signed   By: Lawrence Santiago M.D.   On: 12/20/2013 09:23       Assessment and plan per attending neurologist  Etta Quill PA-C [facility] [phone]  12/20/2013, 9:46 AM   Assessment/Plan: 53 YO male with 2 day history of left hand and left foot decreased sensation. Today awoke with left sided throbbing HA.  Exam did show mild APB weakness on the left hand.  Patient would benefit from MRI C-spine to rule out spinal disease. Complicated migraine is a possibility.   Recommend: 1) MRI c-spine.  2) Compazine/toradol for headache.   Roland Rack, MD [facility] [phone]  If 7pm- 7am, please page neurology on call as listed in [place].

## 2013-12-21 DIAGNOSIS — I498 Other specified cardiac arrhythmias: Secondary | ICD-10-CM

## 2013-12-21 LAB — CBC
HCT: 37.4 % — ABNORMAL LOW (ref 39.0–52.0)
Hemoglobin: 12.4 g/dL — ABNORMAL LOW (ref 13.0–17.0)
MCH: 29.7 pg (ref 26.0–34.0)
MCHC: 33.2 g/dL (ref 30.0–36.0)
MCV: 89.7 fL (ref 78.0–100.0)
Platelets: 162 10*3/uL (ref 150–400)
RBC: 4.17 MIL/uL — ABNORMAL LOW (ref 4.22–5.81)
RDW: 13.1 % (ref 11.5–15.5)
WBC: 4.3 10*3/uL (ref 4.0–10.5)

## 2013-12-21 MED ORDER — PROCHLORPERAZINE EDISYLATE 5 MG/ML IJ SOLN
10.0000 mg | Freq: Once | INTRAMUSCULAR | Status: AC
  Administered 2013-12-21: 10 mg via INTRAVENOUS
  Filled 2013-12-21: qty 2

## 2013-12-21 MED ORDER — ENOXAPARIN SODIUM 40 MG/0.4ML ~~LOC~~ SOLN
40.0000 mg | Freq: Every day | SUBCUTANEOUS | Status: DC
  Administered 2013-12-21: 40 mg via SUBCUTANEOUS
  Filled 2013-12-21 (×2): qty 0.4

## 2013-12-21 MED ORDER — KETOROLAC TROMETHAMINE 30 MG/ML IJ SOLN
30.0000 mg | Freq: Once | INTRAMUSCULAR | Status: AC
  Administered 2013-12-21: 30 mg via INTRAVENOUS
  Filled 2013-12-21: qty 1

## 2013-12-21 MED ORDER — DIPHENHYDRAMINE HCL 50 MG/ML IJ SOLN
12.5000 mg | Freq: Once | INTRAMUSCULAR | Status: AC
  Administered 2013-12-21: 12.5 mg via INTRAVENOUS
  Filled 2013-12-21: qty 1

## 2013-12-21 MED ORDER — ASPIRIN 325 MG PO TABS
325.0000 mg | ORAL_TABLET | Freq: Every day | ORAL | Status: DC
  Administered 2013-12-21 – 2013-12-22 (×2): 325 mg via ORAL
  Filled 2013-12-21 (×2): qty 1

## 2013-12-21 MED ORDER — GABAPENTIN 100 MG PO CAPS
500.0000 mg | ORAL_CAPSULE | Freq: Three times a day (TID) | ORAL | Status: DC
  Administered 2013-12-21 (×3): 500 mg via ORAL
  Filled 2013-12-21 (×6): qty 1

## 2013-12-21 NOTE — Progress Notes (Signed)
Patient c/o of headache 10/10 equivalent to admission in hospital, no prns,  MD notified, vs taken, room darkened, bed flat

## 2013-12-21 NOTE — Progress Notes (Signed)
Occupational Therapy Evaluation and Discharge Patient Details Name: John Bullock MRN: [id number] DOB: Sep 08, 1960 Today's Date: 12/21/2013    History of Present Illness 53 yo male, with 2 day numbness/tingling L side, prior to severe HA and admission to ER on 8/7.  Not all imaging completed, but imaging completed is negative for CVA, patient has history of migranes.   Clinical Impression   PTA pt lived at home and was independent with ADLs and functional mobility. Pt presents with tingling and decreased sensation in LUE in radial distribution from elbow distally. Pt also demonstrates weakness of LUE. Educated pt on safety with ADLs and therapeutic exercise to increase LUE strength. Pt overall at Mod Independent level for ADLs and PT is working with pt on ambulating with cane. Acute OT to sign off.     Follow Up Recommendations  No OT follow up;Supervision - Intermittent    Equipment Recommendations  None recommended by OT       Precautions / Restrictions Precautions Precautions: Fall Precaution Comments: L side weakness presents with impaired balance. Restrictions Weight Bearing Restrictions: No      Mobility Bed Mobility Overal bed mobility: Modified Independent                Transfers Overall transfer level: Needs assistance Equipment used: None Transfers: Sit to/from Stand Sit to Stand: Supervision         General transfer comment: Pt nervous, however able to stand easily from bed with Supervision.     Balance Overall balance assessment: Needs assistance   Sitting balance-Leahy Scale: Good     Standing balance support: Bilateral upper extremity supported;During functional activity Standing balance-Leahy Scale: Fair Standing balance comment: L LE unsteady, numb/tingling sensation                            ADL Overall ADL's : Modified independent                                       General ADL Comments: Pt overall mod  independent for ADLs and educated on safety with sharp and hot objects due to decreased sensation in LUE. Education provided for fall prevention at home as well as energy conservation.      Vision  No apparent visual deficits.                    Perception Perception Perception Tested?: No   Praxis Praxis Praxis tested?: Within functional limits    Pertinent Vitals/Pain Pain Assessment: 0-10 Pain Score: 5  Pain Location: headache Pain Intervention(s): Limited activity within patient's tolerance;Monitored during session     Hand Dominance Right   Extremity/Trunk Assessment Upper Extremity Assessment Upper Extremity Assessment: LUE deficits/detail LUE Deficits / Details: 4/5 shoulder flexion and "breaks" with muscle test pressure LUE Sensation: decreased light touch (numbness, "pins and needles" in radial distribution from elb) LUE Coordination: decreased fine motor   Lower Extremity Assessment Lower Extremity Assessment: Defer to PT evaluation LLE Deficits / Details: LE 'breaks' with muscle test overpressure, 4-/5 LLE Sensation: decreased light touch (Numb/tingle L lateral mid-foot to mid-calf) LLE Coordination: decreased fine motor   Cervical / Trunk Assessment Cervical / Trunk Assessment: Normal   Communication Communication Communication: No difficulties   Cognition Arousal/Alertness: Awake/alert Behavior During Therapy: WFL for tasks assessed/performed Overall Cognitive Status: Within Functional Limits for tasks assessed  Exercises Exercises: Other exercises Other Exercises Other Exercises: Encouraged pt to do thumb-to-finger throughout day to increase finger dexterity as pt plays piano as a hobby. Encouraged pt to return to piano as good functional exercise for L hand. Also encouraged pt to peform strengthening exercises of LUE including shoulder flexion and abduction, elbow flexion and extension 2-3 times per day for increased  strength. Encouraged functional tasks to increase L strength and coordination.         Home Living Family/patient expects to be discharged to:: Private residence Living Arrangements: Non-relatives/Friends (Roommate) Available Help at Discharge: Friend(s);Available PRN/intermittently (roommate is out of town) Type of Home: Apartment Home Access: Stairs to enter Technical brewer of Steps: 12 Entrance Stairs-Rails: Can reach both Home Layout: One level     Bathroom Shower/Tub: Tub/shower unit Shower/tub characteristics: Architectural technologist: Standard     Home Equipment: None   Additional Comments: Roomate out of town at the moment, would be home alone      Prior Functioning/Environment Level of Independence: Independent                                       End of Session Equipment Utilized During Treatment: Gait belt  Activity Tolerance: Patient tolerated treatment well Patient left: Other (comment) (sitting EOB with PT to practice cane)   Time: 9458-5929 OT Time Calculation (min): 12 min Charges:  OT General Charges $OT Visit: 1 Procedure OT Evaluation $Initial OT Evaluation Tier I: 1 Procedure   Juluis Rainier [phone] 12/21/2013, 11:56 AM

## 2013-12-21 NOTE — Evaluation (Signed)
Speech Language Pathology Evaluation Patient Details Name: John Bullock MRN: [id number] DOB: [birth year] Today's Date: 12/21/2013 Time:  - 0950-1001    Problem List:  Patient Active Problem List   Diagnosis Date Noted  . Headache 12/20/2013  . Left-sided weakness 12/20/2013  . Stroke 12/20/2013  . Bradycardia 12/20/2013  . Sleep difficulties 04/23/2013  . Tobacco abuse 10/04/2012  . Seasonal allergies 10/04/2012  . Screening 04/05/2012  . HIV disease 12/15/2010   Past Medical History:  Past Medical History  Diagnosis Date  . HIV infection   . Migraine    Past Surgical History:  Past Surgical History  Procedure Laterality Date  . Appendectomy  1990   HPI:      Assessment / Plan / Recommendation Clinical Impression   Cognitive-linguistic skills at baseline. No f/u SLP services needed at this time.        Follow Up Recommendations    none      Pertinent Vitals/Pain Pain Assessment: 0-10 Pain Score: 5  Pain Location: headache Pain Intervention(s): Limited activity within patient's tolerance;Monitored during session   SLP Goals     SLP Evaluation Prior Functioning  Type of Home: Apartment Available Help at Discharge: Friend(s);Available PRN/intermittently (roommate is out of town)   Cognition  Overall Cognitive Status: Within Functional Limits for tasks assessed Orientation Level: Oriented X4       Expression Written Expression Dominant Hand: Right      GO   Gabriel Rainwater [place], [place] [phone]   Dylan Ruotolo Meryl 12/21/2013, 2:18 PM

## 2013-12-21 NOTE — Progress Notes (Signed)
Subjective: No significant changes overnight.   Exam: Filed Vitals:   12/21/13 1204  BP: 123/83  Pulse: 55  Temp: 98.8 F (37.1 C)  Resp: 18   Gen: In bed, NAD MS: Awake, alert, interactive and appropriate.  VE:LFYBO, EOMI Motor: 5/5 throughout with the possible exception of interossei on the left.  Sensory:decreased over area on the left hand.  DTR:2+ and symmetric   Impression: 53 yo M with numbness in the left hand and foot in the setting of headache. Migraine is a possibility, but another option would be that this is related to his cocaine use. This is currently a monophasic event.  At this time, I would favor having him follow up for an EMG as an outpatient, however if he were to have any further areas of numbness or weakness, I advised the patient to return immediately to the ED.   Recommendations: 1) EMG/NCV would need to be done as an outpatient. 2) Avoid cocaine use.  3) Could use gabapentin 300mg  TID for tingling.    Roland Rack, MD [facility] [phone]  If 7pm- 7am, please page neurology on call as listed in [place].

## 2013-12-21 NOTE — Progress Notes (Signed)
Physical Therapy Evaluation Patient Details Name: John Bullock MRN: [id number] DOB: [birth year] Today's Date: 12/21/2013   History of Present Illness  53 yo male, with 2 day numbness/tingling L side, prior to severe HA and admission to ER on 8/7.  Not all imaging completed, but imaging completed is negative for CVA, patient has history of migranes. HIV positive, on medication.  Clinical Impression  53 yo male with headache (improving), and continued numbness/tingling as well as decreased fine motor control L side.  Decreased balance and increased fall risk at this time.  Patient lives in 2nd floor apartment (stairs), and currently requires FWW for mobility.  Anticipate resolution of symptoms and graduation to cane vs walker soon.     Follow Up Recommendations Home health PT;Supervision for mobility/OOB    Equipment Recommendations  Rolling walker with 5" wheels;Cane    Recommendations for Other Services       Precautions / Restrictions Precautions Precautions: Fall Precaution Comments: L side weakness presents with impaired balance. Restrictions Weight Bearing Restrictions: No      Mobility  Bed Mobility Overal bed mobility: Modified Independent                Transfers Overall transfer level: Needs assistance Equipment used: Rolling walker (2 wheeled);Straight cane Transfers: Sit to/from Stand Sit to Stand: Min guard         General transfer comment:  (Patient feels unsteady, nervous about standing.)  Ambulation/Gait Ambulation/Gait assistance: Min guard Ambulation Distance (Feet): 150 Feet Assistive device: Rolling walker (2 wheeled);Straight cane Gait Pattern/deviations:  (Slow, leaning on walker) Gait velocity:  (Slow)   General Gait Details: Leaning on walker, short steps, standing rest breaks, unsteady with cane  Stairs Stairs: Yes Stairs assistance: Min guard Stair Management: Two rails Number of Stairs: 12 General stair comments: Step-to  pattern  Wheelchair Mobility    Modified Rankin (Stroke Patients Only)       Balance Overall balance assessment: Needs assistance   Sitting balance-Leahy Scale: Good     Standing balance support: Bilateral upper extremity supported;During functional activity Standing balance-Leahy Scale: Fair Standing balance comment: L LE unsteady, numb/tingling sensation                             Pertinent Vitals/Pain Pain Assessment: 0-10 Pain Score: 5  Pain Location: Headache Pain Intervention(s): Limited activity within patient's tolerance;Monitored during session    [place] expects to be discharged to:: Private residence Living Arrangements: Other (Comment) (Roomate) Available Help at Discharge: Other (Comment) (Roomate) Type of Home: Apartment Home Access: Stairs to enter Entrance Stairs-Rails: Can reach both Entrance Stairs-Number of Steps: 12 Home Layout: One level Home Equipment: None Additional Comments: Roomate out of town at the moment, would be home alone    Prior Function Level of Independence: Independent               Hand Dominance        Extremity/Trunk Assessment   Upper Extremity Assessment: Defer to OT evaluation (L lateral hand and forearm numb/tingle)           Lower Extremity Assessment: LLE deficits/detail   LLE Deficits / Details: LE 'breaks' with muscle test overpressure, 4-/5  Cervical / Trunk Assessment: Normal  Communication   Communication: No difficulties  Cognition Arousal/Alertness: Awake/alert Behavior During Therapy: WFL for tasks assessed/performed Overall Cognitive Status: Within Functional Limits for tasks assessed  General Comments      Exercises        Assessment/Plan    PT Assessment Patient needs continued PT services [facility] or Home health appropriate to continue care.)  PT Diagnosis Difficulty walking;Abnormality of gait;Hemiplegia  non-dominant side   PT Problem List Decreased balance;Decreased strength;Decreased mobility;Decreased knowledge of use of DME;Impaired sensation;Pain  PT Treatment Interventions DME instruction;Gait training;Stair training;Functional mobility training;Neuromuscular re-education;Balance training;Patient/family education;Therapeutic activities;Therapeutic exercise   PT Goals (Current goals can be found in the Care Plan section) Acute Rehab PT Goals Patient Stated Goal: To go home again, not fall PT Goal Formulation: With patient Time For Goal Achievement: 01/02/14 Potential to Achieve Goals: Good    Frequency Min 4X/week   Barriers to discharge Inaccessible home environment;Decreased caregiver support 2nd floor apartment (stairs), and roomate currently out of town.    Co-evaluation               End of Session Equipment Utilized During Treatment: Gait belt Activity Tolerance: Patient tolerated treatment well (standing rest breaks) Patient left: in bed;with call bell/phone within reach Nurse Communication: Mobility status         Time: 1000-1047 PT Time Calculation (min): 47 min   Charges:   PT Evaluation $Initial PT Evaluation Tier I: 1 Procedure PT Treatments $Gait Training: 8-22 mins $Therapeutic Activity: 8-22 mins   PT G Codes:          Enzo Treu L January 15, 2014, 11:22 AM

## 2013-12-21 NOTE — Progress Notes (Signed)
Patient alert and oriented, c/o of left side numbness and slight decrease sensation in upper and lower extremity, ambulated with PT, recommended walker or cane for home, decided to continue working with patient due to stairs at residence, patient received x1 order of benadryl, compazine, and ketorolac for severe migraine 10/10 with relief 0/10, continuing to monitor

## 2013-12-21 NOTE — Progress Notes (Signed)
Subjective:  Patient was seen and examined this morning. Patient is doing well, states that numbness and weakness are improving. He still has residual numbness and tingling in his left extremity, specifically along elbow down to 5th digit. Denies headache, confusion, vision changes, weakness, dizziness.  Objective: Vital signs in last 24 hours: Filed Vitals:   12/20/13 2200 12/21/13 0400 12/21/13 0803 12/21/13 1204  BP: 110/66 129/75 114/79 123/83  Pulse: 75 54 87 55  Temp: 98 F (36.7 C) 97.6 F (36.4 C) 97.7 F (36.5 C) 98.8 F (37.1 C)  TempSrc: Oral  Oral Oral  Resp: 18 18 18 18   Height:      Weight:      SpO2: 99% 100% 100% 100%   Weight change: 0.363 kg (12.8 oz)  Intake/Output Summary (Last 24 hours) at 12/21/13 1212 Last data filed at 12/21/13 0900  Gross per 24 hour  Intake    900 ml  Output      1 ml  Net    899 ml   Filed Vitals:   12/20/13 2200 12/21/13 0400 12/21/13 0803 12/21/13 1204  BP: 110/66 129/75 114/79 123/83  Pulse: 75 54 87 55  Temp: 98 F (36.7 C) 97.6 F (36.4 C) 97.7 F (36.5 C) 98.8 F (37.1 C)  TempSrc: Oral  Oral Oral  Resp: 18 18 18 18   Height:      Weight:      SpO2: 99% 100% 100% 100%   General: Vital signs reviewed.  Patient is well-developed and well-nourished, in no acute distress and cooperative with exam. Alert, somnolence improved from yesterday. Head: Normocephalic and atraumatic. Eyes: EOMI, conjunctivae normal, no scleral icterus.  Neck: Supple, trachea midline, normal ROM, no JVD, masses, thyromegaly, or carotid bruit present.  Cardiovascular: RRR, S1 normal, S2 normal, no murmurs, gallops, or rubs. Pulmonary/Chest: Clear to auscultation bilaterally, no wheezes, rales, or rhonchi. Abdominal: Soft, non-tender, non-distended, BS +, no masses, organomegaly, or guarding present.  Musculoskeletal: No joint deformities, erythema, or stiffness, ROM full and nontender. Extremities: No lower extremity edema bilaterally,  pulses  symmetric and intact bilaterally. No cyanosis or clubbing. Neurological: AA&O x3, Strength is 4/5 in upper and lower left extremity, 5/5 in right upper and lower extremity, no focal motor deficit.  Skin: Warm, dry and intact. No rashes or erythema. Psychiatric: Normal mood and affect. speech and behavior is normal. Cognition and memory are normal.   Lab Results: Basic Metabolic Panel:  Recent Labs Lab 12/20/13 0601  NA 145  K 4.1  CL 111  CO2 23  GLUCOSE 95  BUN 20  CREATININE 1.18  CALCIUM 8.6   Liver Function Tests:  Recent Labs Lab 12/20/13 0601  AST 15  ALT 6  ALKPHOS 72  BILITOT <0.2*  PROT 6.2  ALBUMIN 3.5   CBC:  Recent Labs Lab 12/20/13 0601 12/21/13 0607  WBC 4.0 4.3  NEUTROABS 1.1*  --   HGB 12.9* 12.4*  HCT 39.0 37.4*  MCV 91.3 89.7  PLT 145* 162   Cardiac Enzymes:  Recent Labs Lab 12/20/13 0950 12/20/13 1455 12/20/13 2001  TROPONINI <0.30 <0.30 <0.30   Hemoglobin A1C:  Recent Labs Lab 12/20/13 0750  HGBA1C 5.9*   Fasting Lipid Panel:  Recent Labs Lab 12/20/13 0823  CHOL 167  HDL 61  LDLCALC 88  TRIG 88  CHOLHDL 2.7   Thyroid Function Tests:  Recent Labs Lab 12/20/13 0950  TSH 2.540   Coagulation:  Recent Labs Lab 12/20/13 0601  LABPROT 12.9  INR 0.97   Urine Drug Screen: Drugs of Abuse     Component Value Date/Time   LABOPIA NONE DETECTED 12/20/2013 0826   COCAINSCRNUR POSITIVE* 12/20/2013 0826   LABBENZ NONE DETECTED 12/20/2013 0826   AMPHETMU NONE DETECTED 12/20/2013 0826   THCU NONE DETECTED 12/20/2013 0826   LABBARB NONE DETECTED 12/20/2013 0826    Alcohol Level:  Recent Labs Lab 12/20/13 0601  ETH <11   Urinalysis:  Recent Labs Lab 12/20/13 0826  COLORURINE YELLOW  LABSPEC 1.028  PHURINE 6.0  GLUCOSEU NEGATIVE  HGBUR TRACE*  BILIRUBINUR NEGATIVE  KETONESUR NEGATIVE  PROTEINUR NEGATIVE  UROBILINOGEN 0.2  NITRITE NEGATIVE  LEUKOCYTESUR NEGATIVE   Studies/Results: Dg Chest 2 View  12/20/2013    CLINICAL DATA:  Stroke.  EXAM: CHEST - 2 VIEW  COMPARISON:  01/03/2013  FINDINGS: The heart size and mediastinal contours are within normal limits. Mild bibasilar atelectasis present. There is no evidence of pulmonary edema, consolidation, pneumothorax, nodule or pleural fluid. The visualized skeletal structures are unremarkable.  IMPRESSION: No active disease.   Electronically Signed   By: Aletta Edouard M.D.   On: 12/20/2013 09:21   Ct Head Wo Contrast  12/20/2013   CLINICAL DATA:  Migraine headache.  EXAM: CT HEAD WITHOUT CONTRAST  TECHNIQUE: Contiguous axial images were obtained from the base of the skull through the vertex without intravenous contrast.  COMPARISON:  CT of the head Sep 29, 2012  FINDINGS: The ventricles and sulci are normal. No intraparenchymal hemorrhage, mass effect nor midline shift. No acute large vascular territory infarcts.  No abnormal extra-axial fluid collections. Basal cisterns are patent.  No skull fracture. The included ocular globes and orbital contents are non-suspicious. Small left maxillary and left ethmoid mucosal retention cysts without paranasal sinus air-fluid levels. The mastoid air cells are well aerated.  IMPRESSION: No acute intracranial process.  Normal noncontrast CT of the head.   Electronically Signed   By: Elon Alas   On: 12/20/2013 06:17   [name] Contrast  12/20/2013   CLINICAL DATA:  HIV. New onset headache and left-sided weakness. The weakness began 2 days ago.  The examination had to be discontinued prior to completion due to patient refusal for further imaging.  EXAM: MRI HEAD WITHOUT CONTRAST  MRA HEAD WITHOUT CONTRAST  TECHNIQUE: Multiplanar, multiecho pulse sequences of the brain and surrounding structures were obtained without intravenous contrast. Angiographic images of the head were obtained using MRA technique without contrast.  COMPARISON:  CT head from the same day.  FINDINGS: MRI HEAD FINDINGS  The diffusion-weighted images  demonstrate no evidence for acute or subacute infarction. Only knee diffusion-weighted imaging and axial T1 weighted imaging was performed before the patient discontinued the exam.  No hemorrhage or mass lesion is present. The ventricles are of normal size. No significant extra-axial fluid collection is present.  MRA HEAD FINDINGS  The time-of-flight images are moderately distorted by patient motion. This limits evaluation of media min small vessels.  Internal carotid arteries are patent bilaterally. There is no significant focal stenosis. The A1 and M1 segments could be within normal limits. The MCA bifurcations are intact. The proximal MCA branch vessels are normal.  The vertebral arteries are codominant. The PICA origins are visualized and normal. The basilar artery is intact. Both posterior cerebral arteries originate from the basilar tip.  IMPRESSION: 1. No evidence for acute infarct. 2. The study was discontinued by the patient and several sequences were not performed. The sequences which were performed are  normal. 3. No significant proximal stenosis, aneurysm, or branch vessel occlusion. 4. The MRA is severely degraded by patient motion, limiting evaluation of media min small vessels.   Electronically Signed   By: Lawrence Santiago M.D.   On: 12/20/2013 09:23   Mr Cervical Spine Wo Contrast  12/20/2013   CLINICAL DATA:  Tingling in the left arm and hand. Decreased sensation in the C8 nerve distribution. Headache.  EXAM: MRI CERVICAL SPINE WITHOUT CONTRAST  TECHNIQUE: Multiplanar, multisequence MR imaging of the cervical spine was performed. No intravenous contrast was administered.  COMPARISON:  None.  FINDINGS: The visualized intracranial contents since paraspinal soft tissues are normal. Cervical spinal cord is normal with no mass lesion or myelopathy or spinal cord compression.  C1-2 through C3-4:  Normal.  C4-5: Disc space narrowing with degenerative changes of the endplates with with small broad-based  osteophytes which slightly narrow the a lateral recesses and neural foramina bilaterally. There is edema in the endplates and in the disc asymmetric to the left. However, there is no focal neural impingement.  C5-6: Minimal disc degeneration. Minimal osteophytes to the right of midline with no impingement.  C6-7:  Tiny central disc bulge with no neural impingement.  C7-T1 and T1-2: Normal. Specifically, no evidence of impingement upon the C8 nerves.  IMPRESSION: Degenerative disc disease primarily at C4-5 with slight narrowing of both lateral recesses at that level.   Electronically Signed   By: Rozetta Nunnery M.D.   On: 12/20/2013 13:38   Mr Jodene Nam Head/brain Wo Cm  12/20/2013   CLINICAL DATA:  HIV. New onset headache and left-sided weakness. The weakness began 2 days ago.  The examination had to be discontinued prior to completion due to patient refusal for further imaging.  EXAM: MRI HEAD WITHOUT CONTRAST  MRA HEAD WITHOUT CONTRAST  TECHNIQUE: Multiplanar, multiecho pulse sequences of the brain and surrounding structures were obtained without intravenous contrast. Angiographic images of the head were obtained using MRA technique without contrast.  COMPARISON:  CT head from the same day.  FINDINGS: MRI HEAD FINDINGS  The diffusion-weighted images demonstrate no evidence for acute or subacute infarction. Only knee diffusion-weighted imaging and axial T1 weighted imaging was performed before the patient discontinued the exam.  No hemorrhage or mass lesion is present. The ventricles are of normal size. No significant extra-axial fluid collection is present.  MRA HEAD FINDINGS  The time-of-flight images are moderately distorted by patient motion. This limits evaluation of media min small vessels.  Internal carotid arteries are patent bilaterally. There is no significant focal stenosis. The A1 and M1 segments could be within normal limits. The MCA bifurcations are intact. The proximal MCA branch vessels are normal.  The  vertebral arteries are codominant. The PICA origins are visualized and normal. The basilar artery is intact. Both posterior cerebral arteries originate from the basilar tip.  IMPRESSION: 1. No evidence for acute infarct. 2. The study was discontinued by the patient and several sequences were not performed. The sequences which were performed are normal. 3. No significant proximal stenosis, aneurysm, or branch vessel occlusion. 4. The MRA is severely degraded by patient motion, limiting evaluation of media min small vessels.   Electronically Signed   By: Lawrence Santiago M.D.   On: 12/20/2013 09:23   Medications: I have reviewed the patient's current medications.  Prescriptions prior to admission  Medication Sig Dispense Refill  . elvitegravir-cobicistat-emtricitabine-tenofovir (STRIBILD) 150-150-200-300 MG TABS tablet Take 1 tablet by mouth daily. Take with food  30 tablet  5  . fluticasone (FLONASE) 50 MCG/ACT nasal spray Place 2 sprays into both nostrils daily.  16 g  0    Scheduled Meds: . aspirin  325 mg Oral Daily  . elvitegravir-cobicistat-emtricitabine-tenofovir  1 tablet Oral Q breakfast  . enoxaparin (LOVENOX) injection  40 mg Subcutaneous Q24H  . gabapentin  500 mg Oral TID  . ketorolac  30 mg Intravenous Once  . prochlorperazine  10 mg Intravenous Once   Continuous Infusions:  PRN Meds:.senna-docusate Assessment/Plan:  Mr. Tosh is a 53 year old male with HIV and current smoker admitted for left sided weakness with headache.   Left sided weakness with headache: CVA versus complicated migraines. Hemiplegic migraine and vestibular migraine are also included in the differential. CT head negative. 2D echo shows EF 35-36%, normal systolic function. MRI/MRA showed degenerative disc disease primarily at C4-5 with slight narrowing of both lateral recesses at that level. Hemoglobin A1c 5.9. Lipid panel was within normal limits. TSH normal at 2.540. Urinalysis negative for infection. UDS  positive for cocaine, although patient denies use. CXR shows no active disease. Carotid duplex shows 39% ICA stenosis bilaterally. Antegrade vertebral flow. Seen by Dr. Leonel Ramsay, Neurohospitalist. We will see how patient does on Gabapentin and then likely discharge to home. -Telemetry  -Vegetarian diet -PT/OT -ASA 325 mg daily -Gabapentin 500 mg TID -frequent neuro checks  -Patient will need outpatient follow up with [facility] for EMG  Bradycardia: HR has been in the 60s on prior hospital and office visits and today is relatively stable in 60's but does occasionally drop to 40s. Pt denies dizziness, lightheadedness or syncope. Asymptomatic. Repeat EKG showed sinus bradycardia with PAC's. Troponins x 3.  -Telemetry -Stable  HIV disease: well controlled. Follows with Dr. Linus Salmons. Last viral load undetectable and CD4 count 630 in July 2015. On Stribild at home.  -continue [place] patient on discontinuing home use of flonase due to interaction with many HIV medications   Current smoker--1/2ppd for >10 years.  -cessation strongly encouraged and counseling provided. 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 Cr appears to be at baseline ~1.1 with GFR on 80 on admission with Cr of 1.18.  -Stable  Diet: Vegetarian diet  DVT Ppx: Lovenox 40 mg SQ daily  Dispo: Disposition is deferred at this time, awaiting improvement of current medical problems.  Anticipated discharge in approximately 1-2 day(s).   The patient does not have a current PCP (No Pcp Per Patient) and does not need an [facility] hospital follow-up appointment after discharge.  The patient does not have transportation limitations that hinder transportation to clinic appointments.  .Services Needed at time of discharge: Y = Yes, Blank = No PT:   OT:   RN:   Equipment:   Other:     LOS: 1 day   Osa Craver, DO PGY-1 Internal Medicine Resident Pager # [phone] 12/21/2013 12:12 PM

## 2013-12-22 DIAGNOSIS — I6350 Cerebral infarction due to unspecified occlusion or stenosis of unspecified cerebral artery: Secondary | ICD-10-CM

## 2013-12-22 DIAGNOSIS — F1410 Cocaine abuse, uncomplicated: Secondary | ICD-10-CM | POA: Diagnosis present

## 2013-12-22 MED ORDER — GABAPENTIN 300 MG PO CAPS: 300.0000 mg | ORAL_CAPSULE | Freq: Three times a day (TID) | ORAL | Status: DC

## 2013-12-22 MED ORDER — GABAPENTIN 300 MG PO CAPS
300.0000 mg | ORAL_CAPSULE | Freq: Three times a day (TID) | ORAL | Status: DC
  Administered 2013-12-22: 300 mg via ORAL
  Filled 2013-12-22 (×3): qty 1

## 2013-12-22 MED ORDER — ASPIRIN EC 81 MG PO TBEC: 81.0000 mg | DELAYED_RELEASE_TABLET | Freq: Every day | ORAL | Status: DC

## 2013-12-22 NOTE — Progress Notes (Signed)
Pt provided with dc instructions and education. Pt verbalized understanding. All questions answered. IV removed with tip intact. Pt waiting for ride home. Dorna Bloom, RN

## 2013-12-22 NOTE — Progress Notes (Signed)
PT Cancellation Note  Patient Details Name: John Bullock MRN: [id number] DOB: [birth year]   Cancelled Treatment:    Reason Eval/Treat Not Completed: Other (comment) (pt d/c prior to PT arriving  to treat)   INGOLD,Orian Amberg 12/22/2013, 2:28 PM  [facility] [phone] (pager)

## 2013-12-22 NOTE — Progress Notes (Signed)
Subjective: No significant changes overnight.   Exam: Filed Vitals:   12/22/13 0752  BP: 123/82  Pulse: 49  Temp: 98 F (36.7 C)  Resp: 18   Gen: In bed, NAD MS: Awake, alert, interactive and appropriate.  GQ:QPYPP, EOMI Motor: 5/5 throughout with the possible exception of interossei on the left.  Sensory:decreased over area on the left hand.  DTR:2+ and symmetric   Impression: 53 yo M with numbness in the left hand and foot. 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  This is currently a monophasic event.  At this time, I would favor having him follow up for an EMG as an outpatient, however if he were to have any further areas of numbness or weakness, I advised the patient to return immediately to the ED. In that case further workup would be merited.   Recommendations: 1) EMG/NCV would need to be done as an outpatient. 2) Avoid cocaine use.  3) Could use gabapentin 300mg  TID for tingling.    Roland Rack, MD [facility] [phone]  If 7pm- 7am, please page neurology on call as listed in [place].

## 2013-12-22 NOTE — Progress Notes (Signed)
Late Entry - 0329 this am, pt heart rate decreased to 39 nonsustained.  Pt awakened and assessed.  VS as follows BP 121/65, heart rate 51, oxygen 99% on room air.  Pt denies complaints, no change in neuro exam.  Will continue to monitor.

## 2013-12-22 NOTE — Discharge Instructions (Addendum)
Thank you for allowing [place] to be involved in your healthcare while you were hospitalized at [facility].   Please note that there have been changes to your home medications.  --> PLEASE LOOK AT YOUR DISCHARGE MEDICATION LIST FOR DETAILS.  Please call clinic tomorrow to schedule an appointment.    Please call your PCP if you have any questions or concerns, or any difficulty getting any of your medications.  Please return to the ER if you have worsening of your symptoms or new severe symptoms arise.         STROKE/TIA DISCHARGE INSTRUCTIONS SMOKING Cigarette smoking nearly doubles your risk of having a stroke & is the single most alterable risk factor  If you smoke or have smoked in the last 12 months, you are advised to quit smoking for your health.  Most of the excess cardiovascular risk related to smoking disappears within a year of stopping.  Ask you doctor about anti-smoking medications  [place] Quit Line: 1-800-QUIT NOW  Free Smoking Cessation Classes [phone]  CHOLESTEROL Know your levels; limit fat & cholesterol in your diet  Lipid Panel     Component Value Date/Time   CHOL 167 12/20/2013 0823   TRIG 88 12/20/2013 0823   HDL 61 12/20/2013 0823   CHOLHDL 2.7 12/20/2013 0823   VLDL 18 12/20/2013 0823   LDLCALC 88 12/20/2013 0823      Many patients benefit from treatment even if their cholesterol is at goal.  Goal: Total Cholesterol (CHOL) less than 160  Goal:  Triglycerides (TRIG) less than 150  Goal:  HDL greater than 40  Goal:  LDL (LDLCALC) less than 100   BLOOD PRESSURE American Stroke Association blood pressure target is less that 120/80 mm/Hg  Your discharge blood pressure is:  BP: 123/82 mmHg  Monitor your blood pressure  Limit your salt and alcohol intake  Many individuals will require more than one medication for high blood pressure  DIABETES (A1c is a blood sugar average for last 3 months) Goal HGBA1c is under 7% (HBGA1c is blood sugar  average for last 3 months)  Diabetes: No known diagnosis of diabetes    Lab Results  Component Value Date   HGBA1C 5.9* 12/20/2013     Your HGBA1c can be lowered with medications, healthy diet, and exercise.  Check your blood sugar as directed by your physician  Call your physician if you experience unexplained or low blood sugars.  PHYSICAL ACTIVITY/REHABILITATION Goal is 30 minutes at least 4 days per week  Activity: Increase activity slowly, Therapies: Physical Therapy: Home Health Return to work:   Activity decreases your risk of heart attack and stroke and makes your heart stronger.  It helps control your weight and blood pressure; helps you relax and can improve your mood.  Participate in a regular exercise program.  Talk with your doctor about the best form of exercise for you (dancing, walking, swimming, cycling).  DIET/WEIGHT Goal is to maintain a healthy weight  Your discharge diet is: Vegetarian thin liquids Your height is:  Height: 6\' 1"  (185.4 cm) Your current weight is: Weight: 90.175 kg (198 lb 12.8 oz) Your Body Mass Index (BMI) is:  BMI (Calculated): 26.3  Following the type of diet specifically designed for you will help prevent another stroke.  Your goal weight range is:    Your goal Body Mass Index (BMI) is 19-24.  Healthy food habits can help reduce 3 risk factors for stroke:  High cholesterol, hypertension,  and excess weight.  RESOURCES Stroke/Support Group:  Call [phone]   STROKE EDUCATION PROVIDED/REVIEWED AND GIVEN TO PATIENT Stroke warning signs and symptoms How to activate emergency medical system (call 911). Medications prescribed at discharge. Need for follow-up after discharge. Personal risk factors for stroke. Pneumonia vaccine given: No Flu vaccine given: No My questions have been answered, the writing is legible, and I understand these instructions.  I will adhere to these goals & educational materials that have been provided to me after  my discharge from the hospital.

## 2013-12-22 NOTE — Care Management Note (Addendum)
    Page 1 of 2   12/23/2013     11:14:51 AM CARE MANAGEMENT NOTE 12/23/2013  Patient:  John Bullock, John Bullock   Account Number:  [id number]  Date Initiated:  12/22/2013  Documentation initiated by:  [facility]  Subjective/Objective Assessment:   adm: Left sided weakness/numbness, headache     Action/Plan:   discharge planning   Anticipated DC Date:  12/22/2013   Anticipated DC Plan:  [place]  CM consult      [facility] Choice  HOME HEALTH   Choice offered to / List presented to:  C-1 Patient   DME arranged  3-N-1  [place]ford Rile      DME agency  [place] arranged  [place].   Status of service:  Completed, signed off Medicare Important Message given?  NA - LOS <3 / Initial given by admissions (If response is "NO", the following Medicare IM given date fields will be blank) Date Medicare IM given:   Medicare IM given by:   Date Additional Medicare IM given:   Additional Medicare IM given by:    Discharge Disposition:  [place]  Per UR Regulation:  Reviewed for med. necessity/level of care/duration of stay  If discussed at [place] of Stay Meetings, dates discussed:    Comments:  12/22/13 14;00 CM met with pt in room to offer choice.  Pt chooses [facility] to render HHPT.  address and contact info verified with pt.  DME to be delivered to room prior to discharge.  Referral called into [place] at [facility].  No other Cm needs were communicated.  Mariane Masters, BSN, CM [phone].

## 2013-12-22 NOTE — Discharge Summary (Signed)
Name: John Bullock MRN: [id number] DOB: [birth year] 53 y.o. PCP: No Pcp Per Patient  Date of Admission: 12/20/2013  4:07 AM Date of Discharge: 12/22/2013 Attending Physician: Axel Filler, MD  Discharge Diagnosis: Principal Problem:   Left-sided weakness Active Problems:   HIV disease   Tobacco abuse   Migraines, neuralgic   Bradycardia, sinus   Cocaine abuse  Discharge Medications:   Medication List    STOP taking these medications       fluticasone 50 MCG/ACT nasal spray  Commonly known as:  FLONASE      TAKE these medications       aspirin EC 81 MG tablet  Take 1 tablet (81 mg total) by mouth daily.     elvitegravir-cobicistat-emtricitabine-tenofovir 150-150-200-300 MG Tabs tablet  Commonly known as:  STRIBILD  Take 1 tablet by mouth daily. Take with food     gabapentin 300 MG capsule  Commonly known as:  NEURONTIN  Take 1 capsule (300 mg total) by mouth 3 (three) times daily.        Disposition and follow-up:   [name] was discharged from [facility] in Good condition.  At the hospital follow up visit please address:  1. Please encourage patient to make a follow up appointment with neurology  2. Encourage patient to refrain from using Flonase nasal spray due to interaction with and retroviral therapy 3. Encourage patient to followup with infectious disease clinic 4. Follow up with home PT 5. Evaluate patient for symptoms of bradycardia. If he has symptoms, refer to cardiology as outpatient 6. Pending labs/ test needing follow-up: none  Follow-up Appointments: Follow-up Information   Follow up with [facility] In 1 day. (Please call clinic tomorrow Monday for an appointment.)    Contact information:   [address] [address] [phone]      Follow up with [facility] In 2 days. (Please call for an appointment. )    Contact information:   [address] [place] [address] [phone]      Discharge Instructions: Discharge Instructions   Call MD for:  extreme fatigue    Complete by:  As directed      Call MD for:  persistant dizziness or light-headedness    Complete by:  As directed      Call MD for:  redness, tenderness, or signs of infection (pain, swelling, redness, odor or green/yellow discharge around incision site)    Complete by:  As directed      Diet - low sodium heart healthy    Complete by:  As directed      Face-to-face encounter (required for Medicare/Medicaid patients)    Complete by:  As directed   I Jessee Avers certify that this patient is under my care and that I, or a nurse practitioner or physician's assistant working with me, had a face-to-face encounter that meets the physician face-to-face encounter requirements with this patient on 12/22/2013. The encounter with the patient was in whole, or in part for the following medical condition(s) which is the primary reason for home health care (List medical condition): cocaine induced neurolopathy, TIA, and complex migraine.  The encounter with the patient was in whole, or in part, for the following medical condition, which is the primary reason for home health care:  left sided weakness  I certify that, based on my findings, the following services are medically necessary home health services:  Physical therapy  My clinical findings support the need for the above services:  Unable to leave home safely without assistance and/or assistive device  Further, I certify that my clinical findings support that this patient is homebound due to:  Unable to leave home safely without assistance  Reason for Medically Necessary Home Health Services:  Skilled Nursing- Change/Decline in Patient Status     For home use only DME Cane    Complete by:  As directed      For home use only DME Walker rolling    Complete by:  As directed      Home Health    Complete by:  As directed   To provide the  following care/treatments:  PT     Increase activity slowly    Complete by:  As directed            Consultations: Treatment Team:  Catarina Hartshorn, MD  Procedures Performed:  Dg Chest 2 View  12/20/2013   CLINICAL DATA:  Stroke.  EXAM: CHEST - 2 VIEW  COMPARISON:  01/03/2013  FINDINGS: The heart size and mediastinal contours are within normal limits. Mild bibasilar atelectasis present. There is no evidence of pulmonary edema, consolidation, pneumothorax, nodule or pleural fluid. The visualized skeletal structures are unremarkable.  IMPRESSION: No active disease.   Electronically Signed   By: Aletta Edouard M.D.   On: 12/20/2013 09:21   Ct Head Wo Contrast  12/20/2013   CLINICAL DATA:  Migraine headache.  EXAM: CT HEAD WITHOUT CONTRAST  TECHNIQUE: Contiguous axial images were obtained from the base of the skull through the vertex without intravenous contrast.  COMPARISON:  CT of the head Sep 29, 2012  FINDINGS: The ventricles and sulci are normal. No intraparenchymal hemorrhage, mass effect nor midline shift. No acute large vascular territory infarcts.  No abnormal extra-axial fluid collections. Basal cisterns are patent.  No skull fracture. The included ocular globes and orbital contents are non-suspicious. Small left maxillary and left ethmoid mucosal retention cysts without paranasal sinus air-fluid levels. The mastoid air cells are well aerated.  IMPRESSION: No acute intracranial process.  Normal noncontrast CT of the head.   Electronically Signed   By: Elon Alas   On: 12/20/2013 06:17   [name] Contrast  12/20/2013   CLINICAL DATA:  HIV. New onset headache and left-sided weakness. The weakness began 2 days ago.  The examination had to be discontinued prior to completion due to patient refusal for further imaging.  EXAM: MRI HEAD WITHOUT CONTRAST  MRA HEAD WITHOUT CONTRAST  TECHNIQUE: Multiplanar, multiecho pulse sequences of the brain and surrounding structures were obtained  without intravenous contrast. Angiographic images of the head were obtained using MRA technique without contrast.  COMPARISON:  CT head from the same day.  FINDINGS: MRI HEAD FINDINGS  The diffusion-weighted images demonstrate no evidence for acute or subacute infarction. Only knee diffusion-weighted imaging and axial T1 weighted imaging was performed before the patient discontinued the exam.  No hemorrhage or mass lesion is present. The ventricles are of normal size. No significant extra-axial fluid collection is present.  MRA HEAD FINDINGS  The time-of-flight images are moderately distorted by patient motion. This limits evaluation of media min small vessels.  Internal carotid arteries are patent bilaterally. There is no significant focal stenosis. The A1 and M1 segments could be within normal limits. The MCA bifurcations are intact. The proximal MCA branch vessels are normal.  The vertebral arteries are codominant. The PICA origins are visualized and normal.  The basilar artery is intact. Both posterior cerebral arteries originate from the basilar tip.  IMPRESSION: 1. No evidence for acute infarct. 2. The study was discontinued by the patient and several sequences were not performed. The sequences which were performed are normal. 3. No significant proximal stenosis, aneurysm, or branch vessel occlusion. 4. The MRA is severely degraded by patient motion, limiting evaluation of media min small vessels.   Electronically Signed   By: Lawrence Santiago M.D.   On: 12/20/2013 09:23   Mr Cervical Spine Wo Contrast  12/20/2013   CLINICAL DATA:  Tingling in the left arm and hand. Decreased sensation in the C8 nerve distribution. Headache.  EXAM: MRI CERVICAL SPINE WITHOUT CONTRAST  TECHNIQUE: Multiplanar, multisequence MR imaging of the cervical spine was performed. No intravenous contrast was administered.  COMPARISON:  None.  FINDINGS: The visualized intracranial contents since paraspinal soft tissues are normal. Cervical  spinal cord is normal with no mass lesion or myelopathy or spinal cord compression.  C1-2 through C3-4:  Normal.  C4-5: Disc space narrowing with degenerative changes of the endplates with with small broad-based osteophytes which slightly narrow the a lateral recesses and neural foramina bilaterally. There is edema in the endplates and in the disc asymmetric to the left. However, there is no focal neural impingement.  C5-6: Minimal disc degeneration. Minimal osteophytes to the right of midline with no impingement.  C6-7:  Tiny central disc bulge with no neural impingement.  C7-T1 and T1-2: Normal. Specifically, no evidence of impingement upon the C8 nerves.  IMPRESSION: Degenerative disc disease primarily at C4-5 with slight narrowing of both lateral recesses at that level.   Electronically Signed   By: Rozetta Nunnery M.D.   On: 12/20/2013 13:38   Mr Jodene Nam Head/brain Wo Cm  12/20/2013   CLINICAL DATA:  HIV. New onset headache and left-sided weakness. The weakness began 2 days ago.  The examination had to be discontinued prior to completion due to patient refusal for further imaging.  EXAM: MRI HEAD WITHOUT CONTRAST  MRA HEAD WITHOUT CONTRAST  TECHNIQUE: Multiplanar, multiecho pulse sequences of the brain and surrounding structures were obtained without intravenous contrast. Angiographic images of the head were obtained using MRA technique without contrast.  COMPARISON:  CT head from the same day.  FINDINGS: MRI HEAD FINDINGS  The diffusion-weighted images demonstrate no evidence for acute or subacute infarction. Only knee diffusion-weighted imaging and axial T1 weighted imaging was performed before the patient discontinued the exam.  No hemorrhage or mass lesion is present. The ventricles are of normal size. No significant extra-axial fluid collection is present.  MRA HEAD FINDINGS  The time-of-flight images are moderately distorted by patient motion. This limits evaluation of media min small vessels.  Internal  carotid arteries are patent bilaterally. There is no significant focal stenosis. The A1 and M1 segments could be within normal limits. The MCA bifurcations are intact. The proximal MCA branch vessels are normal.  The vertebral arteries are codominant. The PICA origins are visualized and normal. The basilar artery is intact. Both posterior cerebral arteries originate from the basilar tip.  IMPRESSION: 1. No evidence for acute infarct. 2. The study was discontinued by the patient and several sequences were not performed. The sequences which were performed are normal. 3. No significant proximal stenosis, aneurysm, or branch vessel occlusion. 4. The MRA is severely degraded by patient motion, limiting evaluation of media min small vessels.   Electronically Signed   By: Lawrence Santiago M.D.   On:  12/20/2013 09:23    2D Echo: Study Conclusions  - Left ventricle: The cavity size was normal. There was mild concentric hypertrophy. Systolic function was normal. The estimated ejection fraction was in the range of 50% to 55%. Wall motion was normal; there were no regional wall motion abnormalities. - Pulmonary arteries: PA peak pressure: 32 mm Hg (S).  Carotid Dopplers bilateral >>Preliminary report: Bilateral: 1-39% ICA stenosis. Vertebral artery flow is antegrade.   Admission HPI: Mr. John Bullock is a 53 year old male with well controlled HIV and current smoker presenting to the ED today with complaints of headache and left sided weakness. He reports 2 days ago (Wed 8/5) developing L sided weakness and numbness around afternoon time that has persisted up to today. He reports feeling needling tingling sensation in both upper and lower extremities on the left. Additionally, he developed a severe throbbing headache also on the left side above the ear and blurry vision yesterday around 3-4pm. He reports hx of migraines and has similar types of headaches in the past but does feel like this one is different given the  numbness in his arm and leg as well. He then called the ambulance around 11pm because things were not improving. He did not take anything at home for the headache and can usually sleep it off in the past. In regards to blurry vision, he says it is in both eyes, slightly improved today but still present. He also feels very dizzy and light-headed and is scared to stand. He also is sensitive to light at this time that appears to be a new symptom but is able to move his neck and no tenderness to palpation.  He denies chest pain, sob, N/V/D, abdominal pain, recent trauma, taking any other medications other than his HIV medication, and no sick contacts. He is unaware of having a slow heart rate in the past. He follows with Dr. Linus Salmons for his HIV.  He is a current smoker, up to 1/2ppd, occasionally drinks alcohol, and reports family history of strokes in both his mother and father.   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 CVA was been excluded by had a CT scan, and a brain MRI and MR MRA. Of note, however, MRI was limited due to patient's motion during the study. He also received an MRI of the cervical spine which revealed degenerative disc disease primarily of C4-5 with slight narrowing of both lateral recesses at the level. It was unclear whether this finding was accounting for his described weakness in both lower and upper extremities. Hemiplegic migraine and vestibular migraine were also included in the differential. UDS positive for cocaine, although patient denies use. CXR shows no active disease. Carotid duplex - normal. After this extensive evaluation involving input from her neurology. It was felt that his symptoms were more likely due to cocaine induced  neuropathy or a complex migraine. During his stay, he complained of headache, which responded to a headache "cocktail" (Benadryl, Toradol, and Compazine ). Dr. Leonel Ramsay of neurology, recommended trial of gabapentin 300 mg 3 times a day and followup with the neurology as outpatient for consideration of EMG/NCV. He was also sent out on aspirin 81 mg daily. He was evaluated by physical therapy, who recommended a rolling  walker and walking cane in addition to home health PT. These were ordered after discharge. We provided patient with information to contact the [facility] neurology for an appointment. During his outpatient followup in internal medicine clinic, patient will be assisted with this appointment as well. Since he was discharged over the weekend, a message was sent to the front desk of the internal medicine clinic to contact him to schedule an appointment in a few days.   Cocaine Abuse: Patient denies active use but UDS was positive. Counseled to avoid cocaine.   Asymptomatic Bradycardia: HR has been in the 60s on prior hospital and office visits. In-house HR 39-60 without symptoms. Repeat EKG showed sinus bradycardia with PAC's. Troponins x 3. Since he remained asymptomatic, cardiology consult was not obtained, but if he developed symptoms, a referral to cardiology as outpatient can be considered.   HIV disease: well controlled. Follows with Dr. Linus Salmons. Last viral load undetectable and CD4 count 630 in July 2015. On Stribild at home. He was counseled on discontinuing home use of flonase due to interaction with many HIV medications.    Current smoker--1/2ppd for >10 years. Counseled about smoking cessation.   Discharge Vitals:   BP 123/82  Pulse 49  Temp(Src) 98 F (36.7 C) (Oral)  Resp 18  Ht 6\' 1"  (1.854 m)  Wt 198 lb 12.8 oz (90.175 kg)  BMI 26.23 kg/m2  SpO2 100%  Discharge Labs:  No results found for this or any previous visit (from the past 24 hour(s)).  Signed: Jessee Avers,  MD 12/22/2013, 9:45 AM    Services Ordered on Discharge: Home PT Equipment Ordered on Discharge: Rolling walker and cane

## 2013-12-22 NOTE — Progress Notes (Signed)
Subjective: Feeling better today. Still reports a slight tingling in the left 4th and 5th fingers.  Had a migraine headache yesterday which resolved with headache cocktail.   Objective: Vital signs in last 24 hours: Filed Vitals:   12/21/13 2000 12/22/13 0000 12/22/13 0332 12/22/13 0752  BP: 112/68 125/73 121/65 123/82  Pulse: 53 58 51 49  Temp: 98.4 F (36.9 C) 97.9 F (36.6 C) 97.8 F (36.6 C) 98 F (36.7 C)  TempSrc:   Oral   Resp: 15 16 16 18   Height:      Weight:      SpO2: 100% 100% 99% 100%   Weight change:   Intake/Output Summary (Last 24 hours) at 12/22/13 0908 Last data filed at 12/22/13 5176  Gross per 24 hour  Intake    520 ml  Output      0 ml  Net    520 ml   Filed Vitals:   12/21/13 2000 12/22/13 0000 12/22/13 0332 12/22/13 0752  BP: 112/68 125/73 121/65 123/82  Pulse: 53 58 51 49  Temp: 98.4 F (36.9 C) 97.9 F (36.6 C) 97.8 F (36.6 C) 98 F (36.7 C)  TempSrc:   Oral   Resp: 15 16 16 18   Height:      Weight:      SpO2: 100% 100% 99% 100%   General: Vital signs reviewed.  Alert and orientated X3 Head: Normocephalic and atraumatic. Eyes: EOMI, conjunctivae normal, no scleral icterus.  Cardiovascular: bradycardia but RRR, no murmurs  Pulmonary/Chest: Clear to auscultation bilaterally. Abdominal: Soft, non-tender, non-distended, BS +, no masses, organomegaly, or guarding present.  Extremities: No edema.  Neurological: Strength is 4/5 in upper and lower left extremity, 5/5 in right upper and lower extremity, no focal motor deficit.   Lab Results: Basic Metabolic Panel:  Recent Labs Lab 12/20/13 0601  NA 145  K 4.1  CL 111  CO2 23  GLUCOSE 95  BUN 20  CREATININE 1.18  CALCIUM 8.6   Liver Function Tests:  Recent Labs Lab 12/20/13 0601  AST 15  ALT 6  ALKPHOS 72  BILITOT <0.2*  PROT 6.2  ALBUMIN 3.5   CBC:  Recent Labs Lab 12/20/13 0601 12/21/13 0607  WBC 4.0 4.3  NEUTROABS 1.1*  --   HGB 12.9* 12.4*  HCT 39.0  37.4*  MCV 91.3 89.7  PLT 145* 162   Cardiac Enzymes:  Recent Labs Lab 12/20/13 0950 12/20/13 1455 12/20/13 2001  TROPONINI <0.30 <0.30 <0.30   Hemoglobin A1C:  Recent Labs Lab 12/20/13 0750  HGBA1C 5.9*   Fasting Lipid Panel:  Recent Labs Lab 12/20/13 0823  CHOL 167  HDL 61  LDLCALC 88  TRIG 88  CHOLHDL 2.7   Thyroid Function Tests:  Recent Labs Lab 12/20/13 0950  TSH 2.540   Coagulation:  Recent Labs Lab 12/20/13 0601  LABPROT 12.9  INR 0.97   Urine Drug Screen: Drugs of Abuse     Component Value Date/Time   LABOPIA NONE DETECTED 12/20/2013 0826   COCAINSCRNUR POSITIVE* 12/20/2013 0826   LABBENZ NONE DETECTED 12/20/2013 0826   AMPHETMU NONE DETECTED 12/20/2013 0826   THCU NONE DETECTED 12/20/2013 0826   LABBARB NONE DETECTED 12/20/2013 0826    Alcohol Level:  Recent Labs Lab 12/20/13 0601  ETH <11   Urinalysis:  Recent Labs Lab 12/20/13 0826  COLORURINE YELLOW  LABSPEC 1.028  PHURINE 6.0  GLUCOSEU NEGATIVE  HGBUR TRACE*  BILIRUBINUR NEGATIVE  KETONESUR NEGATIVE  PROTEINUR NEGATIVE  UROBILINOGEN  0.2  NITRITE NEGATIVE  LEUKOCYTESUR NEGATIVE   Studies/Results: Dg Chest 2 View  12/20/2013   CLINICAL DATA:  Stroke.  EXAM: CHEST - 2 VIEW  COMPARISON:  01/03/2013  FINDINGS: The heart size and mediastinal contours are within normal limits. Mild bibasilar atelectasis present. There is no evidence of pulmonary edema, consolidation, pneumothorax, nodule or pleural fluid. The visualized skeletal structures are unremarkable.  IMPRESSION: No active disease.   Electronically Signed   By: Aletta Edouard M.D.   On: 12/20/2013 09:21   [name] Contrast  12/20/2013   CLINICAL DATA:  HIV. New onset headache and left-sided weakness. The weakness began 2 days ago.  The examination had to be discontinued prior to completion due to patient refusal for further imaging.  EXAM: MRI HEAD WITHOUT CONTRAST  MRA HEAD WITHOUT CONTRAST  TECHNIQUE: Multiplanar, multiecho  pulse sequences of the brain and surrounding structures were obtained without intravenous contrast. Angiographic images of the head were obtained using MRA technique without contrast.  COMPARISON:  CT head from the same day.  FINDINGS: MRI HEAD FINDINGS  The diffusion-weighted images demonstrate no evidence for acute or subacute infarction. Only knee diffusion-weighted imaging and axial T1 weighted imaging was performed before the patient discontinued the exam.  No hemorrhage or mass lesion is present. The ventricles are of normal size. No significant extra-axial fluid collection is present.  MRA HEAD FINDINGS  The time-of-flight images are moderately distorted by patient motion. This limits evaluation of media min small vessels.  Internal carotid arteries are patent bilaterally. There is no significant focal stenosis. The A1 and M1 segments could be within normal limits. The MCA bifurcations are intact. The proximal MCA branch vessels are normal.  The vertebral arteries are codominant. The PICA origins are visualized and normal. The basilar artery is intact. Both posterior cerebral arteries originate from the basilar tip.  IMPRESSION: 1. No evidence for acute infarct. 2. The study was discontinued by the patient and several sequences were not performed. The sequences which were performed are normal. 3. No significant proximal stenosis, aneurysm, or branch vessel occlusion. 4. The MRA is severely degraded by patient motion, limiting evaluation of media min small vessels.   Electronically Signed   By: Lawrence Santiago M.D.   On: 12/20/2013 09:23   Mr Cervical Spine Wo Contrast  12/20/2013   CLINICAL DATA:  Tingling in the left arm and hand. Decreased sensation in the C8 nerve distribution. Headache.  EXAM: MRI CERVICAL SPINE WITHOUT CONTRAST  TECHNIQUE: Multiplanar, multisequence MR imaging of the cervical spine was performed. No intravenous contrast was administered.  COMPARISON:  None.  FINDINGS: The visualized  intracranial contents since paraspinal soft tissues are normal. Cervical spinal cord is normal with no mass lesion or myelopathy or spinal cord compression.  C1-2 through C3-4:  Normal.  C4-5: Disc space narrowing with degenerative changes of the endplates with with small broad-based osteophytes which slightly narrow the a lateral recesses and neural foramina bilaterally. There is edema in the endplates and in the disc asymmetric to the left. However, there is no focal neural impingement.  C5-6: Minimal disc degeneration. Minimal osteophytes to the right of midline with no impingement.  C6-7:  Tiny central disc bulge with no neural impingement.  C7-T1 and T1-2: Normal. Specifically, no evidence of impingement upon the C8 nerves.  IMPRESSION: Degenerative disc disease primarily at C4-5 with slight narrowing of both lateral recesses at that level.   Electronically Signed   By: Vanita Ingles.D.  On: 12/20/2013 13:38   Mr Jodene Nam Head/brain Wo Cm  12/20/2013   CLINICAL DATA:  HIV. New onset headache and left-sided weakness. The weakness began 2 days ago.  The examination had to be discontinued prior to completion due to patient refusal for further imaging.  EXAM: MRI HEAD WITHOUT CONTRAST  MRA HEAD WITHOUT CONTRAST  TECHNIQUE: Multiplanar, multiecho pulse sequences of the brain and surrounding structures were obtained without intravenous contrast. Angiographic images of the head were obtained using MRA technique without contrast.  COMPARISON:  CT head from the same day.  FINDINGS: MRI HEAD FINDINGS  The diffusion-weighted images demonstrate no evidence for acute or subacute infarction. Only knee diffusion-weighted imaging and axial T1 weighted imaging was performed before the patient discontinued the exam.  No hemorrhage or mass lesion is present. The ventricles are of normal size. No significant extra-axial fluid collection is present.  MRA HEAD FINDINGS  The time-of-flight images are moderately distorted by patient  motion. This limits evaluation of media min small vessels.  Internal carotid arteries are patent bilaterally. There is no significant focal stenosis. The A1 and M1 segments could be within normal limits. The MCA bifurcations are intact. The proximal MCA branch vessels are normal.  The vertebral arteries are codominant. The PICA origins are visualized and normal. The basilar artery is intact. Both posterior cerebral arteries originate from the basilar tip.  IMPRESSION: 1. No evidence for acute infarct. 2. The study was discontinued by the patient and several sequences were not performed. The sequences which were performed are normal. 3. No significant proximal stenosis, aneurysm, or branch vessel occlusion. 4. The MRA is severely degraded by patient motion, limiting evaluation of media min small vessels.   Electronically Signed   By: Lawrence Santiago M.D.   On: 12/20/2013 09:23   Medications: I have reviewed the patient's current medications.  Prescriptions prior to admission  Medication Sig Dispense Refill  . elvitegravir-cobicistat-emtricitabine-tenofovir (STRIBILD) 150-150-200-300 MG TABS tablet Take 1 tablet by mouth daily. Take with food  30 tablet  5  . fluticasone (FLONASE) 50 MCG/ACT nasal spray Place 2 sprays into both nostrils daily.  16 g  0    Scheduled Meds: . aspirin  325 mg Oral Daily  . elvitegravir-cobicistat-emtricitabine-tenofovir  1 tablet Oral Q breakfast  . enoxaparin (LOVENOX) injection  40 mg Subcutaneous Q2200  . gabapentin  500 mg Oral TID   Continuous Infusions:  PRN Meds:.senna-docusate Assessment/Plan:  Mr. Vencill is a 53 year old male with HIV and current smoker admitted for left sided weakness with headache.   Migraine versus Cocaine induced Neuropathy: Improved. CVA has been excluded. Hemiplegic migraine and vestibular migraine are also included in the differential. CT head negative. 2D echo shows EF 86-38%, normal systolic function. MRI/MRA showed degenerative disc  disease primarily at C4-5 with slight narrowing of both lateral recesses at that level. UDS positive for cocaine, although patient denies use. CXR shows no active disease. Carotid duplex - normal. Plan  -discharge on Gabapentin 300 mg tid for tingling -EMG/NCV would need to be done as an outpatient per neurology.  - Avoid cocaine use.  - to follow up with neurology  - discharged on ASA 81 mg daily  Cocaine Abuse: Patient denies active use but UDS was positive. Counseled to avoid cocaine.   Asymptomatic Bradycardia: HR has been in the 60s on prior hospital and office visits. In-house HR 39-60 without symptoms. Repeat EKG showed sinus bradycardia with PAC's. Troponins x 3.  -Telemetry -Stable - will monitor  as outpatient   HIV disease: well controlled. Follows with Dr. Linus Salmons. Last viral load undetectable and CD4 count 630 in July 2015. On Stribild at home.  -continue Pharr patient on discontinuing home use of flonase due to interaction with many HIV medications   Current smoker--1/2ppd for >10 years. Counseled about smoking cessation.    Diet: Vegetarian diet  DVT Ppx: Lovenox 40 mg SQ daily  Dispo: Disposition is deferred at this time, awaiting improvement of current medical problems.  Discharge home today.   The patient does not have a current PCP (No Pcp Per Patient) and does not need an [facility] hospital follow-up appointment after discharge.  The patient does not have transportation limitations that hinder transportation to clinic appointments.  .Services Needed at time of discharge: Y = Yes, Blank = No PT:   OT:   RN:   Equipment:   Other:     LOS: 2 days  Signed:  Jessee Avers, MD PGY-3 Internal Medicine Teaching Service Pager: [phone] 12/22/2013, 9:45 AM

## 2013-12-24 ENCOUNTER — Telehealth: Payer: Self-pay | Admitting: *Deleted

## 2013-12-24 ENCOUNTER — Encounter: Payer: Self-pay | Admitting: Internal Medicine

## 2013-12-24 ENCOUNTER — Ambulatory Visit (INDEPENDENT_AMBULATORY_CARE_PROVIDER_SITE_OTHER): Payer: Medicare Other | Admitting: Internal Medicine

## 2013-12-24 VITALS — BP 105/66 | HR 63 | Temp 97.7°F | Wt 201.0 lb

## 2013-12-24 DIAGNOSIS — Z113 Encounter for screening for infections with a predominantly sexual mode of transmission: Secondary | ICD-10-CM

## 2013-12-24 DIAGNOSIS — F1410 Cocaine abuse, uncomplicated: Secondary | ICD-10-CM

## 2013-12-24 DIAGNOSIS — G44019 Episodic cluster headache, not intractable: Secondary | ICD-10-CM

## 2013-12-24 DIAGNOSIS — R531 Weakness: Secondary | ICD-10-CM

## 2013-12-24 DIAGNOSIS — M6281 Muscle weakness (generalized): Secondary | ICD-10-CM

## 2013-12-24 DIAGNOSIS — I6350 Cerebral infarction due to unspecified occlusion or stenosis of unspecified cerebral artery: Secondary | ICD-10-CM

## 2013-12-24 DIAGNOSIS — B20 Human immunodeficiency virus [HIV] disease: Secondary | ICD-10-CM

## 2013-12-24 NOTE — Assessment & Plan Note (Signed)
Feels this was an anomaly and not abuse.

## 2013-12-24 NOTE — Telephone Encounter (Signed)
Called patient and left voice mail to return my call. His appointment for [facility] is scheduled for 02/03/14 at 10:30 AM, office note, demographic, and insurance information faxed. [facility] received referral and will call patient when scheduled. Message sent to [facility] and I will call patient when that appointment has been scheduled. Myrtis Hopping

## 2013-12-24 NOTE — Progress Notes (Signed)
   Subjective:    Patient ID: John Bullock, male    DOB: 1960-06-02, 53 y.o.   MRN: [id number]  HPI Here for hospital and 042 follow up.  For his HIV, his last labs are good with CD4 of 630 and continued undetectable vl.     On 8/7 he went to the ED via EMS due to stroke-like symptoms.  He has had issues with migraines and the headache felt like a migraine on the left side though this time developed numbness and tingling left arm. He was noted to be cocaine positive and after not thinking that was real, he realized he smoke laced marijuana from a friend recently. He is not interested in doing cocaine at all and no longer associates with "friends".  He continues to have periodic issues with migraines.  He had some tingling though seems to be better with gabapentin.  MRI was unrevealing.        Review of Systems  Constitutional: Negative for fever and fatigue.  HENT: Negative for trouble swallowing.   Respiratory: Negative for cough.   Cardiovascular: Negative for chest pain.  Gastrointestinal: Negative for nausea and diarrhea.  Skin: Negative for rash.  Neurological: Negative for dizziness.       Objective:   Physical Exam  Constitutional: He appears well-developed and well-nourished. No distress.  HENT:  Mouth/Throat: No oropharyngeal exudate.  Eyes: No scleral icterus.  Cardiovascular: Normal rate, regular rhythm and normal heart sounds.   No murmur heard. Pulmonary/Chest: Effort normal and breath sounds normal. No respiratory distress.  Lymphadenopathy:    He has no cervical adenopathy.  Skin: No rash noted.          Assessment & Plan:

## 2013-12-24 NOTE — Assessment & Plan Note (Signed)
Doing well from this standpoint, rtc 4 months.

## 2013-12-24 NOTE — Assessment & Plan Note (Signed)
Etiology seems to be unclear still.  It has resolved though not sure if neurontin or resolved.  Some weakness and getting PT today.   It does not seem that he had an actual CVA.  I am going to have him see neurology to determine if CVA or migraine related, does he need daily aspirin, does he need EMG as suggested by Dr. Leonel Ramsay.   I also will send to PCP to monitor blood pressure, cholesterol and other screening issues.  Smoking cessation, etc..

## 2013-12-27 ENCOUNTER — Telehealth: Payer: Self-pay | Admitting: *Deleted

## 2013-12-27 NOTE — Telephone Encounter (Signed)
Called patient and notified him of appointment with [facility] for 01/06/14 at 10:15 AM. John Bullock

## 2014-01-06 ENCOUNTER — Ambulatory Visit (INDEPENDENT_AMBULATORY_CARE_PROVIDER_SITE_OTHER): Payer: Medicare Other | Admitting: Internal Medicine

## 2014-01-06 ENCOUNTER — Encounter: Payer: Self-pay | Admitting: Internal Medicine

## 2014-01-06 VITALS — BP 120/87 | HR 59 | Temp 97.9°F | Ht 72.0 in | Wt 204.9 lb

## 2014-01-06 DIAGNOSIS — G44019 Episodic cluster headache, not intractable: Secondary | ICD-10-CM

## 2014-01-06 DIAGNOSIS — I6350 Cerebral infarction due to unspecified occlusion or stenosis of unspecified cerebral artery: Secondary | ICD-10-CM

## 2014-01-06 NOTE — Progress Notes (Signed)
   Subjective:    Patient ID: John Bullock, male    DOB: 1961-04-08, 53 y.o.   MRN: [id number]  HPI John Bullock is a 53yo man w/ PMHx of migraines, HIV, and tobacco abuse who presents today for a hospital follow-up visit. Pt was hospitalized from 12/20/13-12/22/13 after presenting with a migraine associated with left-sided weakness concerning for stroke/TIA.  CT and MRI of the head were negative. Carotid duplex was normal. Pt was found to have UDS positive for cocaine. 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 He was discharged on aspirin 81 mg daily and gabapentin 300 mg TID.  Today, patient reports he is doing well. He had another migraine 2 days ago where he had light and sound sensitivity. He described his headache as 5-6/10 in severity, left-sided, and throbbing. He states the gabapentin alleviates his migraines. Pt notes his left-sided weakness is much improved. He states the function of his left arm, left hand, and left leg is almost back to normal. He still notes some difficulty with walking with his left leg. Pt states physical therapy is helping. Pt denies any changes in vision, any new weakness, and numbness/tingling. Pt reports he has an appointment with Neurology on Sept 4th.   Review of Systems General: Denies fever, chills, night sweats, changes in weight, changes in appetite HEENT: Denies ear pain, rhinorrhea, sore throat CV: Denies CP, palpitations, SOB, orthopnea Pulm: Denies SOB, cough, wheezing GI: Denies abdominal pain, nausea, vomiting, diarrhea, constipation, melena, hematochezia GU: Denies dysuria, hematuria, frequency Msk: Denies muscle cramps, joint pains Neuro: See HPI Skin: Denies rashes, bruising    Objective:   Physical Exam General: appears stated age, sitting up in chair, NAD HEENT: [place]/AT, EOMI, PERRL, sclera anicteric, pharynx non-erythematous, mucus membranes moist Neck: supple, no JVD, no lymphadenopathy CV: RRR, normal S1/S2, no  m/g/r Pulm: CTA bilaterally, breaths non-labored, no wheezing Abd: BS+, soft, non-distended, non-tender Ext: warm, no edema, moves all Neuro: alert and oriented x 3, CNs II-XII intact, strength 5/5 in upper and right lower extremity, strength 4+/5 in left lower extremity      Assessment & Plan:

## 2014-01-06 NOTE — Patient Instructions (Signed)
It was a pleasure taking care of you today, John Bullock.  Here is a summary of what we discussed today:  1. Migraines - Continue Gabapentin 300 mg three times a day - Please go to your neurology appointment on Sept 4th at [facility] Instructions:   Thank you for bringing your medicines today. This helps [place] keep you safe from mistakes.   Progress Toward Treatment Goals:  No flowsheet data found.  Self Care Goals & Plans:  No flowsheet data found.  No flowsheet data found.   Care Management & Community Referrals:  No flowsheet data found.

## 2014-01-06 NOTE — Assessment & Plan Note (Signed)
Pt notes migraines are controlled with gabapentin 300 mg TID. He has an appointment with neurology on Sept 4th. His left-sided weakness is improving with physical therapy.  - Continue gabapentin 300 mg TID - Continue ASA - Follow up any recommendations/changes with neurology

## 2014-01-07 NOTE — Progress Notes (Signed)
Internal Medicine Clinic Attending Date of visit: 01/06/2014  I saw and evaluated the patient.  I personally confirmed the key portions of the history and exam documented by Dr. Arcelia Jew and I reviewed pertinent patient test results.  The assessment, diagnosis, and plan were formulated together and I agree with the documentation in the resident's note.

## 2014-01-17 ENCOUNTER — Ambulatory Visit (INDEPENDENT_AMBULATORY_CARE_PROVIDER_SITE_OTHER): Payer: Medicare Other | Admitting: Neurology

## 2014-01-17 ENCOUNTER — Encounter: Payer: Self-pay | Admitting: Neurology

## 2014-01-17 VITALS — BP 112/70 | HR 63 | Temp 98.2°F | Ht 72.0 in | Wt 205.0 lb

## 2014-01-17 DIAGNOSIS — R209 Unspecified disturbances of skin sensation: Secondary | ICD-10-CM | POA: Diagnosis not present

## 2014-01-17 DIAGNOSIS — I6350 Cerebral infarction due to unspecified occlusion or stenosis of unspecified cerebral artery: Secondary | ICD-10-CM | POA: Diagnosis not present

## 2014-01-17 DIAGNOSIS — B20 Human immunodeficiency virus [HIV] disease: Secondary | ICD-10-CM

## 2014-01-17 DIAGNOSIS — R51 Headache: Secondary | ICD-10-CM | POA: Diagnosis not present

## 2014-01-17 DIAGNOSIS — R200 Anesthesia of skin: Secondary | ICD-10-CM

## 2014-01-17 MED ORDER — DIAZEPAM 5 MG PO TABS: ORAL_TABLET | ORAL | Status: DC

## 2014-01-17 MED ORDER — GABAPENTIN 300 MG PO CAPS: ORAL_CAPSULE | ORAL | Status: DC

## 2014-01-17 NOTE — Patient Instructions (Signed)
1. Increase Gabapentin 300mg : 1 cap in AM, 1 cap at noon, 2 caps at bedtime 2. Schedule MRI brain with and without contrast 3. EMG/NCV left upper and lower extremity 4. Follow-up in 3 months

## 2014-01-17 NOTE — Progress Notes (Signed)
NEUROLOGY CONSULTATION NOTE  Kimari Coudriet MRN: [id number] DOB: 07-04-1960  Referring provider: Dr. Scharlene Gloss Primary care provider: Dr. Scharlene Gloss  Reason for consult:  headaches  Dear Dr Linus Salmons:  Thank you for your kind referral of Keishawn Rajewski for consultation of the above symptoms. Although his history is well known to you, please allow me to reiterate it for the purpose of our medical record. Records and images were personally reviewed where available.  HISTORY OF PRESENT ILLNESS: This is a very pleasant 53 year old ambidextrous right-hand dominant man with a history of HIV doing well on Atripla with suppressed virus and good CD4 count, and migraines since age 38 or 79.  He reports that over the past month, migraines have increased in severity and frequency, with associated blurred vision.  Headaches are usually over the left side, with throbbing pain, photo/phonophobia, lasting up to 30-40 minutes. They can be triggered by smoke, sleep deprivation, and alcohol. He usually lies down in a dark room with a warm towel over his eyes.  He was initially having headaches every 3 months, but over the past month he would have them up to three times a day.  He presented to the ER on 12/20/13 due to severe headache with numbness on his left side.  The numbness was on his left hand (small finger, ring finger and middle finger) extending up the lateral aspect of his arm and then medial aspect of upper arm felt "tingly". In addition his lateral aspect of his left foot and back of his leg felt tingly. MRI brain without contrast done was incomplete due to increased headaches, only diffusion-weighted and T1-weighted imaging were done. MRA head did not show any significant stenosis. Echo and carotid ultrasound unremarkable. He was able to have an MRI C-spine which showed disc space narrowing with degenerative changes of the endplates with small broad-based osteophytes which slightly narrow the lateral  recesses and neural foramina bilaterally at C4-5, with edema in the endplates and disc asymmetric to the left, no focal neural impingement noted. 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 He was started on gabapentin 300mg  TID and daily baby aspirin.  He reports that the left-sided symptoms lasted 2 weeks, however if he lifts something or overexerts himself, he again feels a different sensation on his left side. He has completed physical therapy.  He denies any falls but does state that he moved apartments 2 months ago and did a lot of lifting.  He feels the gabapentin helps some, in the past he only would get 3 hours of sleep, now he gets 5-6 hours.  He continues to have headaches, last week a headache woke him up at 2am and he thought he would need to go back to the ER.  He denies any side effects to gabapentin.  He denies any diplopia but reports floaters. No dysarthria, dysphagia, nausea,vomiting, neck or back pain, bowel/bladder dysfunction.  He denies frequent cocaine use, stating he only took it 2 days prior to when the severe headaches started.  There is no family history of headaches.  Laboratory Data: Lab Results  Component Value Date   WBC 4.3 12/21/2013   HGB 12.4* 12/21/2013   HCT 37.4* 12/21/2013   MCV 89.7 12/21/2013   PLT 162 12/21/2013     Chemistry      Component Value Date/Time   NA 145 12/20/2013 0601   K 4.1 12/20/2013 0601   CL 111 12/20/2013 0601   CO2  23 12/20/2013 0601   BUN 20 12/20/2013 0601   CREATININE 1.18 12/20/2013 0601   CREATININE 1.16 12/10/2013 0945      Component Value Date/Time   CALCIUM 8.6 12/20/2013 0601   ALKPHOS 72 12/20/2013 0601   AST 15 12/20/2013 0601   ALT 6 12/20/2013 0601   BILITOT <0.2* 12/20/2013 0601      PAST MEDICAL HISTORY: Past Medical History  Diagnosis Date  . HIV infection   . Migraine    Lab Results  Component Value Date   HGBA1C 5.9* 12/20/2013   Lab Results  Component Value Date   CHOL 167 12/20/2013   HDL 61  12/20/2013   LDLCALC 88 12/20/2013   TRIG 88 12/20/2013   CHOLHDL 2.7 12/20/2013    PAST SURGICAL HISTORY: Past Surgical History  Procedure Laterality Date  . Appendectomy  1990    MEDICATIONS: Current Outpatient Prescriptions on File Prior to Visit  Medication Sig Dispense Refill  . aspirin EC 81 MG tablet Take 1 tablet (81 mg total) by mouth daily.  30 tablet  0  . elvitegravir-cobicistat-emtricitabine-tenofovir (STRIBILD) 150-150-200-300 MG TABS tablet Take 1 tablet by mouth daily. Take with food  30 tablet  5  Gabapentin 300mg  TID No current facility-administered medications on file prior to visit.    ALLERGIES: Allergies  Allergen Reactions  . Nicotine Step [Nicotine] Palpitations and Rash    Smoker's patch    FAMILY HISTORY: Family History  Problem Relation Age of Onset  . Brain cancer Mother   . Testicular cancer Father   . Cancer Mother   . Cancer Father   . Stroke Mother   . Stroke Father   . Hypertension Father   . Diabetes Father     SOCIAL HISTORY: History   Social History  . Marital Status: Single    Spouse Name: N/A    Number of Children: N/A  . Years of Education: N/A   Occupational History  . Not on file.   Social History Main Topics  . Smoking status: Current Every Day Smoker -- 0.50 packs/day for 30 years    Types: Cigarettes  . Smokeless tobacco: Never Used     Comment: trying to quit.  . Alcohol Use: 0.0 oz/week     Comment: occasional  . Drug Use: No  . Sexual Activity: Not on file     Comment: given condoms   Other Topics Concern  . Not on file   Social History Narrative  . No narrative on file    REVIEW OF SYSTEMS: Constitutional: No fevers, chills, or sweats, no generalized fatigue, change in appetite Eyes: as above Ear, nose and throat: No hearing loss, ear pain, nasal congestion, sore throat Cardiovascular: No chest pain, palpitations Respiratory:  No shortness of breath at rest or with exertion, wheezes GastrointestinaI:  No nausea, vomiting, diarrhea, abdominal pain, fecal incontinence Genitourinary:  No dysuria, urinary retention or frequency Musculoskeletal:  No neck pain, back pain Integumentary: No rash, pruritus, skin lesions Neurological: as above Psychiatric: No depression, insomnia, anxiety Endocrine: No palpitations, fatigue, diaphoresis, mood swings, change in appetite, change in weight, increased thirst Hematologic/Lymphatic:  No anemia, purpura, petechiae. Allergic/Immunologic: no itchy/runny eyes, nasal congestion, recent allergic reactions, rashes  PHYSICAL EXAM: Filed Vitals:   01/17/14 1023  BP: 112/70  Pulse: 63  Temp: 98.2 F (36.8 C)   General: No acute distress Head:  Normocephalic/atraumatic Eyes: Fundoscopic exam shows bilateral sharp discs, no vessel changes, exudates, or hemorrhages Neck: supple, no paraspinal tenderness, full  range of motion Back: No paraspinal tenderness Heart: regular rate and rhythm Lungs: Clear to auscultation bilaterally. Vascular: No carotid bruits. Skin/Extremities: No rash, no edema Neurological Exam: Mental status: alert and oriented to person, place, and time, no dysarthria or aphasia, Fund of knowledge is appropriate.  Recent and remote memory are intact.  Attention and concentration are normal.    Able to name objects and repeat phrases. Cranial nerves: CN I: not tested CN II: pupils equal, round and reactive to light, visual fields intact, fundi unremarkable. CN III, IV, VI:  full range of motion, no nystagmus, no ptosis CN V: facial sensation intact CN VII: upper and lower face symmetric CN VIII: hearing intact to finger rub CN IX, X: gag intact, uvula midline CN XI: sternocleidomastoid and trapezius muscles intact CN XII: tongue midline Bulk & Tone: normal, no fasciculations. Motor: 5/5 throughout with no pronator drift. Sensation: reports hypesthesia to pin on the left LE. Otherwise intact to light touch, cold, vibration and joint  position sense.  No extinction to double simultaneous stimulation.  Romberg test negative Deep Tendon Reflexes: +1 throughout, no ankle clonus, negative Hoffman sign Plantar responses: downgoing bilaterally Cerebellar: no incoordination on finger to nose, heel to shin. No dysdiadochokinesia Gait: narrow-based and steady, able to tandem walk adequately. Tremor: none  IMPRESSION: This is a very pleasant 53 year old right-handed man with a history of HIV with good response to Atripla (suppressed virus, normal CD4 count) and headaches with migrainous features that have significantly increased over the past month. He had left-sided paresthesias last month and presented to the ER where MRI brain done was incomplete with only 2 sequences obtained. A repeat MRI brain with and without contrast will be ordered to assess for underlying structural abnormality.  The distribution on numbness on his left UE is suggestive of ulnar neuropathy, he reports increased sensitivity to pin on the left LE. EMG/NCV of the left UE and LE will be ordered to further evaluate his symptoms.  He will increase gabapentin to 1-1-2 caps for symptomatic treatment.  Continue daily baby aspirin. We discussed cocaine use, he reports only using it once and knows to avoid it. He will follow-up in 3 months and knows to go to the ER if symptoms progress/worsen.  Thank you for allowing me to participate in the care of this patient. Please do not hesitate to call for any questions or concerns.   Ellouise Newer, M.D.  CC: Dr. Linus Salmons

## 2014-01-18 ENCOUNTER — Encounter: Payer: Self-pay | Admitting: Neurology

## 2014-01-22 ENCOUNTER — Telehealth: Payer: Self-pay | Admitting: Neurology

## 2014-01-22 NOTE — Telephone Encounter (Signed)
Where is imaging scheduled? Which [facility] location? Please call pt / Gayleen Orem,

## 2014-01-25 ENCOUNTER — Ambulatory Visit
Admission: RE | Admit: 2014-01-25 | Discharge: 2014-01-25 | Disposition: A | Discharge status: Home / Self Care | Payer: Medicare Other | Source: Ambulatory Visit | Attending: Neurology | Admitting: Neurology

## 2014-01-25 DIAGNOSIS — Z21 Asymptomatic human immunodeficiency virus [HIV] infection status: Secondary | ICD-10-CM | POA: Diagnosis not present

## 2014-01-25 DIAGNOSIS — G43909 Migraine, unspecified, not intractable, without status migrainosus: Secondary | ICD-10-CM | POA: Diagnosis not present

## 2014-01-25 MED ORDER — GADOBENATE DIMEGLUMINE 529 MG/ML IV SOLN
20.0000 mL | Freq: Once | INTRAVENOUS | Status: AC | PRN
  Administered 2014-01-25: 20 mL via INTRAVENOUS

## 2014-02-03 DIAGNOSIS — R51 Headache: Secondary | ICD-10-CM | POA: Diagnosis not present

## 2014-02-05 ENCOUNTER — Ambulatory Visit (INDEPENDENT_AMBULATORY_CARE_PROVIDER_SITE_OTHER): Payer: Medicare Other

## 2014-02-05 DIAGNOSIS — Z23 Encounter for immunization: Secondary | ICD-10-CM

## 2014-03-03 ENCOUNTER — Ambulatory Visit (INDEPENDENT_AMBULATORY_CARE_PROVIDER_SITE_OTHER): Payer: Medicare Other | Admitting: Neurology

## 2014-03-03 DIAGNOSIS — R200 Anesthesia of skin: Secondary | ICD-10-CM

## 2014-03-03 NOTE — Procedures (Signed)
[facility]  [address]  [place], [place] [address] Tel: [phone] Fax:  [phone] Test Date:  03/03/2014  Patient: John Bullock DOB: 04-Jan-1961 Physician: Narda Amber  Sex: Male Height: 6' " Ref Phys: Ellouise Newer  ID#: [id number] Temp: 34.0C Technician: Laureen Ochs R. NCS T.   Patient Complaints: Patient is a 53 year old male here for evaluation of his left arm and leg numbness and tingling.  NCV & EMG Findings: Extensive electrodiagnostic testing of the left upper, left lower and additional studies of the right lower extremity shows:  1. All sensory responses in the upper extremity including the median, ulnar, and radial are reduced in amplitude. Median and ulnar sensory response is also mildly prolonged, however this may be technical in nature, as longer distances were used. 2. Ulnar motor response shows slowing across the elbow. The median motor responses within normal limits. 3. Bilateral sural and left superficial peroneal sensory responses are absent. The right superficial peroneal sensory response is reduced in amplitude. 4. Bilateral peroneal motor responses recording at the extensor digitorum brevis are reduced, and normal when recording at the tibialis anterior. Tibial motor response is reduced on the left and borderline low on the right.  5. Ulnar F wave is prolonged. Bilateral tibial F wave and H reflex studies shows no response. 6. Chronic motor axon loss changes are seen affecting the tibialis anterior, medial gastrocnemius, flexor digitorum longus, and first dorsal interosseous muscles. There is no accompanied active denervation.  Impression: 1. 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 Overall, these findings are moderate in degree electrically. 2. There is no evidence of a superimposed cervical or  lumbosacral radiculopathy.   ___________________________ Narda Amber    Nerve Conduction Studies Anti Sensory Summary Table   Stim Site NR Peak (ms) Norm Peak (ms) P-T Amp (V) Norm P-T Amp  Left Median Anti Sensory (2nd Digit)  35C    15 cm pt is 6'0  Wrist    4.0 <3.6 12.7 >15  Left Radial Anti Sensory (Base 1st Digit)  34C  Wrist    2.7 <2.7 8.5 >14  Left Sup Peroneal Anti Sensory (Ant Lat Mall)  35C  12 cm NR  <4.6  >4  Right Sup Peroneal Anti Sensory (Ant Lat Mall)  34C  12 cm    3.0 <4.6 2.4 >4  Left Sural Anti Sensory (Lat Mall)  Calf NR  <4.6  >4  Right Sural Anti Sensory (Lat Mall)  34C  Calf NR  <4.6  >4  Left Ulnar Anti Sensory (5th Digit)  35C    13 cm  Wrist    3.3 <3.1 8.0 >10   Motor Summary Table   Stim Site NR Onset (ms) Norm Onset (ms) O-P Amp (mV) Norm O-P Amp Site1 Site2 Delta-0 (ms) Dist (cm) Vel (m/s) Norm Vel (m/s)  Left Median Motor (Abd Poll Brev)  34C  Wrist    4.1 <4.0 11.9 >6 Elbow Wrist 6.5 33.0 51 >50  Elbow    10.6  11.0         Left Peroneal Motor (Ext Dig Brev)  34C  Ankle    7.0 <6.0 1.3 >2.5 B Fib Ankle 9.5 42.0 44 >40  B Fib    16.5  1.6  Poplt B Fib 1.9 11.0 58 >40  Poplt    18.4  1.6  Medial ankle    6.3  1.0         Right Peroneal Motor (Ext Dig Brev)  34C  Ankle    6.0 <6.0 0.4 >2.5 B Fib Ankle  0.0  >40  B Fib NR     Poplt B Fib  0.0  >40  Poplt NR            Left Peroneal TA Motor (Tib Ant)  34C  Fib Head    2.3 <4.5 6.2 >3 Poplit Fib Head 1.8 11.0 61 >40  Poplit    4.1  5.5         Right Peroneal TA Motor (Tib Ant)  34C  Fib Head    3.4 <4.5 6.8 >3 Poplit Fib Head 1.5 10.0 67 >40  Poplit    4.9  6.7         Left Tibial Motor (Abd Hall Brev)  34C  Ankle    3.0 <6.0 3.3 >4 Knee Ankle 12.2 48.0 39 >40  Knee    15.2  1.3         Right Tibial Motor (Abd Hall Brev)  34C    Multiple attempts at pop fossa  Ankle    5.5 <6.0 4.0 >4 Knee Ankle 11.5 46.0 40 >40  Knee    17.0  1.9         Left Ulnar Motor (Abd  Dig Minimi)  34C  Wrist    2.7 <3.1 10.0 >7 B Elbow Wrist 5.7 29.0 51 >50  B Elbow    8.4  9.4  A Elbow B Elbow 3.1 10.0 32 >50  A Elbow    11.5  7.6          F Wave Studies   NR F-Lat (ms) Lat Norm (ms) L-R F-Lat (ms)  Left Tibial (Mrkrs) (Abd Hallucis)  34C  NR  <55   Right Tibial (Mrkrs) (Abd Hallucis)  34C  NR  <55   Left Ulnar (Mrkrs) (Abd Dig Min)  34C     38.42 <33    H Reflex Studies   NR H-Lat (ms) Lat Norm (ms) L-R H-Lat (ms)  Left Tibial (Gastroc)  35C  NR  <35   Right Tibial (Gastroc)  35C  NR  <35    EMG   Side Muscle Ins Act Fibs Psw Fasc Number Recrt Dur Dur. Amp Amp. Poly Poly. Comment  Left 1stDorInt Nml Nml Nml Nml 1- Mod-V Few 1+ Few 1+ Nml Nml N/A  Left Ext Indicis Nml Nml Nml Nml Nml Nml Nml Nml Nml Nml Nml Nml N/A  Left ABD Dig Min Nml Nml Nml Nml Nml Nml Nml Nml Nml Nml Nml Nml N/A  Left PronatorTeres Nml Nml Nml Nml Nml Nml Nml Nml Nml Nml Nml Nml N/A  Left Biceps Nml Nml Nml Nml Nml Nml Nml Nml Nml Nml Nml Nml N/A  Left Triceps Nml Nml Nml Nml Nml Nml Nml Nml Nml Nml Nml Nml N/A  Left Deltoid Nml Nml Nml Nml Nml Nml Nml Nml Nml Nml Nml Nml N/A  Left FlexDigProf 4,5 Nml Nml Nml Nml Nml Nml Nml Nml Nml Nml Nml Nml N/A  Left Lumbo Parasp Low Nml Nml Nml Nml NE - - - - - - - N/A  Left GluteusMed Nml Nml Nml Nml Nml Nml Nml Nml Nml Nml Nml Nml N/A  Left BicepsFemS Nml Nml Nml Nml Nml Nml Nml Nml Nml Nml Nml Nml N/A  Left AntTibialis Nml Nml  Nml Nml 1- Mod-R Some 1+ Some 1+ Nml Nml N/A  Left Gastroc Nml Nml Nml Nml 1- Mod-R Some 1+ Some 1+ Nml Nml N/A  Left Flex Dig Long Nml Nml Nml Nml 1- Mod-R Some 1+ Some 1+ Nml Nml N/A  Right AntTibialis Nml Nml Nml Nml 1- Mod-R Some 1+ Some 1+ Nml Nml N/A  Right Gastroc Nml Nml Nml Nml 1- Mod-R Some 1+ Some 1+ Nml Nml N/A  Right RectFemoris Nml Nml Nml Nml Nml Nml Nml Nml Nml Nml Nml Nml N/A  Left Cervical Parasp Low Nml Nml Nml Nml NE - - - - - - - N/A      Waveforms:

## 2014-03-07 ENCOUNTER — Telehealth: Payer: Self-pay | Admitting: Neurology

## 2014-03-07 DIAGNOSIS — R200 Anesthesia of skin: Secondary | ICD-10-CM

## 2014-03-07 NOTE — Telephone Encounter (Signed)
Discussed EMG showing moderate length-dependent neuropathy. Would do further neuropathy labs. Patient reported drowsiness on gabapentin 300,300, 600mg . He will reduce to 300mg  in AM, 600mg  in PM.   Tiffany, pls call him Monday for instruction for the following labs: vitamin B12, vitamin B6, B1, SPEP/IFE  Thanks

## 2014-03-10 NOTE — Addendum Note (Signed)
Addended by: Thurmon Fair on: 03/10/2014 02:16 PM   Modules accepted: Orders

## 2014-03-10 NOTE — Telephone Encounter (Signed)
Labs ordered. Spoke with patient, orders left up front for p/u. He is going to come pick up orders on 11/2 & go down to [facility] to have blood drawn.

## 2014-03-17 DIAGNOSIS — R200 Anesthesia of skin: Secondary | ICD-10-CM | POA: Diagnosis not present

## 2014-03-17 DIAGNOSIS — R209 Unspecified disturbances of skin sensation: Secondary | ICD-10-CM | POA: Diagnosis not present

## 2014-03-18 LAB — VITAMIN B12: Vitamin B-12: 333 pg/mL (ref 211–911)

## 2014-03-19 LAB — PROTEIN ELECTROPHORESIS, SERUM
Albumin ELP: 61.9 % (ref 55.8–66.1)
Alpha-1-Globulin: 4.2 % (ref 2.9–4.9)
Alpha-2-Globulin: 8 % (ref 7.1–11.8)
Beta 2: 4.2 % (ref 3.2–6.5)
Beta Globulin: 5.7 % (ref 4.7–7.2)
Gamma Globulin: 16 % (ref 11.1–18.8)
Total Protein, Serum Electrophoresis: 6.9 g/dL (ref 6.0–8.3)

## 2014-03-19 LAB — IMMUNOFIXATION ELECTROPHORESIS
IgA: 98 mg/dL (ref 68–379)
IgG (Immunoglobin G), Serum: 1020 mg/dL (ref 650–1600)
IgM, Serum: 42 mg/dL (ref 41–251)
Total Protein, Serum Electrophoresis: 6.8 g/dL (ref 6.0–8.3)

## 2014-03-20 LAB — VITAMIN B6: Vitamin B6: 18.9 ng/mL (ref 2.1–21.7)

## 2014-03-20 LAB — VITAMIN B1: Vitamin B1 (Thiamine): 13 nmol/L (ref 8–30)

## 2014-04-09 ENCOUNTER — Other Ambulatory Visit: Payer: Self-pay | Admitting: Internal Medicine

## 2014-04-09 DIAGNOSIS — B20 Human immunodeficiency virus [HIV] disease: Secondary | ICD-10-CM

## 2014-04-14 ENCOUNTER — Encounter: Payer: Self-pay | Admitting: Neurology

## 2014-04-16 ENCOUNTER — Other Ambulatory Visit (HOSPITAL_COMMUNITY)
Admission: RE | Admit: 2014-04-16 | Discharge: 2014-04-16 | Disposition: A | Discharge status: Home / Self Care | Payer: Medicare Other | Source: Ambulatory Visit | Attending: Internal Medicine | Admitting: Internal Medicine

## 2014-04-16 ENCOUNTER — Other Ambulatory Visit: Payer: Medicare Other

## 2014-04-16 DIAGNOSIS — B20 Human immunodeficiency virus [HIV] disease: Secondary | ICD-10-CM

## 2014-04-16 DIAGNOSIS — Z113 Encounter for screening for infections with a predominantly sexual mode of transmission: Secondary | ICD-10-CM | POA: Diagnosis not present

## 2014-04-16 LAB — CBC WITH DIFFERENTIAL/PLATELET
Basophils Absolute: 0 10*3/uL (ref 0.0–0.1)
Basophils Relative: 0 % (ref 0–1)
Eosinophils Absolute: 0 10*3/uL (ref 0.0–0.7)
Eosinophils Relative: 1 % (ref 0–5)
HCT: 40.6 % (ref 39.0–52.0)
Hemoglobin: 14.1 g/dL (ref 13.0–17.0)
Lymphocytes Relative: 34 % (ref 12–46)
Lymphs Abs: 1.5 10*3/uL (ref 0.7–4.0)
MCH: 29.3 pg (ref 26.0–34.0)
MCHC: 34.7 g/dL (ref 30.0–36.0)
MCV: 84.4 fL (ref 78.0–100.0)
MPV: 10.6 fL (ref 9.4–12.4)
Monocytes Absolute: 0.4 10*3/uL (ref 0.1–1.0)
Monocytes Relative: 9 % (ref 3–12)
Neutro Abs: 2.5 10*3/uL (ref 1.7–7.7)
Neutrophils Relative %: 56 % (ref 43–77)
Platelets: 183 10*3/uL (ref 150–400)
RBC: 4.81 MIL/uL (ref 4.22–5.81)
RDW: 14.2 % (ref 11.5–15.5)
WBC: 4.4 10*3/uL (ref 4.0–10.5)

## 2014-04-16 LAB — COMPLETE METABOLIC PANEL WITH GFR
ALT: 10 U/L (ref 0–53)
AST: 21 U/L (ref 0–37)
Albumin: 4.4 g/dL (ref 3.5–5.2)
Alkaline Phosphatase: 50 U/L (ref 39–117)
BUN: 16 mg/dL (ref 6–23)
CO2: 28 mEq/L (ref 19–32)
Calcium: 8.9 mg/dL (ref 8.4–10.5)
Chloride: 107 mEq/L (ref 96–112)
Creat: 1.17 mg/dL (ref 0.50–1.35)
GFR, Est African American: 82 mL/min
GFR, Est Non African American: 71 mL/min
Glucose, Bld: 116 mg/dL — ABNORMAL HIGH (ref 70–99)
Potassium: 4 mEq/L (ref 3.5–5.3)
Sodium: 141 mEq/L (ref 135–145)
Total Bilirubin: 0.6 mg/dL (ref 0.2–1.2)
Total Protein: 6.9 g/dL (ref 6.0–8.3)

## 2014-04-16 LAB — RPR

## 2014-04-16 NOTE — Addendum Note (Signed)
Addended by: Reggy Eye on: 04/16/2014 10:35 AM   Modules accepted: Orders

## 2014-04-17 LAB — HIV-1 RNA QUANT-NO REFLEX-BLD
HIV 1 RNA Quant: <20 copies/mL (ref <20)
HIV-1 RNA Quant, Log: <1.3 {Log} (ref <1.30)

## 2014-04-17 LAB — URINE CYTOLOGY ANCILLARY ONLY
Chlamydia: NEGATIVE
Neisseria Gonorrhea: NEGATIVE

## 2014-04-17 LAB — T-HELPER CELL (CD4) - (RCID CLINIC ONLY)
CD4 % Helper T Cell: 30 % — ABNORMAL LOW (ref 33–55)
CD4 T Cell Abs: 490 /uL (ref 400–2700)

## 2014-04-18 ENCOUNTER — Ambulatory Visit: Payer: Medicare Other | Admitting: Neurology

## 2014-04-23 ENCOUNTER — Ambulatory Visit (INDEPENDENT_AMBULATORY_CARE_PROVIDER_SITE_OTHER): Payer: Medicare Other | Admitting: Neurology

## 2014-04-23 ENCOUNTER — Encounter: Payer: Self-pay | Admitting: Neurology

## 2014-04-23 VITALS — BP 120/78 | HR 58 | Resp 16 | Ht 72.0 in | Wt 217.0 lb

## 2014-04-23 DIAGNOSIS — G43009 Migraine without aura, not intractable, without status migrainosus: Secondary | ICD-10-CM | POA: Diagnosis not present

## 2014-04-23 DIAGNOSIS — I639 Cerebral infarction, unspecified: Secondary | ICD-10-CM | POA: Diagnosis not present

## 2014-04-23 DIAGNOSIS — G609 Hereditary and idiopathic neuropathy, unspecified: Secondary | ICD-10-CM

## 2014-04-23 NOTE — Patient Instructions (Signed)
1. Restart Gabapentin 300mg : take 1 capsule at bedtime. If symptoms at night continue, increase to 2 caps at bedtime 2. Follow-up in 2-3 months

## 2014-04-23 NOTE — Progress Notes (Signed)
NEUROLOGY FOLLOW UP OFFICE NOTE  John Bullock [id number]  HISTORY OF PRESENT ILLNESS: I had the pleasure of seeing John Bullock in follow-up in the neurology clinic on 04/23/2014.  The patient was last seen 3 months ago for worsening headaches and left-sided paresthesias. Records and images were personally reviewed where available.  I personally reviewed MRI brain w/ and w/o contrast which did not show any acute changes, there was mild bilateral chronic microvascular change seen, no abnormal enhancement.   EMG/NCV of the left upper and lower extremities showed all sensory responses in the upper extremity including the median, ulnar, and radial are reduced in amplitude. Ulnar motor response shows slowing across the elbow. Bilateral sural and left superficial peroneal sensory responses are absent. The right superficial peroneal sensory response is reduced in amplitude.Bilateral peroneal motor responses recording at the extensor digitorum brevis are reduced, and normal when recording at the tibialis anterior. Tibial motor response is reduced on the left and borderline low on the right. Ulnar F wave is prolonged. Bilateral tibial F wave and H reflex studies shows no response. Chronic motor axon loss changes are seen affecting the tibialis anterior, medial gastrocnemius, flexor digitorum longus, and first dorsal interosseous muscles. There is no accompanied active denervation. Findings were most consistent with a chronic sensorimotor polyneuropathy, axon loss and demyelinating in type, conforming to a length-dependent pattern, overall moderate in degree electrically. Additional neuropathy labs, including vitamin B1, B6, and SPEP/IFE were normal. B12 level was 333, he started taking B12 541mcg supplements.  He reports that soon after his initial visit, he started the gabapentin and the headaches resolved. The left-sided numbness improved as well. He had increased dose of gabapentin and felt that this  caused insomnia, discontinuing the medication. He did note that he was switched to Stribild around that time as well. He reports pressure in the balls of both feet, worse at night. He denies any falls.  HPI: This is a very pleasant 53 yo RH man with a history of HIV doing well on Atripla with suppressed virus and good CD4 count, and migraines since age 36 or 61. He presented with worsening migraines that had increased in severity and frequency, with associated blurred vision. Headaches are usually over the left side, with throbbing pain, photo/phonophobia, lasting up to 30-40 minutes. They can be triggered by smoke, sleep deprivation, and alcohol. He usually lies down in a dark room with a warm towel over his eyes. He was initially having headaches every 3 months, increased to up to three times a day. He presented to the ER on 12/20/13 due to severe headache with numbness on his left side. The numbness was on his left hand (small finger, ring finger and middle finger) extending up the lateral aspect of his arm and then medial aspect of upper arm felt "tingly". In addition his lateral aspect of his left foot and back of his leg felt tingly. MRI brain without contrast done was incomplete due to increased headaches, only diffusion-weighted and T1-weighted imaging were done. MRA head did not show any significant stenosis. Echo and carotid ultrasound unremarkable. He was able to have an MRI C-spine which showed disc space narrowing with degenerative changes of the endplates with small broad-based osteophytes which slightly narrow the lateral recesses and neural foramina bilaterally at C4-5, with edema in the endplates and disc asymmetric to the left, no focal neural impingement noted. 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 He was started on gabapentin 300mg  TID  and daily baby aspirin.  PAST MEDICAL HISTORY: Past Medical History  Diagnosis Date  . HIV infection   .  Migraine     MEDICATIONS: Current Outpatient Prescriptions on File Prior to Visit  Medication Sig Dispense Refill  . aspirin EC 81 MG tablet Take 1 tablet (81 mg total) by mouth daily. 30 tablet 0  . STRIBILD 150-150-200-300 MG TABS tablet TAKE 1 TABLET BY MOUTH DAILY. TAKE WITH FOOD 30 tablet 3   No current facility-administered medications on file prior to visit.    ALLERGIES: Allergies  Allergen Reactions  . Nicotine Step [Nicotine] Palpitations and Rash    Smoker's patch    FAMILY HISTORY: Family History  Problem Relation Age of Onset  . Brain cancer Mother   . Testicular cancer Father   . Cancer Mother   . Cancer Father   . Stroke Mother   . Stroke Father   . Hypertension Father   . Diabetes Father     SOCIAL HISTORY: History   Social History  . Marital Status: Single    Spouse Name: N/A    Number of Children: N/A  . Years of Education: N/A   Occupational History  . Not on file.   Social History Main Topics  . Smoking status: Current Every Day Smoker -- 0.50 packs/day for 30 years    Types: Cigarettes  . Smokeless tobacco: Never Used     Comment: trying to quit.  . Alcohol Use: 0.0 oz/week     Comment: occasional  . Drug Use: No  . Sexual Activity: Not on file     Comment: given condoms   Other Topics Concern  . Not on file   Social History Narrative    REVIEW OF SYSTEMS: Constitutional: No fevers, chills, or sweats, no generalized fatigue, change in appetite Eyes: No visual changes, double vision, eye pain Ear, nose and throat: No hearing loss, ear pain, nasal congestion, sore throat Cardiovascular: No chest pain, palpitations Respiratory:  No shortness of breath at rest or with exertion, wheezes GastrointestinaI: No nausea, vomiting, diarrhea, abdominal pain, fecal incontinence Genitourinary:  No dysuria, urinary retention or frequency Musculoskeletal:  No neck pain, back pain Integumentary: No rash, pruritus, skin lesions Neurological: as  above Psychiatric: No depression, insomnia, anxiety Endocrine: No palpitations, fatigue, diaphoresis, mood swings, change in appetite, change in weight, increased thirst Hematologic/Lymphatic:  No anemia, purpura, petechiae. Allergic/Immunologic: no itchy/runny eyes, nasal congestion, recent allergic reactions, rashes  PHYSICAL EXAM: Filed Vitals:   04/23/14 0939  BP: 120/78  Pulse: 58  Resp: 16   General: No acute distress Head:  Normocephalic/atraumatic Neck: supple, no paraspinal tenderness, full range of motion Heart:  Regular rate and rhythm Lungs:  Clear to auscultation bilaterally Back: No paraspinal tenderness Skin/Extremities: No rash, no edema Neurological Exam: alert and oriented to person, place, and time. No aphasia or dysarthria. Fund of knowledge is appropriate.  Recent and remote memory are intact.  Attention and concentration are normal.    Able to name objects and repeat phrases. Cranial nerves: Pupils equal, round, reactive to light.  Fundoscopic exam unremarkable, no papilledema. Extraocular movements intact with no nystagmus. Visual fields full. Facial sensation intact. No facial asymmetry. Tongue, uvula, palate midline.  Motor: Bulk and tone normal, muscle strength 5/5 throughout with no pronator drift.  Sensation to light touch intact.  No extinction to double simultaneous stimulation.  Deep tendon reflexes 1+ throughout, toes downgoing.  Finger to nose testing intact.  Gait narrow-based and steady, able to  tandem walk adequately.  Romberg negative.  IMPRESSION: This is a very pleasant 53 yo RH man with a history of HIV with good response to antiretroviral treatment (suppressed virus, normal CD4 count), who presented with worsening migraines and left-sided paresthesias. MRI brain unremarkable. Symptoms have resolved, he had discontinued the gabapentin. He reports discomfort and paresthesias in both feet. EMG/NCV consistent with chronic sensorimotor polyneuropathy.  Neuropathy labs unremarkable. We discussed symptomatic treatment of neuropathy, he will restart gabapentin 300mg  qhs, and if symptoms continue, increase to 600mg  qhs. Consideration for tricyclics will be done if symptoms continue. He will follow-up in 2-3 months.   Thank you for allowing me to participate in his care.  Please do not hesitate to call for any questions or concerns.  The duration of this appointment visit was 15 minutes of face-to-face time with the patient.  Greater than 50% of this time was spent in counseling, explanation of diagnosis, planning of further management, and coordination of care.   Ellouise Newer, M.D.   CC: Dr. Linus Salmons

## 2014-04-24 ENCOUNTER — Encounter: Payer: Self-pay | Admitting: Neurology

## 2014-05-01 ENCOUNTER — Encounter: Payer: Self-pay | Admitting: Internal Medicine

## 2014-05-01 ENCOUNTER — Ambulatory Visit (INDEPENDENT_AMBULATORY_CARE_PROVIDER_SITE_OTHER): Payer: Medicare Other | Admitting: Internal Medicine

## 2014-05-01 ENCOUNTER — Telehealth: Payer: Self-pay | Admitting: *Deleted

## 2014-05-01 VITALS — BP 125/91 | HR 79 | Temp 98.1°F | Ht 73.0 in | Wt 215.0 lb

## 2014-05-01 DIAGNOSIS — B20 Human immunodeficiency virus [HIV] disease: Secondary | ICD-10-CM | POA: Diagnosis not present

## 2014-05-01 DIAGNOSIS — I639 Cerebral infarction, unspecified: Secondary | ICD-10-CM

## 2014-05-01 MED ORDER — ELVITEG-COBIC-EMTRICIT-TENOFAF 150-150-200-10 MG PO TABS: 1.0000 | ORAL_TABLET | Freq: Every day | ORAL | Status: DC

## 2014-05-01 NOTE — Telephone Encounter (Signed)
Received notification that John Bullock is not yet on formulary (medicare/medicaid).  Please advise if you want to pursue a prior authorization or if you want to prescribe Stribild until the formulary is updated. Landis Gandy, RN

## 2014-05-01 NOTE — Telephone Encounter (Signed)
stribild until updated. thanks

## 2014-05-01 NOTE — Progress Notes (Signed)
   Subjective:    Patient ID: John Bullock, male    DOB: 28-Nov-1960, 53 y.o.   MRN: [id number]  HPI Here for 042 follow up.  For his HIV, his last labs are good with CD4 of 490 and continued undetectable vl.  No new issues.  Continues on Stribild.          Review of Systems  Constitutional: Negative for fever and fatigue.  HENT: Negative for trouble swallowing.   Respiratory: Negative for cough.   Cardiovascular: Negative for chest pain.  Gastrointestinal: Negative for nausea and diarrhea.  Skin: Negative for rash.  Neurological: Negative for dizziness.       Objective:   Physical Exam  Constitutional: He appears well-developed and well-nourished.  HENT:  Mouth/Throat: No oropharyngeal exudate.  Eyes: No scleral icterus.  Cardiovascular: Normal rate, regular rhythm and normal heart sounds.   No murmur heard. Pulmonary/Chest: Effort normal and breath sounds normal. No respiratory distress.  Lymphadenopathy:    He has no cervical adenopathy.          Assessment & Plan:

## 2014-05-05 ENCOUNTER — Other Ambulatory Visit: Payer: Self-pay | Admitting: *Deleted

## 2014-05-05 MED ORDER — ELVITEG-COBIC-EMTRICIT-TENOFDF 150-150-200-300 MG PO TABS: 1.0000 | ORAL_TABLET | Freq: Every day | ORAL | Status: DC

## 2014-05-05 NOTE — Assessment & Plan Note (Signed)
Doing well. Will change to [facility] when approved by medicare (currently not). RTC 4 months.

## 2014-05-05 NOTE — Telephone Encounter (Signed)
Sent 6 months of Stribild with the  Note that it is to be dispensed in place of Genvoya until [facility] is approved.

## 2014-05-12 ENCOUNTER — Other Ambulatory Visit: Payer: Self-pay | Admitting: *Deleted

## 2014-05-12 ENCOUNTER — Telehealth: Payer: Self-pay | Admitting: *Deleted

## 2014-05-12 MED ORDER — ELVITEG-COBIC-EMTRICIT-TENOFAF 150-150-200-10 MG PO TABS: 1.0000 | ORAL_TABLET | Freq: Every day | ORAL | Status: DC

## 2014-05-12 NOTE — Telephone Encounter (Signed)
Genvoya approved 02/07/14 - 05/08/15.  Stribild prescription cancelled at [facility].

## 2014-05-19 ENCOUNTER — Ambulatory Visit (INDEPENDENT_AMBULATORY_CARE_PROVIDER_SITE_OTHER): Payer: Medicare Other | Admitting: *Deleted

## 2014-05-19 DIAGNOSIS — Z23 Encounter for immunization: Secondary | ICD-10-CM | POA: Diagnosis not present

## 2014-06-12 ENCOUNTER — Other Ambulatory Visit: Payer: Self-pay | Admitting: Neurology

## 2014-06-13 NOTE — Telephone Encounter (Signed)
Dr. Delice Lesch I called him after we received refill request from his pharmacy for the Gabapentin & directions were different than what we had documented that he was to be taking. He says he has been taking 300mg  2 capsules bid. He states that it's not helping with the pain. He wants an increase dosing or do you have any other med that you would recommend.

## 2014-06-13 NOTE — Telephone Encounter (Signed)
Left msg on VM to return my call. Need to verify directions with patient.

## 2014-06-13 NOTE — Telephone Encounter (Signed)
Increase to 2caps TID. Thanks

## 2014-06-13 NOTE — Telephone Encounter (Signed)
Called patient and notified him of changes. New Rx was sent to his pharmacy.

## 2014-06-17 ENCOUNTER — Ambulatory Visit (INDEPENDENT_AMBULATORY_CARE_PROVIDER_SITE_OTHER): Payer: Medicare Other | Admitting: Internal Medicine

## 2014-06-17 ENCOUNTER — Encounter: Payer: Self-pay | Admitting: Internal Medicine

## 2014-06-17 VITALS — BP 112/75 | HR 91 | Temp 98.0°F | Wt 217.6 lb

## 2014-06-17 DIAGNOSIS — M722 Plantar fascial fibromatosis: Secondary | ICD-10-CM

## 2014-06-17 DIAGNOSIS — Z21 Asymptomatic human immunodeficiency virus [HIV] infection status: Secondary | ICD-10-CM | POA: Diagnosis not present

## 2014-06-17 DIAGNOSIS — B20 Human immunodeficiency virus [HIV] disease: Secondary | ICD-10-CM

## 2014-06-17 MED ORDER — NAPROXEN 500 MG PO TABS: 500.0000 mg | ORAL_TABLET | Freq: Two times a day (BID) | ORAL | Status: DC

## 2014-06-17 NOTE — Assessment & Plan Note (Signed)
Patient is compliant with Genvoya.  Given potential nephrotoxic effect of NSAIDs and Genvoya, patient should have BMET rechecked in 1 month after completion of Naproxen for plantar fasciitis. Patient has good renal function and will be on naproxen for a short course.  Plan: -Continue [place] BMET at follow up visit for kidney function

## 2014-06-17 NOTE — Patient Instructions (Signed)
General Instructions:    Please bring your medicines with you each time you come to clinic.  Medicines may include prescription medications, over-the-counter medications, herbal remedies, eye drops, vitamins, or other pills.   PLEASE TRY NAPROXEN 500 MG TWICE A DAY FOR 3 WEEKS TRY EXERCISES, SHOE INSERTS, ICE AND STRETCHES   PLEASE CALL OR RETURN TO CLINIC IF PAIN IS NOT IMPROVING OR IF IT IS WORSENING  Plantar Fasciitis Plantar fasciitis is a common condition that causes foot pain. It is soreness (inflammation) of the band of tough fibrous tissue on the bottom of the foot that runs from the heel bone (calcaneus) to the ball of the foot. The cause of this soreness may be from excessive standing, poor fitting shoes, running on hard surfaces, being overweight, having an abnormal walk, or overuse (this is common in runners) of the painful foot or feet. It is also common in aerobic exercise dancers and ballet dancers. SYMPTOMS  Most people with plantar fasciitis complain of:  Severe pain in the morning on the bottom of their foot especially when taking the first steps out of bed. This pain recedes after a few minutes of walking.  Severe pain is experienced also during walking following a long period of inactivity.  Pain is worse when walking barefoot or up stairs DIAGNOSIS   Your caregiver will diagnose this condition by examining and feeling your foot.  Special tests such as X-rays of your foot, are usually not needed. PREVENTION   Consult a sports medicine professional before beginning a new exercise program.  Walking programs offer a good workout. With walking there is a lower chance of overuse injuries common to runners. There is less impact and less jarring of the joints.  Begin all new exercise programs slowly. If problems or pain develop, decrease the amount of time or distance until you are at a comfortable level.  Wear good shoes and replace them regularly.  Stretch your foot  and the heel cords at the back of the ankle (Achilles tendon) both before and after exercise.  Run or exercise on even surfaces that are not hard. For example, asphalt is better than pavement.  Do not run barefoot on hard surfaces.  If using a treadmill, vary the incline.  Do not continue to workout if you have foot or joint problems. Seek professional help if they do not improve. HOME CARE INSTRUCTIONS   Avoid activities that cause you pain until you recover.  Use ice or cold packs on the problem or painful areas after working out.  Only take over-the-counter or prescription medicines for pain, discomfort, or fever as directed by your caregiver.  Soft shoe inserts or athletic shoes with air or gel sole cushions may be helpful.  If problems continue or become more severe, consult a sports medicine caregiver or your own health care provider. Cortisone is a potent anti-inflammatory medication that may be injected into the painful area. You can discuss this treatment with your caregiver. MAKE SURE YOU:   Understand these instructions.  Will watch your condition.  Will get help right away if you are not doing well or get worse. Document Released: 01/25/2001 Document Revised: 07/25/2011 Document Reviewed: 03/26/2008 [facility] Patient Information 2015 [place], [place]. This information is not intended to replace advice given to you by your health care provider. Make sure you discuss any questions you have with your health care provider.

## 2014-06-17 NOTE — Progress Notes (Signed)
   Subjective:    Patient ID: John Bullock, male    DOB: Aug 29, 1960, 54 y.o.   MRN: [id number]  HPI John Bullock is a 54 yo male with PMHx of HIV and migraines who presents to the clinic with complaint of foot pain. Please see problem oriented assessment and plan for more information.  Review of Systems General: Denies fever, chills Respiratory: Denies SOB Cardiovascular: Denies chest pain and palpitations.  Gastrointestinal: Denies nausea, vomiting Musculoskeletal: Admits to right heel pain and gait problem. Denies joint swelling, and arthralgias.  Skin: Denies rash and wounds.  Neurological: Denies dizziness, headaches, weakness, lightheadedness  Past Medical History  Diagnosis Date  . HIV infection   . Migraine    Outpatient Encounter Prescriptions as of 06/17/2014  Medication Sig  . aspirin EC 81 MG tablet Take 1 tablet (81 mg total) by mouth daily.  Marland Kitchen elvitegravir-cobicistat-emtricitabine-tenofovir (GENVOYA) 150-150-200-10 MG TABS tablet Take 1 tablet by mouth daily with breakfast.  . gabapentin (NEURONTIN) 300 MG capsule Take 300 mg by mouth daily.  Marland Kitchen gabapentin (NEURONTIN) 300 MG capsule Take 2 capsules three times daily  . naproxen (NAPROSYN) 500 MG tablet Take 1 tablet (500 mg total) by mouth 2 (two) times daily with a meal.  . vitamin B-12 (CYANOCOBALAMIN) 500 MCG tablet Take 500 mcg by mouth daily.       Objective:   Physical Exam Filed Vitals:   06/17/14 1413  BP: 112/75  Pulse: 91  Weight: 217 lb 9.6 oz (98.703 kg)  SpO2: 98%   General: Vital signs reviewed.  Patient is well-developed and well-nourished, in no acute distress and cooperative with exam.  Cardiovascular: RRR Pulmonary/Chest: Clear to auscultation bilaterally Abdominal: Soft, non-tender, non-distended, BS + Musculoskeletal: Tender on palpation of plantar surface of foot, anterior to heel and along plantar fascia. Pain is exacerbated on dorsiflexion of toes and foot and with palpation of area. There  is no increased erythema, warmth or edema. There are no ecchymosis rash, or lesions. Foot is not tender in other areas. Extremities: No lower extremity edema bilaterally Skin: Warm, dry and intact. No rashes or erythema. Psychiatric: Normal mood and affect.      Assessment & Plan:   Please see problem based assessment and plan.

## 2014-06-17 NOTE — Assessment & Plan Note (Signed)
Patient presents with a two week history of right heel pain. Pain is located on plantar surface just anterior to heel. Pain is worst when he he first stands up in the morning, gradually improves with movement and worsens with sustained activity and/or weight bearing. At the worst, pain is a 9/10. He denies any trauma to the area, bites, cuts, or rashes. He has not done any different activity than normal and has never experienced this before. He does admit to frequent walking and weight bearing. He wears normal tennis shoes and does not go barefoot often. He denies being "flat-footed." He has tried bengay, cocoa butter, warm compresses, and epsom salt baths without relief.  Assessment: -Plantar Fasciitis  Plan: -Conservative management with: -Naproxen 500 mg BID for 3 weeks -Ice -Tape -Stretches and exercises provided to patient -Shoe inserts -If pain does not improve or if it worsens after 2-3 weeks, xray can be considered for further evaluation. Plantar fascia steroid injection can also be considered.  -Follow up in one month

## 2014-06-18 NOTE — Progress Notes (Signed)
Internal Medicine Clinic Attending  Case discussed with Dr. Richardson at the time of the visit.  We reviewed the resident's history and exam and pertinent patient test results.  I agree with the assessment, diagnosis, and plan of care documented in the resident's note. 

## 2014-06-24 ENCOUNTER — Ambulatory Visit: Payer: Medicare Other | Admitting: Neurology

## 2014-07-02 ENCOUNTER — Ambulatory Visit (INDEPENDENT_AMBULATORY_CARE_PROVIDER_SITE_OTHER): Payer: Medicare Other | Admitting: Neurology

## 2014-07-02 ENCOUNTER — Encounter: Payer: Self-pay | Admitting: Neurology

## 2014-07-02 VITALS — BP 130/80 | HR 74 | Resp 16 | Ht 72.0 in | Wt 219.0 lb

## 2014-07-02 DIAGNOSIS — G43009 Migraine without aura, not intractable, without status migrainosus: Secondary | ICD-10-CM | POA: Diagnosis not present

## 2014-07-02 DIAGNOSIS — G609 Hereditary and idiopathic neuropathy, unspecified: Secondary | ICD-10-CM

## 2014-07-02 MED ORDER — GABAPENTIN 300 MG PO CAPS: ORAL_CAPSULE | ORAL | Status: DC

## 2014-07-02 NOTE — Progress Notes (Signed)
NEUROLOGY FOLLOW UP OFFICE NOTE  John Bullock [id number]  HISTORY OF PRESENT ILLNESS: I had the pleasure of seeing John Bullock in follow-up in the neurology clinic on 07/02/2014.  The patient was last seen 2 months ago for worsening headaches, left-sided paresthesias, and neuropathy. Since his last visit, he denies any further headaches or left-sided paresthesias. His main concern was the discomfort and paresthesias in both feet. EMG/NCV consistent with chronic sensorimotor polyneuropathy. He has had good response to gabapentin, currently on 600mg  BID. He has also seen his PCP and was diagnosed with plantar fasciitis, noting improvement in foot pain with Naproxen 500mg  BID. He has also been doing the stretching exercises and uses ice packs on his feet. He notices more paresthesias in the morning hours. No daytime drowsiness on gabapentin. No falls. He walks on the sides of his feet due to the plantar fasciitis.  HPI: This is a very pleasant [name] with a history of HIV doing well on Atripla with suppressed virus and good CD4 count, and migraines since age 19 or 20. He presented with worsening migraines that had increased in severity and frequency, with associated blurred vision. Headaches are usually over the left side, with throbbing pain, photo/phonophobia, lasting up to 30-40 minutes. They can be triggered by smoke, sleep deprivation, and alcohol. He usually lies down in a dark room with a warm towel over his eyes. He was initially having headaches every 3 months, increased to up to three times a day. He presented to the ER on 12/20/13 due to severe headache with numbness on his left side. The numbness was on his left hand (small finger, ring finger and middle finger) extending up the lateral aspect of his arm and then medial aspect of upper arm felt "tingly". In addition his lateral aspect of his left foot and back of his leg felt tingly. MRI brain without contrast done was incomplete  due to increased headaches, only diffusion-weighted and T1-weighted imaging were done. MRA head did not show any significant stenosis. Echo and carotid ultrasound unremarkable. He was able to have an MRI C-spine which showed disc space narrowing with degenerative changes of the endplates with small broad-based osteophytes which slightly narrow the lateral recesses and neural foramina bilaterally at C4-5, with edema in the endplates and disc asymmetric to the left, no focal neural impingement noted. 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 He was started on gabapentin 300mg  TID and daily baby aspirin.  Diagnostic Data: MRI brain w/ and w/o contrast which did not show any acute changes, there was mild bilateral chronic microvascular change seen, no abnormal enhancement.   EMG/NCV of the left upper and lower extremities showed all sensory responses in the upper extremity including the median, ulnar, and radial are reduced in amplitude. Ulnar motor response shows slowing across the elbow. Bilateral sural and left superficial peroneal sensory responses are absent. The right superficial peroneal sensory response is reduced in amplitude.Bilateral peroneal motor responses recording at the extensor digitorum brevis are reduced, and normal when recording at the tibialis anterior. Tibial motor response is reduced on the left and borderline low on the right. Ulnar F wave is prolonged. Bilateral tibial F wave and H reflex studies shows no response. Chronic motor axon loss changes are seen affecting the tibialis anterior, medial gastrocnemius, flexor digitorum longus, and first dorsal interosseous muscles. There is no accompanied active denervation. Findings were most consistent with a chronic sensorimotor polyneuropathy, axon loss and demyelinating in  type, conforming to a length-dependent pattern, overall moderate in degree electrically.   Additional neuropathy labs, including vitamin  B1, B6, and SPEP/IFE were normal. B12 level was 333, he started taking B12 577mcg supplements.  PAST MEDICAL HISTORY: Past Medical History  Diagnosis Date  . HIV infection   . Migraine     MEDICATIONS: Current Outpatient Prescriptions on File Prior to Visit  Medication Sig Dispense Refill  . aspirin EC 81 MG tablet Take 1 tablet (81 mg total) by mouth daily. 30 tablet 0  . elvitegravir-cobicistat-emtricitabine-tenofovir (GENVOYA) 150-150-200-10 MG TABS tablet Take 1 tablet by mouth daily with breakfast. 30 tablet 11  . naproxen (NAPROSYN) 500 MG tablet Take 1 tablet (500 mg total) by mouth 2 (two) times daily with a meal. 41 tablet 0  . vitamin B-12 (CYANOCOBALAMIN) 500 MCG tablet Take 500 mcg by mouth daily.     No current facility-administered medications on file prior to visit.    ALLERGIES: Allergies  Allergen Reactions  . Nicotine Step [Nicotine] Palpitations and Rash    Smoker's patch    FAMILY HISTORY: Family History  Problem Relation Age of Onset  . Brain cancer Mother   . Testicular cancer Father   . Cancer Mother   . Cancer Father   . Stroke Mother   . Stroke Father   . Hypertension Father   . Diabetes Father     SOCIAL HISTORY: History   Social History  . Marital Status: Single    Spouse Name: N/A  . Number of Children: N/A  . Years of Education: N/A   Occupational History  . Not on file.   Social History Main Topics  . Smoking status: Current Every Day Smoker -- 0.50 packs/day for 30 years    Types: Cigarettes  . Smokeless tobacco: Never Used     Comment: trying to quit.  . Alcohol Use: 0.0 oz/week    0 Standard drinks or equivalent per week     Comment: occasional  . Drug Use: No  . Sexual Activity:    Partners: Male     Comment: given condoms   Other Topics Concern  . Not on file   Social History Narrative    REVIEW OF SYSTEMS: Constitutional: No fevers, chills, or sweats, no generalized fatigue, change in appetite Eyes: No visual  changes, double vision, eye pain Ear, nose and throat: No hearing loss, ear pain, nasal congestion, sore throat Cardiovascular: No chest pain, palpitations Respiratory:  No shortness of breath at rest or with exertion, wheezes GastrointestinaI: No nausea, vomiting, diarrhea, abdominal pain, fecal incontinence Genitourinary:  No dysuria, urinary retention or frequency Musculoskeletal:  No neck pain, back pain Integumentary: No rash, pruritus, skin lesions Neurological: as above Psychiatric: No depression, insomnia, anxiety Endocrine: No palpitations, fatigue, diaphoresis, mood swings, change in appetite, change in weight, increased thirst Hematologic/Lymphatic:  No anemia, purpura, petechiae. Allergic/Immunologic: no itchy/runny eyes, nasal congestion, recent allergic reactions, rashes  PHYSICAL EXAM: Filed Vitals:   07/02/14 0826  BP: 130/80  Pulse: 74  Resp: 16   General: No acute distress Head:  Normocephalic/atraumatic Neck: supple, no paraspinal tenderness, full range of motion Heart:  Regular rate and rhythm Lungs:  Clear to auscultation bilaterally Back: No paraspinal tenderness Skin/Extremities: No rash, no edema Neurological Exam: alert and oriented to person, place, and time. No aphasia or dysarthria. Fund of knowledge is appropriate.  Recent and remote memory are intact.  Attention and concentration are normal.    Able to name objects and repeat  phrases. Cranial nerves: Pupils equal, round, reactive to light.  Fundoscopic exam unremarkable, no papilledema. Extraocular movements intact with no nystagmus. Visual fields full. Facial sensation intact. No facial asymmetry. Tongue, uvula, palate midline.  Motor: Bulk and tone normal, muscle strength 5/5 throughout with no pronator drift.  Sensation: decreased vibration to ankles bilaterally, decreased joint position sense to right ankle, reports increased cold sensation on both feet. Intact to all modalities on both UE.  No extinction  to double simultaneous stimulation.  Deep tendon reflexes 1+ on both UE, unable to elicit on both knees due to report of pain on percussion, absent ankle jerks bilaterally, toes downgoing.  Finger to nose testing intact.  Gait narrow-based and steady, able to tandem walk adequately but reports pain in both feet.  Romberg negative.  IMPRESSION: This is a very pleasant [name] with a history of HIV with good response to antiretroviral treatment (suppressed virus, normal CD4 count), who presented with worsening migraines and left-sided paresthesias. MRI brain unremarkable. Headaches and left-sided symptoms have resolved. His main concern is the discomfort and paresthesias in both feet. EMG/NCV consistent with chronic sensorimotor polyneuropathy. Neuropathy labs unremarkable. He will increase gabapentin to 600mg  in AM, 900mg  in PM. Continue naproxen as prescribed for plantar fasciitis. He knows to call our office for any problems and will follow-up in 4 months.   Thank you for allowing me to participate in his care.  Please do not hesitate to call for any questions or concerns.  The duration of this appointment visit was 15 minutes of face-to-face time with the patient.  Greater than 50% of this time was spent in counseling, explanation of diagnosis, planning of further management, and coordination of care.   Ellouise Newer, M.D.   CC: Dr. Arcelia Jew

## 2014-07-02 NOTE — Patient Instructions (Signed)
1. Increase Gabapentin 300mg : Take 2 caps in AM, 3 caps in PM 2. Continue Naproxen 500mg  twice a day 3. Follow-up in 4 months, call for any problems

## 2014-07-07 ENCOUNTER — Telehealth: Payer: Self-pay | Admitting: Internal Medicine

## 2014-07-07 NOTE — Telephone Encounter (Signed)
Call to patient to confirm appointment for 07/08/14 at 9:15. lmtcb

## 2014-07-08 ENCOUNTER — Ambulatory Visit (INDEPENDENT_AMBULATORY_CARE_PROVIDER_SITE_OTHER): Payer: Medicare Other | Admitting: Internal Medicine

## 2014-07-08 ENCOUNTER — Ambulatory Visit: Payer: Medicare Other | Admitting: Internal Medicine

## 2014-07-08 ENCOUNTER — Encounter: Payer: Self-pay | Admitting: Internal Medicine

## 2014-07-08 VITALS — BP 120/76 | HR 57 | Temp 97.9°F | Ht 72.0 in | Wt 221.4 lb

## 2014-07-08 DIAGNOSIS — M722 Plantar fascial fibromatosis: Secondary | ICD-10-CM

## 2014-07-08 MED ORDER — NAPROXEN 500 MG PO TABS: 500.0000 mg | ORAL_TABLET | Freq: Two times a day (BID) | ORAL | Status: DC

## 2014-07-08 NOTE — Progress Notes (Signed)
   Subjective:   Patient ID: John Bullock male   DOB: [birth year] 54 y.o.   MRN: [id number]  HPI: [name] is a 54 y.o. man pmh as listed below presents for followup in regards to his plantar fasciitis.   The patient states that his swelling has remarkably decreased while taking the naproxen. He is still having some trouble at night but the naproxen with gabapentin also seems to help relieve the pain. He states that he is wearing a lot of different shoes on many different occasions and certain shoes seem to exacerbate the pain. He does not wear any insoles or support. The patient has not had any recent trauma. He states that ice also relieves the pain. He denies any trouble with ambulation, numbness or tingling, or any weakness.  Past Medical History  Diagnosis Date  . HIV infection   . Migraine    Current Outpatient Prescriptions  Medication Sig Dispense Refill  . aspirin EC 81 MG tablet Take 1 tablet (81 mg total) by mouth daily. 30 tablet 0  . elvitegravir-cobicistat-emtricitabine-tenofovir (GENVOYA) 150-150-200-10 MG TABS tablet Take 1 tablet by mouth daily with breakfast. 30 tablet 11  . gabapentin (NEURONTIN) 300 MG capsule Take 2 capsules in AM, 3 capsules in PM 150 capsule 6  . naproxen (NAPROSYN) 500 MG tablet Take 1 tablet (500 mg total) by mouth 2 (two) times daily with a meal. 41 tablet 0  . vitamin B-12 (CYANOCOBALAMIN) 500 MCG tablet Take 500 mcg by mouth daily.     No current facility-administered medications for this visit.   Family History  Problem Relation Age of Onset  . Brain cancer Mother   . Testicular cancer Father   . Cancer Mother   . Cancer Father   . Stroke Mother   . Stroke Father   . Hypertension Father   . Diabetes Father    History   Social History  . Marital Status: Single    Spouse Name: N/A  . Number of Children: N/A  . Years of Education: N/A   Social History Main Topics  . Smoking status: Current Every Day Smoker -- 0.30  packs/day for 30 years    Types: Cigarettes  . Smokeless tobacco: Never Used     Comment: trying to quit.  . Alcohol Use: 0.0 oz/week    0 Standard drinks or equivalent per week     Comment: occasional  . Drug Use: No  . Sexual Activity:    Partners: Male     Comment: given condoms   Other Topics Concern  . None   Social History Narrative   Review of Systems: Pertinent items are noted in HPI. Objective:  Physical Exam: Filed Vitals:   07/08/14 0955  BP: 120/76  Pulse: 57  Temp: 97.9 F (36.6 C)  TempSrc: Oral  Height: 6' (1.829 m)  Weight: 221 lb 6.4 oz (100.426 kg)  SpO2: 99%   General: sitting in chair, NAD  Cardiac: RRR, no rubs, murmurs or gallops Pulm: clear to auscultation bilaterally, moving normal volumes of air Ext: warm and well perfused, no pedal edema, some ttp over right heel and across arch, able to stand and walk with normal ambulation   Assessment & Plan:  Please see problem oriented charting  Pt discussed with Dr. Lynnae January

## 2014-07-08 NOTE — Patient Instructions (Signed)
General Instructions:   Thank you for bringing your medicines today. This helps [place] keep you safe from mistakes.   Progress Toward Treatment Goals:  No flowsheet data found.  Self Care Goals & Plans:  Self Care Goal 07/08/2014  Manage my medications take my medicines as prescribed; bring my medications to every visit; refill my medications on time; follow the sick day instructions if I am sick  Monitor my health keep track of my weight  Eat healthy foods eat more vegetables; eat fruit for snacks and desserts; eat foods that are low in salt  Be physically active find an activity I enjoy    No flowsheet data found.   Care Management & Community Referrals:  No flowsheet data found.     Plantar Fasciitis (Heel Spur Syndrome) with Rehab The plantar fascia is a fibrous, ligament-like, soft-tissue structure that spans the bottom of the foot. Plantar fasciitis is a condition that causes pain in the foot due to inflammation of the tissue. SYMPTOMS   Pain and tenderness on the underneath side of the foot.  Pain that worsens with standing or walking. CAUSES  Plantar fasciitis is caused by irritation and injury to the plantar fascia on the underneath side of the foot. Common mechanisms of injury include:  Direct trauma to bottom of the foot.  Damage to a small nerve that runs under the foot where the main fascia attaches to the heel bone.  Stress placed on the plantar fascia due to bone spurs. RISK INCREASES WITH:   Activities that place stress on the plantar fascia (running, jumping, pivoting, or cutting).  Poor strength and flexibility.  Improperly fitted shoes.  Tight calf muscles.  Flat feet.  Failure to warm-up properly before activity.  Obesity. PREVENTION  Warm up and stretch properly before activity.  Allow for adequate recovery between workouts.  Maintain physical fitness:  Strength, flexibility, and endurance.  Cardiovascular fitness.  Maintain a  health body weight.  Avoid stress on the plantar fascia.  Wear properly fitted shoes, including arch supports for individuals who have flat feet. PROGNOSIS  If treated properly, then the symptoms of plantar fasciitis usually resolve without surgery. However, occasionally surgery is necessary. RELATED COMPLICATIONS   Recurrent symptoms that may result in a chronic condition.  Problems of the lower back that are caused by compensating for the injury, such as limping.  Pain or weakness of the foot during push-off following surgery.  Chronic inflammation, scarring, and partial or complete fascia tear, occurring more often from repeated injections. TREATMENT  Treatment initially involves the use of ice and medication to help reduce pain and inflammation. The use of strengthening and stretching exercises may help reduce pain with activity, especially stretches of the Achilles tendon. These exercises may be performed at home or with a therapist. Your caregiver may recommend that you use heel cups of arch supports to help reduce stress on the plantar fascia. Occasionally, corticosteroid injections are given to reduce inflammation. If symptoms persist for greater than 6 months despite non-surgical (conservative), then surgery may be recommended.  MEDICATION   If pain medication is necessary, then nonsteroidal anti-inflammatory medications, such as aspirin and ibuprofen, or other minor pain relievers, such as acetaminophen, are often recommended.  Do not take pain medication within 7 days before surgery.  Prescription pain relievers may be given if deemed necessary by your caregiver. Use only as directed and only as much as you need.  Corticosteroid injections may be given by your caregiver. These injections should  be reserved for the most serious cases, because they may only be given a certain number of times. HEAT AND COLD  Cold treatment (icing) relieves pain and reduces inflammation. Cold  treatment should be applied for 10 to 15 minutes every 2 to 3 hours for inflammation and pain and immediately after any activity that aggravates your symptoms. Use ice packs or massage the area with a piece of ice (ice massage).  Heat treatment may be used prior to performing the stretching and strengthening activities prescribed by your caregiver, physical therapist, or athletic trainer. Use a heat pack or soak the injury in warm water. SEEK IMMEDIATE MEDICAL CARE IF:  Treatment seems to offer no benefit, or the condition worsens.  Any medications produce adverse side effects. EXERCISES RANGE OF MOTION (ROM) AND STRETCHING EXERCISES - Plantar Fasciitis (Heel Spur Syndrome) These exercises may help you when beginning to rehabilitate your injury. Your symptoms may resolve with or without further involvement from your physician, physical therapist or athletic trainer. While completing these exercises, remember:   Restoring tissue flexibility helps normal motion to return to the joints. This allows healthier, less painful movement and activity.  An effective stretch should be held for at least 30 seconds.  A stretch should never be painful. You should only feel a gentle lengthening or release in the stretched tissue. RANGE OF MOTION - Toe Extension, Flexion  Sit with your right / left leg crossed over your opposite knee.  Grasp your toes and gently pull them back toward the top of your foot. You should feel a stretch on the bottom of your toes and/or foot.  Hold this stretch for __________ seconds.  Now, gently pull your toes toward the bottom of your foot. You should feel a stretch on the top of your toes and or foot.  Hold this stretch for __________ seconds. Repeat __________ times. Complete this stretch __________ times per day.  RANGE OF MOTION - Ankle Dorsiflexion, Active Assisted  Remove shoes and sit on a chair that is preferably not on a carpeted surface.  Place right / left  foot under knee. Extend your opposite leg for support.  Keeping your heel down, slide your right / left foot back toward the chair until you feel a stretch at your ankle or calf. If you do not feel a stretch, slide your bottom forward to the edge of the chair, while still keeping your heel down.  Hold this stretch for __________ seconds. Repeat __________ times. Complete this stretch __________ times per day.  STRETCH - Gastroc, Standing  Place hands on wall.  Extend right / left leg, keeping the front knee somewhat bent.  Slightly point your toes inward on your back foot.  Keeping your right / left heel on the floor and your knee straight, shift your weight toward the wall, not allowing your back to arch.  You should feel a gentle stretch in the right / left calf. Hold this position for __________ seconds. Repeat __________ times. Complete this stretch __________ times per day. STRETCH - Soleus, Standing  Place hands on wall.  Extend right / left leg, keeping the other knee somewhat bent.  Slightly point your toes inward on your back foot.  Keep your right / left heel on the floor, bend your back knee, and slightly shift your weight over the back leg so that you feel a gentle stretch deep in your back calf.  Hold this position for __________ seconds. Repeat __________ times. Complete this stretch __________  times per day. STRETCH - Gastrocsoleus, Standing  Note: This exercise can place a lot of stress on your foot and ankle. Please complete this exercise only if specifically instructed by your caregiver.   Place the ball of your right / left foot on a step, keeping your other foot firmly on the same step.  Hold on to the wall or a rail for balance.  Slowly lift your other foot, allowing your body weight to press your heel down over the edge of the step.  You should feel a stretch in your right / left calf.  Hold this position for __________ seconds.  Repeat this exercise  with a slight bend in your right / left knee. Repeat __________ times. Complete this stretch __________ times per day.  STRENGTHENING EXERCISES - Plantar Fasciitis (Heel Spur Syndrome)  These exercises may help you when beginning to rehabilitate your injury. They may resolve your symptoms with or without further involvement from your physician, physical therapist or athletic trainer. While completing these exercises, remember:   Muscles can gain both the endurance and the strength needed for everyday activities through controlled exercises.  Complete these exercises as instructed by your physician, physical therapist or athletic trainer. Progress the resistance and repetitions only as guided. STRENGTH - Towel Curls  Sit in a chair positioned on a non-carpeted surface.  Place your foot on a towel, keeping your heel on the floor.  Pull the towel toward your heel by only curling your toes. Keep your heel on the floor.  If instructed by your physician, physical therapist or athletic trainer, add ____________________ at the end of the towel. Repeat __________ times. Complete this exercise __________ times per day. STRENGTH - Ankle Inversion  Secure one end of a rubber exercise band/tubing to a fixed object (table, pole). Loop the other end around your foot just before your toes.  Place your fists between your knees. This will focus your strengthening at your ankle.  Slowly, pull your big toe up and in, making sure the band/tubing is positioned to resist the entire motion.  Hold this position for __________ seconds.  Have your muscles resist the band/tubing as it slowly pulls your foot back to the starting position. Repeat __________ times. Complete this exercises __________ times per day.  Document Released: 05/02/2005 Document Revised: 07/25/2011 Document Reviewed: 08/14/2008 [facility] Patient Information 2015 [place], [place]. This information is not intended to replace advice given to you  by your health care provider. Make sure you discuss any questions you have with your health care provider.

## 2014-07-08 NOTE — Assessment & Plan Note (Signed)
Patient states pain is beginning to subside and improved since starting naproxen and continue conservative management with absence salt baths, ice, and rest. -Referral to podiatry for assessment for orthotics -Continue with NSAID use -Continue with stretches and exercises and other somatic management -Patient decided against imaging studies today as will likely be performed at podiatry visit

## 2014-07-09 NOTE — Progress Notes (Signed)
Internal Medicine Clinic Attending  Case discussed with Dr. Sadek soon after the resident saw the patient.  We reviewed the resident's history and exam and pertinent patient test results.  I agree with the assessment, diagnosis, and plan of care documented in the resident's note. 

## 2014-07-23 ENCOUNTER — Ambulatory Visit: Payer: Medicare Other | Admitting: Podiatry

## 2014-08-04 ENCOUNTER — Telehealth: Payer: Self-pay | Admitting: Internal Medicine

## 2014-08-04 NOTE — Telephone Encounter (Signed)
Call to patient to confirm appointment for 08/05/14 at 1:45 lmtcb

## 2014-08-05 ENCOUNTER — Ambulatory Visit (INDEPENDENT_AMBULATORY_CARE_PROVIDER_SITE_OTHER): Payer: Medicare Other | Admitting: Internal Medicine

## 2014-08-05 DIAGNOSIS — J019 Acute sinusitis, unspecified: Secondary | ICD-10-CM

## 2014-08-05 DIAGNOSIS — M722 Plantar fascial fibromatosis: Secondary | ICD-10-CM

## 2014-08-05 DIAGNOSIS — Z21 Asymptomatic human immunodeficiency virus [HIV] infection status: Secondary | ICD-10-CM | POA: Diagnosis not present

## 2014-08-05 DIAGNOSIS — B20 Human immunodeficiency virus [HIV] disease: Secondary | ICD-10-CM

## 2014-08-05 DIAGNOSIS — F17210 Nicotine dependence, cigarettes, uncomplicated: Secondary | ICD-10-CM | POA: Diagnosis not present

## 2014-08-05 DIAGNOSIS — J0140 Acute pansinusitis, unspecified: Secondary | ICD-10-CM

## 2014-08-05 DIAGNOSIS — Z720 Tobacco use: Secondary | ICD-10-CM

## 2014-08-05 NOTE — Patient Instructions (Signed)
It was a pleasure seeing you today, John Bullock.   1. Sinusitis - Try Mucinex to help with your congestion - Drink lots of fluids! - If your symptoms do not resolve in the next 5-7 days then please call the clinic to make an appointment  General Instructions:   Please bring your medicines with you each time you come to clinic.  Medicines may include prescription medications, over-the-counter medications, herbal remedies, eye drops, vitamins, or other pills.   Progress Toward Treatment Goals:  No flowsheet data found.  Self Care Goals & Plans:  Self Care Goal 07/08/2014  Manage my medications take my medicines as prescribed; bring my medications to every visit; refill my medications on time; follow the sick day instructions if I am sick  Monitor my health keep track of my weight  Eat healthy foods eat more vegetables; eat fruit for snacks and desserts; eat foods that are low in salt  Be physically active find an activity I enjoy    No flowsheet data found.   Care Management & Community Referrals:  No flowsheet data found.

## 2014-08-06 ENCOUNTER — Encounter: Payer: Self-pay | Admitting: Internal Medicine

## 2014-08-06 DIAGNOSIS — J019 Acute sinusitis, unspecified: Secondary | ICD-10-CM | POA: Insufficient documentation

## 2014-08-06 NOTE — Assessment & Plan Note (Signed)
Patient still smoking 3-4 cigarettes daily. Encouraged patient to cut back even more. I commended him for his success so far.  - Discuss again at next visit - Continue to encourage smoking cessation

## 2014-08-06 NOTE — Assessment & Plan Note (Signed)
Patient reports his symptoms have completely resolved with NSAIDs and conservative management.  - Continue to monitor - Continue conservative management if symptoms reoccur

## 2014-08-06 NOTE — Assessment & Plan Note (Signed)
Symptoms for last 4-5 days. Lungs clear on exam, some tenderness to palpation of sinuses and pharynx erythematous. Likely acute sinusitis. Would be concerned for more serious opportunistic infection with history of HIV (e.g. PCP), but he is well controlled.  - Will not prescribe antibiotics at this time as likely is viral vs. Allergies - Patient instructed to call clinic if his symptoms do not resolve in next 5-7 days. Antibiotics can be considered at that time if still symptomatic.

## 2014-08-06 NOTE — Assessment & Plan Note (Signed)
Well controlled. He has a follow up appointment with ID in April.

## 2014-08-06 NOTE — Progress Notes (Signed)
   Subjective:    Patient ID: John Bullock, male    DOB: November 17, 1960, 54 y.o.   MRN: [id number]  HPI John Bullock is a 54yo man with PMHx of well controlled HIV, migraines, and tobacco abuse who presents today for the following:  1. Upper Respiratory Symptoms: Patient reports for the last 4-5 days he has had a cough productive of green/yellow sputum. He also notes congestion, sinus pressure, wheezing, dyspnea, and pleuritic chest pain. He denies any fever, chills, sore throat. He denies any sick contacts. He received his influenza this year.   2. Plantar Fasciitis: Patient reports he is no longer having pain in his foot. He states NSAIDs and conservative management with hot/cold compresses helped his pain.   3. HIV: Last CD4 490 and VL < 20 on 04/16/14. He follows in ID clinic and saw Dr. Linus Salmons in December. He was switched to [facility] in December.   4. Tobacco abuse: Patient reports he is still smoking 3-4 cigarettes daily. He has cut down from 6-8 daily. I commended him for cutting back. He states stress/anxiety is what is keeping him for quitting completely.    Review of Systems General: Denies night sweats, changes in weight, changes in appetite HEENT: Denies headaches, ear pain, changes in vision CV: Denies palpitations, orthopnea Pulm: See above GI: Denies abdominal pain, nausea, vomiting, diarrhea, constipation, melena, hematochezia GU: Denies dysuria, hematuria, frequency Msk: Denies muscle cramps, joint pains Neuro: Denies weakness, numbness, tingling Skin: Denies rashes, bruising    Objective:   Physical Exam General: sitting up in chair, NAD HEENT: [place]/AT, EOMI, mild tenderness to palpation of maxillary sinuses, pharynx erythematous, mucus membranes moist CV: RRR, no m/g/r Pulm: CTA bilaterally, breaths non-labored, no wheezing Abd: BS+, soft, non-tender, non-distended Ext: warm, no edema, moves all Neuro: alert and oriented x 3, no focal deficits     Assessment & Plan:

## 2014-08-07 NOTE — Progress Notes (Signed)
Internal Medicine Clinic Attending  Case discussed with Dr. Rivet at the time of the visit.  We reviewed the resident's history and exam and pertinent patient test results.  I agree with the assessment, diagnosis, and plan of care documented in the resident's note.  

## 2014-08-08 ENCOUNTER — Other Ambulatory Visit: Payer: Self-pay | Admitting: Internal Medicine

## 2014-08-25 ENCOUNTER — Encounter: Payer: Self-pay | Admitting: *Deleted

## 2014-08-25 ENCOUNTER — Other Ambulatory Visit: Payer: Medicare Other

## 2014-08-26 ENCOUNTER — Other Ambulatory Visit: Payer: Medicare Other

## 2014-08-26 DIAGNOSIS — B20 Human immunodeficiency virus [HIV] disease: Secondary | ICD-10-CM

## 2014-08-26 LAB — CBC WITH DIFFERENTIAL/PLATELET
Basophils Absolute: 0 10*3/uL (ref 0.0–0.1)
Basophils Relative: 1 % (ref 0–1)
Eosinophils Absolute: 0.1 10*3/uL (ref 0.0–0.7)
Eosinophils Relative: 2 % (ref 0–5)
HCT: 40.1 % (ref 39.0–52.0)
Hemoglobin: 13.3 g/dL (ref 13.0–17.0)
Lymphocytes Relative: 42 % (ref 12–46)
Lymphs Abs: 1.8 10*3/uL (ref 0.7–4.0)
MCH: 29 pg (ref 26.0–34.0)
MCHC: 33.2 g/dL (ref 30.0–36.0)
MCV: 87.4 fL (ref 78.0–100.0)
MPV: 10.2 fL (ref 8.6–12.4)
Monocytes Absolute: 0.8 10*3/uL (ref 0.1–1.0)
Monocytes Relative: 19 % — ABNORMAL HIGH (ref 3–12)
Neutro Abs: 1.5 10*3/uL — ABNORMAL LOW (ref 1.7–7.7)
Neutrophils Relative %: 36 % — ABNORMAL LOW (ref 43–77)
Platelets: 193 10*3/uL (ref 150–400)
RBC: 4.59 MIL/uL (ref 4.22–5.81)
RDW: 14.4 % (ref 11.5–15.5)
WBC: 4.2 10*3/uL (ref 4.0–10.5)

## 2014-08-26 LAB — COMPLETE METABOLIC PANEL WITH GFR
ALT: 9 U/L (ref 0–53)
AST: 20 U/L (ref 0–37)
Albumin: 4.2 g/dL (ref 3.5–5.2)
Alkaline Phosphatase: 58 U/L (ref 39–117)
BUN: 23 mg/dL (ref 6–23)
CO2: 24 mEq/L (ref 19–32)
Calcium: 8.6 mg/dL (ref 8.4–10.5)
Chloride: 107 mEq/L (ref 96–112)
Creat: 1.25 mg/dL (ref 0.50–1.35)
GFR, Est African American: 76 mL/min
GFR, Est Non African American: 66 mL/min
Glucose, Bld: 87 mg/dL (ref 70–99)
Potassium: 4.2 mEq/L (ref 3.5–5.3)
Sodium: 139 mEq/L (ref 135–145)
Total Bilirubin: 0.4 mg/dL (ref 0.2–1.2)
Total Protein: 6.8 g/dL (ref 6.0–8.3)

## 2014-08-27 LAB — T-HELPER CELL (CD4) - (RCID CLINIC ONLY)
CD4 % Helper T Cell: 32 % — ABNORMAL LOW (ref 33–55)
CD4 T Cell Abs: 610 /uL (ref 400–2700)

## 2014-08-27 LAB — HIV-1 RNA QUANT-NO REFLEX-BLD
HIV 1 RNA Quant: <20 copies/mL (ref <20)
HIV-1 RNA Quant, Log: <1.3 {Log} (ref <1.30)

## 2014-09-11 ENCOUNTER — Encounter: Payer: Self-pay | Admitting: Internal Medicine

## 2014-09-11 ENCOUNTER — Ambulatory Visit (INDEPENDENT_AMBULATORY_CARE_PROVIDER_SITE_OTHER): Payer: Medicare Other | Admitting: Internal Medicine

## 2014-09-11 VITALS — BP 146/106 | HR 65 | Temp 98.2°F | Ht 72.0 in | Wt 202.0 lb

## 2014-09-11 DIAGNOSIS — I10 Essential (primary) hypertension: Secondary | ICD-10-CM | POA: Diagnosis not present

## 2014-09-11 DIAGNOSIS — B20 Human immunodeficiency virus [HIV] disease: Secondary | ICD-10-CM | POA: Diagnosis present

## 2014-09-11 HISTORY — DX: Essential (primary) hypertension: I10

## 2014-09-11 NOTE — Assessment & Plan Note (Signed)
Doing well. We'll continue with Genvoya.  Has condoms.

## 2014-09-11 NOTE — Progress Notes (Signed)
   Subjective:    Patient ID: John Bullock, male    DOB: Mar 07, 1961, 54 y.o.   MRN: [id number]  HPI Here for B20 follow up.  For his HIV, his last labs are good with CD4 of 610 and continued undetectable vl.  No new issues.  Continues on Genvoya.  Had colonoscopy.  Uses condoms always.          Review of Systems  Constitutional: Negative for fatigue.  HENT: Negative for trouble swallowing.   Gastrointestinal: Negative for nausea and diarrhea.  Genitourinary: Negative for discharge, penile swelling and penile pain.  Skin: Negative for rash.  Neurological: Negative for dizziness.       Objective:   Physical Exam  Constitutional: He appears well-developed and well-nourished.  HENT:  Mouth/Throat: No oropharyngeal exudate.  Eyes: No scleral icterus.  Cardiovascular: Normal rate, regular rhythm and normal heart sounds.   No murmur heard. Pulmonary/Chest: Effort normal and breath sounds normal. No respiratory distress.  Lymphadenopathy:    He has no cervical adenopathy.  Skin: No rash noted.          Assessment & Plan:

## 2014-09-11 NOTE — Assessment & Plan Note (Signed)
Elevated.  Will need fu with pcp

## 2014-09-24 NOTE — Addendum Note (Signed)
Addended by: Hulan Fray on: 09/24/2014 05:56 PM   Modules accepted: Orders

## 2014-11-28 ENCOUNTER — Other Ambulatory Visit: Payer: Self-pay | Admitting: Internal Medicine

## 2014-12-30 ENCOUNTER — Other Ambulatory Visit: Payer: Medicare Other

## 2015-01-01 ENCOUNTER — Other Ambulatory Visit: Payer: Medicare Other

## 2015-01-05 ENCOUNTER — Other Ambulatory Visit: Payer: Medicare Other

## 2015-01-05 DIAGNOSIS — B20 Human immunodeficiency virus [HIV] disease: Secondary | ICD-10-CM

## 2015-01-06 LAB — T-HELPER CELL (CD4) - (RCID CLINIC ONLY)
CD4 % Helper T Cell: 33 % (ref 33–55)
CD4 T Cell Abs: 710 /uL (ref 400–2700)

## 2015-01-06 LAB — HIV-1 RNA QUANT-NO REFLEX-BLD
HIV 1 RNA Quant: <20 copies/mL (ref <20)
HIV-1 RNA Quant, Log: <1.3 {Log} (ref <1.30)

## 2015-01-13 ENCOUNTER — Encounter: Payer: Self-pay | Admitting: Internal Medicine

## 2015-01-13 ENCOUNTER — Ambulatory Visit (INDEPENDENT_AMBULATORY_CARE_PROVIDER_SITE_OTHER): Payer: Medicare Other | Admitting: Internal Medicine

## 2015-01-13 VITALS — Temp 97.8°F | Ht 72.0 in | Wt 222.0 lb

## 2015-01-13 DIAGNOSIS — Z113 Encounter for screening for infections with a predominantly sexual mode of transmission: Secondary | ICD-10-CM | POA: Insufficient documentation

## 2015-01-13 DIAGNOSIS — Z720 Tobacco use: Secondary | ICD-10-CM

## 2015-01-13 DIAGNOSIS — B20 Human immunodeficiency virus [HIV] disease: Secondary | ICD-10-CM | POA: Diagnosis not present

## 2015-01-13 DIAGNOSIS — G629 Polyneuropathy, unspecified: Secondary | ICD-10-CM | POA: Diagnosis not present

## 2015-01-13 DIAGNOSIS — Z23 Encounter for immunization: Secondary | ICD-10-CM

## 2015-01-13 NOTE — Progress Notes (Signed)
   Subjective:    Patient ID: John Bullock, male    DOB: 1960/11/18, 54 y.o.   MRN: [id number]  HPI Here for B20 follow up.  For his HIV, his last labs are good with CD4 of 710 and continued undetectable vl.  No new issues.  Continues on Genvoya.  Uses condoms always.  Feels like peripheral neuropathy is better and only uses gabapentin at night.  Does not take naprosyn anymore.  Quiting smoking and down to just 3 cigarettes per week. BP good today.          Review of Systems  Constitutional: Negative for fatigue.  HENT: Negative for trouble swallowing.   Gastrointestinal: Negative for nausea and diarrhea.  Genitourinary: Negative for discharge, penile swelling and penile pain.  Skin: Negative for rash.  Neurological: Negative for dizziness.       Objective:   Physical Exam  Constitutional: He appears well-developed and well-nourished.  HENT:  Mouth/Throat: No oropharyngeal exudate.  Eyes: No scleral icterus.  Cardiovascular: Normal rate, regular rhythm and normal heart sounds.   No murmur heard. Pulmonary/Chest: Effort normal and breath sounds normal. No respiratory distress.  Lymphadenopathy:    He has no cervical adenopathy.  Skin: No rash noted.          Assessment & Plan:

## 2015-01-13 NOTE — Assessment & Plan Note (Signed)
Encouraged continued cessation efforts.  

## 2015-01-13 NOTE — Assessment & Plan Note (Addendum)
Doing great.  Will continue with same.  RTC 4 months.  Will defer lipid panel to PCP.  Flu shot today

## 2015-01-13 NOTE — Assessment & Plan Note (Addendum)
Seems to be ok now and may not need to continue the gabapentin.  He will wean as needed.

## 2015-01-21 ENCOUNTER — Other Ambulatory Visit: Payer: Self-pay | Admitting: Internal Medicine

## 2015-01-21 ENCOUNTER — Other Ambulatory Visit: Payer: Self-pay | Admitting: Neurology

## 2015-04-08 ENCOUNTER — Other Ambulatory Visit: Payer: Self-pay | Admitting: Internal Medicine

## 2015-05-08 ENCOUNTER — Emergency Department (HOSPITAL_COMMUNITY)
Admission: EM | Admit: 2015-05-08 | Discharge: 2015-05-08 | Disposition: A | Discharge status: Home / Self Care | Payer: Medicare Other | Attending: Emergency Medicine | Admitting: Emergency Medicine

## 2015-05-08 ENCOUNTER — Encounter (HOSPITAL_COMMUNITY): Payer: Self-pay | Admitting: Emergency Medicine

## 2015-05-08 DIAGNOSIS — B20 Human immunodeficiency virus [HIV] disease: Secondary | ICD-10-CM | POA: Diagnosis not present

## 2015-05-08 DIAGNOSIS — Z79899 Other long term (current) drug therapy: Secondary | ICD-10-CM | POA: Diagnosis not present

## 2015-05-08 DIAGNOSIS — F17210 Nicotine dependence, cigarettes, uncomplicated: Secondary | ICD-10-CM | POA: Diagnosis not present

## 2015-05-08 DIAGNOSIS — I10 Essential (primary) hypertension: Secondary | ICD-10-CM | POA: Insufficient documentation

## 2015-05-08 DIAGNOSIS — K529 Noninfective gastroenteritis and colitis, unspecified: Secondary | ICD-10-CM | POA: Diagnosis not present

## 2015-05-08 DIAGNOSIS — G43909 Migraine, unspecified, not intractable, without status migrainosus: Secondary | ICD-10-CM | POA: Diagnosis not present

## 2015-05-08 DIAGNOSIS — R197 Diarrhea, unspecified: Secondary | ICD-10-CM | POA: Diagnosis present

## 2015-05-08 LAB — COMPREHENSIVE METABOLIC PANEL
ALT: 13 U/L — ABNORMAL LOW (ref 17–63)
AST: 19 U/L (ref 15–41)
Albumin: 4.2 g/dL (ref 3.5–5.0)
Alkaline Phosphatase: 56 U/L (ref 38–126)
Anion gap: 10 (ref 5–15)
BUN: 15 mg/dL (ref 6–20)
CO2: 21 mmol/L — ABNORMAL LOW (ref 22–32)
Calcium: 9.1 mg/dL (ref 8.9–10.3)
Chloride: 109 mmol/L (ref 101–111)
Creatinine, Ser: 1.47 mg/dL — ABNORMAL HIGH (ref 0.61–1.24)
GFR calc Af Amer: >60 mL/min (ref >60)
GFR calc non Af Amer: 53 mL/min — ABNORMAL LOW (ref >60)
Glucose, Bld: 132 mg/dL — ABNORMAL HIGH (ref 65–99)
Potassium: 3.8 mmol/L (ref 3.5–5.1)
Sodium: 140 mmol/L (ref 135–145)
Total Bilirubin: 1 mg/dL (ref 0.3–1.2)
Total Protein: 7.3 g/dL (ref 6.5–8.1)

## 2015-05-08 LAB — CBC WITH DIFFERENTIAL/PLATELET
Basophils Absolute: 0 10*3/uL (ref 0.0–0.1)
Basophils Relative: 0 %
Eosinophils Absolute: 0.1 10*3/uL (ref 0.0–0.7)
Eosinophils Relative: 3 %
HCT: 45.6 % (ref 39.0–52.0)
Hemoglobin: 14.9 g/dL (ref 13.0–17.0)
Lymphocytes Relative: 19 %
Lymphs Abs: 0.9 10*3/uL (ref 0.7–4.0)
MCH: 29.6 pg (ref 26.0–34.0)
MCHC: 32.7 g/dL (ref 30.0–36.0)
MCV: 90.5 fL (ref 78.0–100.0)
Monocytes Absolute: 0.7 10*3/uL (ref 0.1–1.0)
Monocytes Relative: 14 %
Neutro Abs: 3.1 10*3/uL (ref 1.7–7.7)
Neutrophils Relative %: 64 %
Platelets: 177 10*3/uL (ref 150–400)
RBC: 5.04 MIL/uL (ref 4.22–5.81)
RDW: 13.6 % (ref 11.5–15.5)
WBC: 4.8 10*3/uL (ref 4.0–10.5)

## 2015-05-08 LAB — LIPASE, BLOOD: Lipase: 21 U/L (ref 11–51)

## 2015-05-08 MED ORDER — SODIUM CHLORIDE 0.9 % IV BOLUS (SEPSIS)
1000.0000 mL | Freq: Once | INTRAVENOUS | Status: AC
  Administered 2015-05-08: 1000 mL via INTRAVENOUS

## 2015-05-08 MED ORDER — ONDANSETRON HCL 4 MG/2ML IJ SOLN
4.0000 mg | Freq: Once | INTRAMUSCULAR | Status: AC
  Administered 2015-05-08: 4 mg via INTRAVENOUS
  Filled 2015-05-08: qty 2

## 2015-05-08 MED ORDER — ONDANSETRON 4 MG PO TBDP
4.0000 mg | ORAL_TABLET | Freq: Once | ORAL | Status: AC
  Administered 2015-05-08: 4 mg via ORAL
  Filled 2015-05-08: qty 1

## 2015-05-08 MED ORDER — ONDANSETRON HCL 4 MG PO TABS: 4.0000 mg | ORAL_TABLET | Freq: Four times a day (QID) | ORAL | Status: DC

## 2015-05-08 MED ORDER — ACETAMINOPHEN 325 MG PO TABS
650.0000 mg | ORAL_TABLET | Freq: Once | ORAL | Status: AC
  Administered 2015-05-08: 650 mg via ORAL
  Filled 2015-05-08: qty 2

## 2015-05-08 NOTE — ED Notes (Signed)
Pt requesting something for nausea before he is discharged. PA Hedges advised patient may get 4mg  zofran ODT.

## 2015-05-08 NOTE — ED Notes (Signed)
Pt now reporting he had a headache and would like something for it prior to leaving. Dr. Laneta Simmers made aware and advised patient can get some tylenol.

## 2015-05-08 NOTE — ED Notes (Signed)
Pt reports nausea/vomiting/diarrhea x2 days. Reports 7-8 episodes of vomiting/diarrhea in the past 24 hours. Pt alert/oriented, nad.

## 2015-05-08 NOTE — ED Notes (Signed)
PA Hedges at bedside  

## 2015-05-08 NOTE — ED Provider Notes (Signed)
CSN: [id number]     Arrival date & time 05/08/15  [id number] History   First MD Initiated Contact with Patient 05/08/15 [phone]     Chief Complaint  Patient presents with  . Vomiting  . Diarrhea    HPI   54 year old male with history of HIV ( undetectable) presents today with onset of nausea vomiting and diarrhea. Patient reports that over the last 2 days he's had frequent episodes of nonbloody non-projectile vomiting and diarrhea with associated abdominal cramping. He reports approximately 8 episodes of each within the last 24 hours. He notes attempting by mouth intake last night reporting he attempted to eat pizza that did not sit well on his stomach and again caused vomiting. Patient reports he's been able to tolerate small amounts of clear liquids. Patient denies any fever, reports subjective chills, denies significant abdominal pain, exposure to abnormal food or drink, lower extremity swelling or edema, or any close sick contacts with similar symptoms. No history of the same. Patient reports prior to current symptoms he was otherwise feeling well, at the time of evaluation reports that he feels nauseous, has minimal abdominal discomfort, and feels "weak". Patient denies any recent antibiotic exposure, exposure to anyone with significant illness.  Past Medical History  Diagnosis Date  . HIV infection ([place])   . Migraine   . HTN (hypertension) 09/11/2014   Past Surgical History  Procedure Laterality Date  . Appendectomy  1990   Family History  Problem Relation Age of Onset  . Brain cancer Mother   . Testicular cancer Father   . Cancer Mother   . Cancer Father   . Stroke Mother   . Stroke Father   . Hypertension Father   . Diabetes Father    Social History  Substance Use Topics  . Smoking status: Current Every Day Smoker -- 0.30 packs/day for 30 years    Types: Cigarettes  . Smokeless tobacco: Never Used     Comment: trying to quit.  . Alcohol Use: 0.0 oz/week    0 Standard drinks or  equivalent per week     Comment: occasional    Review of Systems  All other systems reviewed and are negative.   Allergies  Nicotine step  Home Medications   Prior to Admission medications   Medication Sig Start Date End Date Taking? Authorizing Provider  GENVOYA 150-150-200-10 MG TABS tablet TAKE 1 TABLET BY MOUTH DAILY WITH BREAKFAST. 04/13/15  Yes Thayer Headings, MD  naproxen (NAPROSYN) 500 MG tablet TAKE 1 TABLET (500 MG TOTAL) BY MOUTH TWO TIMES DAILY WITH A MEAL. 01/22/15  Yes Carly J Rivet, MD  gabapentin (NEURONTIN) 300 MG capsule TAKE TWO   CAPSULES BY MOUTH   DAILY IN THE MORNING , THREE   CAPSULES DAILY IN THE EVENING 01/21/15   Cameron Sprang, MD  ondansetron (ZOFRAN) 4 MG tablet Take 1 tablet (4 mg total) by mouth every 6 (six) hours. 05/08/15   Tadd Holtmeyer, PA-C   BP 107/59 mmHg  Pulse 77  Temp(Src) 98 F (36.7 C) (Oral)  Resp 16  SpO2 97%   Physical Exam  Constitutional: He is oriented to person, place, and time. He appears well-developed and well-nourished.  HENT:  Head: Normocephalic and atraumatic.  Mouth/Throat: Uvula is midline. Mucous membranes are dry.  Eyes: Conjunctivae are normal. Pupils are equal, round, and reactive to light. Right eye exhibits no discharge. Left eye exhibits no discharge. No scleral icterus.  Neck: Normal range of motion. No JVD present.  No tracheal deviation present.  Cardiovascular: Normal rate, regular rhythm, normal heart sounds and intact distal pulses.  Exam reveals no gallop and no friction rub.   No murmur heard. Pulmonary/Chest: Effort normal and breath sounds normal. No stridor. No respiratory distress. He has no wheezes. He has no rales. He exhibits no tenderness.  Abdominal: Soft. Bowel sounds are increased. There is no tenderness.  Musculoskeletal: Normal range of motion. He exhibits no edema or tenderness.  Neurological: He is alert and oriented to person, place, and time. Coordination normal.  Skin: Skin is warm and  dry. No rash noted. No erythema. No pallor.  Psychiatric: He has a normal mood and affect. His behavior is normal. Judgment and thought content normal.  Nursing note and vitals reviewed.   ED Course  Procedures (including critical care time) Labs Review Labs Reviewed  COMPREHENSIVE METABOLIC PANEL - Abnormal; Notable for the following:    CO2 21 (*)    Glucose, Bld 132 (*)    Creatinine, Ser 1.47 (*)    ALT 13 (*)    GFR calc non Af Amer 53 (*)    All other components within normal limits  CBC WITH DIFFERENTIAL/PLATELET  LIPASE, BLOOD    Imaging Review No results found. I have personally reviewed and evaluated these images and lab results as part of my medical decision-making.   EKG Interpretation None      MDM   Final diagnoses:  Gastroenteritis    Labs: CBC, CMP, lipase- creatinine 1.47  Imaging:  Consults:  Therapeutics: Acetaminophen, Zofran, normal saline  Discharge Meds: Zofran  Assessment/Plan: She is presentation most consistent with gastroenteritis. Patient is afebrile, with reassuring vital signs. He has benign abdominal exam, tolerating by mouth here without difficulty. Patient will be discharged home with instructions to maintain adequate oral hydration, use Zofran as needed for nausea, return to emergency room if any significant changes occur. Patient is instructed follow-up with his primary care provider next week if symptoms persist. Patient verbalizes understanding and agreement for today's plan, and had no further questions or concerns at the time of discharge.         Okey Regal, PA-C 05/08/15 1616  Leo Grosser, MD 05/09/15 [phone]

## 2015-05-08 NOTE — Discharge Instructions (Signed)
Please read attached information. If you experience any new or worsening signs or symptoms please return to the emergency room for evaluation. Please follow-up with your primary care provider or specialist as discussed. Please use medication prescribed only as directed and discontinue taking if you have any concerning signs or symptoms.   °

## 2015-05-12 ENCOUNTER — Encounter (HOSPITAL_COMMUNITY): Payer: Self-pay | Admitting: Emergency Medicine

## 2015-05-12 ENCOUNTER — Emergency Department (INDEPENDENT_AMBULATORY_CARE_PROVIDER_SITE_OTHER)
Admission: EM | Admit: 2015-05-12 | Discharge: 2015-05-12 | Disposition: A | Discharge status: Home / Self Care | Payer: Medicare Other | Source: Home / Self Care | Attending: Emergency Medicine | Admitting: Emergency Medicine

## 2015-05-12 DIAGNOSIS — G43009 Migraine without aura, not intractable, without status migrainosus: Secondary | ICD-10-CM

## 2015-05-12 DIAGNOSIS — Z720 Tobacco use: Secondary | ICD-10-CM | POA: Diagnosis not present

## 2015-05-12 DIAGNOSIS — J9801 Acute bronchospasm: Secondary | ICD-10-CM

## 2015-05-12 MED ORDER — DEXAMETHASONE SODIUM PHOSPHATE 10 MG/ML IJ SOLN
10.0000 mg | Freq: Once | INTRAMUSCULAR | Status: AC
  Administered 2015-05-12: 10 mg via INTRAMUSCULAR

## 2015-05-12 MED ORDER — KETOROLAC TROMETHAMINE 60 MG/2ML IM SOLN
60.0000 mg | Freq: Once | INTRAMUSCULAR | Status: AC
  Administered 2015-05-12: 60 mg via INTRAMUSCULAR

## 2015-05-12 MED ORDER — DEXAMETHASONE SODIUM PHOSPHATE 10 MG/ML IJ SOLN
INTRAMUSCULAR | Status: AC
  Filled 2015-05-12: qty 1

## 2015-05-12 MED ORDER — PREDNISONE 20 MG PO TABS: ORAL_TABLET | ORAL | Status: DC

## 2015-05-12 MED ORDER — ALBUTEROL SULFATE HFA 108 (90 BASE) MCG/ACT IN AERS: 2.0000 | INHALATION_SPRAY | RESPIRATORY_TRACT | Status: DC | PRN

## 2015-05-12 MED ORDER — DIPHENHYDRAMINE HCL 50 MG/ML IJ SOLN
50.0000 mg | Freq: Once | INTRAMUSCULAR | Status: AC
  Administered 2015-05-12: 50 mg via INTRAMUSCULAR

## 2015-05-12 MED ORDER — DIPHENHYDRAMINE HCL 50 MG/ML IJ SOLN
INTRAMUSCULAR | Status: AC
  Filled 2015-05-12: qty 1

## 2015-05-12 MED ORDER — KETOROLAC TROMETHAMINE 60 MG/2ML IM SOLN
INTRAMUSCULAR | Status: AC
  Filled 2015-05-12: qty 2

## 2015-05-12 NOTE — ED Notes (Signed)
Patient seen in the [facility] emergency department 12/23 for n/v/d.  This has resolved, but for 3 days , patient has had a migraine.  Reports pain left side of head.  Reports this is typical pain.  Patient reports pain 5/10.  Reports pain in head does escalate and he feels sensitivity to sound

## 2015-05-12 NOTE — Discharge Instructions (Signed)
Bronchospasm, Adult A bronchospasm is a spasm or tightening of the airways going into the lungs. During a bronchospasm breathing becomes more difficult because the airways get smaller. When this happens there can be coughing, a whistling sound when breathing (wheezing), and difficulty breathing. Bronchospasm is often associated with asthma, but not all patients who experience a bronchospasm have asthma. CAUSES  A bronchospasm is caused by inflammation or irritation of the airways. The inflammation or irritation may be triggered by:   Allergies (such as to animals, pollen, food, or mold). Allergens that cause bronchospasm may cause wheezing immediately after exposure or many hours later.   Infection. Viral infections are believed to be the most common cause of bronchospasm.   Exercise.   Irritants (such as pollution, cigarette smoke, strong odors, aerosol sprays, and paint fumes).   Weather changes. Winds increase molds and pollens in the air. Rain refreshes the air by washing irritants out. Cold air may cause inflammation.   Stress and emotional upset.  SIGNS AND SYMPTOMS   Wheezing.   Excessive nighttime coughing.   Frequent or severe coughing with a simple cold.   Chest tightness.   Shortness of breath.  DIAGNOSIS  Bronchospasm is usually diagnosed through a history and physical exam. Tests, such as chest X-rays, are sometimes done to look for other conditions. TREATMENT   Inhaled medicines can be given to open up your airways and help you breathe. The medicines can be given using either an inhaler or a nebulizer machine.  Corticosteroid medicines may be given for severe bronchospasm, usually when it is associated with asthma. HOME CARE INSTRUCTIONS   Always have a plan prepared for seeking medical care. Know when to call your health care provider and local emergency services (911 in the [place] Know where you can access local emergency care.  Only take medicines as  directed by your health care provider.  If you were prescribed an inhaler or nebulizer machine, ask your health care provider to explain how to use it correctly. Always use a spacer with your inhaler if you were given one.  It is necessary to remain calm during an attack. Try to relax and breathe more slowly.  Control your home environment in the following ways:   Change your heating and air conditioning filter at least once a month.   Limit your use of fireplaces and wood stoves.  Do not smoke and do not allow smoking in your home.   Avoid exposure to perfumes and fragrances.   Get rid of pests (such as roaches and mice) and their droppings.   Throw away plants if you see mold on them.   Keep your house clean and dust free.   Replace carpet with wood, tile, or vinyl flooring. Carpet can trap dander and dust.   Use allergy-proof pillows, mattress covers, and box spring covers.   Wash bed sheets and blankets every week in hot water and dry them in a dryer.   Use blankets that are made of polyester or cotton.   Wash hands frequently. SEEK MEDICAL CARE IF:   You have muscle aches.   You have chest pain.   The sputum changes from clear or white to yellow, green, gray, or bloody.   The sputum you cough up gets thicker.   There are problems that may be related to the medicine you are given, such as a rash, itching, swelling, or trouble breathing.  SEEK IMMEDIATE MEDICAL CARE IF:   You have worsening wheezing and coughing  even after taking your prescribed medicines.   You have increased difficulty breathing.   You develop severe chest pain. MAKE SURE YOU:   Understand these instructions.  Will watch your condition.  Will get help right away if you are not doing well or get worse.   This information is not intended to replace advice given to you by your health care provider. Make sure you discuss any questions you have with your health care  provider.   Document Released: 05/05/2003 Document Revised: 05/23/2014 Document Reviewed: 10/22/2012 Elsevier Interactive Patient Education 2016 [employer].  Migraine Headache A migraine headache is an intense, throbbing pain on one or both sides of your head. A migraine can last for 30 minutes to several hours. CAUSES  The exact cause of a migraine headache is not always known. However, a migraine may be caused when nerves in the brain become irritated and release chemicals that cause inflammation. This causes pain. Certain things may also trigger migraines, such as:  Alcohol.  Smoking.  Stress.  Menstruation.  Aged cheeses.  Foods or drinks that contain nitrates, glutamate, aspartame, or tyramine.  Lack of sleep.  Chocolate.  Caffeine.  Hunger.  Physical exertion.  Fatigue.  Medicines used to treat chest pain (nitroglycerine), birth control pills, estrogen, and some blood pressure medicines. SIGNS AND SYMPTOMS  Pain on one or both sides of your head.  Pulsating or throbbing pain.  Severe pain that prevents daily activities.  Pain that is aggravated by any physical activity.  Nausea, vomiting, or both.  Dizziness.  Pain with exposure to bright lights, loud noises, or activity.  General sensitivity to bright lights, loud noises, or smells. Before you get a migraine, you may get warning signs that a migraine is coming (aura). An aura may include:  Seeing flashing lights.  Seeing bright spots, halos, or zigzag lines.  Having tunnel vision or blurred vision.  Having feelings of numbness or tingling.  Having trouble talking.  Having muscle weakness. DIAGNOSIS  A migraine headache is often diagnosed based on:  Symptoms.  Physical exam.  A CT scan or MRI of your head. These imaging tests cannot diagnose migraines, but they can help rule out other causes of headaches. TREATMENT Medicines may be given for pain and nausea. Medicines can also be  given to help prevent recurrent migraines.  HOME CARE INSTRUCTIONS  Only take over-the-counter or prescription medicines for pain or discomfort as directed by your health care provider. The use of long-term narcotics is not recommended.  Lie down in a dark, quiet room when you have a migraine.  Keep a journal to find out what may trigger your migraine headaches. For example, write down:  What you eat and drink.  How much sleep you get.  Any change to your diet or medicines.  Limit alcohol consumption.  Quit smoking if you smoke.  Get 7-9 hours of sleep, or as recommended by your health care provider.  Limit stress.  Keep lights dim if bright lights bother you and make your migraines worse. SEEK IMMEDIATE MEDICAL CARE IF:   Your migraine becomes severe.  You have a fever.  You have a stiff neck.  You have vision loss.  You have muscular weakness or loss of muscle control.  You start losing your balance or have trouble walking.  You feel faint or pass out.  You have severe symptoms that are different from your first symptoms. MAKE SURE YOU:   Understand these instructions.  Will watch your condition.  Will get help right away if you are not doing well or get worse.   This information is not intended to replace advice given to you by your health care provider. Make sure you discuss any questions you have with your health care provider.   Document Released: 05/02/2005 Document Revised: 05/23/2014 Document Reviewed: 01/07/2013 Elsevier Interactive Patient Education 2016 [employer].  How to Use an Inhaler Using your inhaler correctly is very important. Good technique will make sure that the medicine reaches your lungs.  HOW TO USE AN INHALER:  Take the cap off the inhaler.  If this is the first time using your inhaler, you need to prime it. Shake the inhaler for 5 seconds. Release four puffs into the air, away from your face. Ask your doctor for help if you  have questions.  Shake the inhaler for 5 seconds.  Turn the inhaler so the bottle is above the mouthpiece.  Put your pointer finger on top of the bottle. Your thumb holds the bottom of the inhaler.  Open your mouth.  Either hold the inhaler away from your mouth (the width of 2 fingers) or place your lips tightly around the mouthpiece. Ask your doctor which way to use your inhaler.  Breathe out as much air as possible.  Breathe in and push down on the bottle 1 time to release the medicine. You will feel the medicine go in your mouth and throat.  Continue to take a deep breath in very slowly. Try to fill your lungs.  After you have breathed in completely, hold your breath for 10 seconds. This will help the medicine to settle in your lungs. If you cannot hold your breath for 10 seconds, hold it for as long as you can before you breathe out.  Breathe out slowly, through pursed lips. Whistling is an example of pursed lips.  If your doctor has told you to take more than 1 puff, wait at least 15-30 seconds between puffs. This will help you get the best results from your medicine. Do not use the inhaler more than your doctor tells you to.  Put the cap back on the inhaler.  Follow the directions from your doctor or from the inhaler package about cleaning the inhaler. If you use more than one inhaler, ask your doctor which inhalers to use and what order to use them in. Ask your doctor to help you figure out when you will need to refill your inhaler.  If you use a steroid inhaler, always rinse your mouth with water after your last puff, gargle and spit out the water. Do not swallow the water. GET HELP IF:  The inhaler medicine only partially helps to stop wheezing or shortness of breath.  You are having trouble using your inhaler.  You have some increase in thick spit (phlegm). GET HELP RIGHT AWAY IF:  The inhaler medicine does not help your wheezing or shortness of breath or you have  tightness in your chest.  You have dizziness, headaches, or fast heart rate.  You have chills, fever, or night sweats.  You have a large increase of thick spit, or your thick spit is bloody. MAKE SURE YOU:   Understand these instructions.  Will watch your condition.  Will get help right away if you are not doing well or get worse.   This information is not intended to replace advice given to you by your health care provider. Make sure you discuss any questions you have with your health  care provider.   Document Released: 02/09/2008 Document Revised: 02/20/2013 Document Reviewed: 11/29/2012 Elsevier Interactive Patient Education 2016 Else[employer] Inc[employer]  Recurrent Migraine Headache A migraine headache is very bad, throbbing pain on one or both sides of your head. Recurrent migraines keep coming back. Talk to your doctor about what things may bring on (trigger) your migraine headaches. HOME CARE  Only take medicines as told by your doctor.  Lie down in a dark, quiet room when you have a migraine.  Keep a journal to find out if certain things bring on migraine headaches. For example, write down:  What you eat and drink.  How much sleep you get.  Any change to your diet or medicines.  Lessen how much alcohol you drink.  Quit smoking if you smoke.  Get enough sleep.  Lessen any stress in your life.  Keep lights dim if bright lights bother you or make your migraines worse. GET HELP IF:  Medicine does not help your migraines.  Your pain keeps coming back.  You have a fever. GET HELP RIGHT AWAY IF:   Your migraine becomes really bad.  You have a stiff neck.  You have trouble seeing.  Your muscles are weak, or you lose muscle control.  You lose your balance or have trouble walking.  You feel like you will pass out (faint), or you pass out.  You have really bad symptoms that are different than your first symptoms. MAKE SURE YOU:   Understand these  instructions.  Will watch your condition.  Will get help right away if you are not doing well or get worse.   This information is not intended to replace advice given to you by your health care provider. Make sure you discuss any questions you have with your health care provider.   Document Released: 02/09/2008 Document Revised: 05/07/2013 Document Reviewed: 01/07/2013 Elsevier Interactive Patient Education [employer].

## 2015-05-12 NOTE — ED Provider Notes (Signed)
CSN: [id number]     Arrival date & time 05/12/15  1446 History   First MD Initiated Contact with Patient 05/12/15 1636     Chief Complaint  Patient presents with  . Headache   (Consider location/radiation/quality/duration/timing/severity/associated sxs/prior Treatment) HPI Comments: 54 year old male with a history of HIV, migraine headaches and hypertension presents with a typical migraine headache for him. The headache started approximately 3 days ago when he visited the emergency department for gastroenteritis. At that time he received 3 L of normal saline as well as Zofran. He was discharged in stable condition and felt better. Since that time he has been drinking and his GI symptoms have been improving. He is now complaining of headache pain to the left temporoparietal areas as well as behind the left eye. Denies problems with vision speech hearing or swallowing. He does have photophobia and sonophobia. He tends to have waves of pain and at this point he has minimal pain. He describes it as throbbing and severe when it is present. Denies focal paresthesias or weakness. Denies asymmetric symptoms. He states there is some tenderness to the left temple and parietal area.   Past Medical History  Diagnosis Date  . HIV infection ([place])   . Migraine   . HTN (hypertension) 09/11/2014   Past Surgical History  Procedure Laterality Date  . Appendectomy  1990   Family History  Problem Relation Age of Onset  . Brain cancer Mother   . Testicular cancer Father   . Cancer Mother   . Cancer Father   . Stroke Mother   . Stroke Father   . Hypertension Father   . Diabetes Father    Social History  Substance Use Topics  . Smoking status: Current Every Day Smoker -- 0.30 packs/day for 30 years    Types: Cigarettes  . Smokeless tobacco: Never Used     Comment: trying to quit.  . Alcohol Use: 0.0 oz/week    0 Standard drinks or equivalent per week     Comment: occasional    Review of Systems   Constitutional: Positive for activity change. Negative for fever and fatigue.  Eyes: Positive for photophobia and pain.  Respiratory: Negative.   Cardiovascular: Negative.   Gastrointestinal:       GI symptoms from 3 and 4 days ago had substantially improved and he is no longer having vomiting.  Genitourinary: Negative.   Musculoskeletal: Negative.   Skin: Negative.   Neurological: Positive for dizziness and headaches. Negative for tremors, seizures, syncope, facial asymmetry, speech difficulty and numbness.  Hematological: Negative.   All other systems reviewed and are negative.   Allergies  Nicotine step  Home Medications   Prior to Admission medications   Medication Sig Start Date End Date Taking? Authorizing Provider  albuterol (PROVENTIL HFA;VENTOLIN HFA) 108 (90 Base) MCG/ACT inhaler Inhale 2 puffs into the lungs every 4 (four) hours as needed for wheezing or shortness of breath. 05/12/15   Janne Napoleon, NP  gabapentin (NEURONTIN) 300 MG capsule TAKE TWO   CAPSULES BY MOUTH   DAILY IN THE MORNING , THREE   CAPSULES DAILY IN THE EVENING 01/21/15   Cameron Sprang, MD  GENVOYA 150-150-200-10 MG TABS tablet TAKE 1 TABLET BY MOUTH DAILY WITH BREAKFAST. 04/13/15   Thayer Headings, MD  naproxen (NAPROSYN) 500 MG tablet TAKE 1 TABLET (500 MG TOTAL) BY MOUTH TWO TIMES DAILY WITH A MEAL. 01/22/15   Juliet Rude, MD  ondansetron (ZOFRAN) 4 MG tablet Take 1  tablet (4 mg total) by mouth every 6 (six) hours. 05/08/15   Okey Regal, PA-C  predniSONE (DELTASONE) 20 MG tablet Take 3 tabs po on first day, 2 tabs second day, 2 tabs third day, 1 tab fourth day, 1 tab 5th day. Take with food. 05/12/15   Janne Napoleon, NP   Meds Ordered and Administered this Visit   Medications  ketorolac (TORADOL) injection 60 mg (not administered)  diphenhydrAMINE (BENADRYL) injection 50 mg (not administered)  dexamethasone (DECADRON) injection 10 mg (not administered)    BP 137/88 mmHg  Pulse 60  Temp(Src) 97.8  F (36.6 C) (Oral)  Resp 22  SpO2 96% No data found.   Physical Exam  Constitutional: He is oriented to person, place, and time. He appears well-developed and well-nourished. No distress.  HENT:  Head: Normocephalic and atraumatic.  Mouth/Throat: No oropharyngeal exudate.  Bilateral TMs are normal. Oropharynx is clear. Soft palate rises symmetrically. Uvula and tongue midline.  Eyes: Conjunctivae and EOM are normal. Pupils are equal, round, and reactive to light.  Neck: Normal range of motion. Neck supple.  Cardiovascular: Normal rate, regular rhythm, normal heart sounds and intact distal pulses.   Pulmonary/Chest: Effort normal. No respiratory distress.  Bilateral expiratory coarseness and wheezing right greater than left.  Musculoskeletal: Normal range of motion. He exhibits no edema.  Lymphadenopathy:    He has no cervical adenopathy.  Neurological: He is alert and oriented to person, place, and time. He has normal strength. He displays no tremor. No cranial nerve deficit or sensory deficit. He exhibits normal muscle tone. Coordination and gait normal.  Skin: Skin is warm and dry. He is not diaphoretic.  Psychiatric: He has a normal mood and affect. His behavior is normal. Thought content normal.  Nursing note and vitals reviewed.   ED Course  Procedures (including critical care time)  Labs Review Labs Reviewed - No data to display  Imaging Review No results found.   Visual Acuity Review  Right Eye Distance:   Left Eye Distance:   Bilateral Distance:    Right Eye Near:   Left Eye Near:    Bilateral Near:         MDM   1. Migraine without aura and without status migrainosus, not intractable   2. Bronchospasm   3. Tobacco abuse disorder    patient is stable at this time with minimal headache symptoms. Neurologic exam is unremarkable. Albuterol HFA 2 puffs every 4 hours when necessary cough and wheezing Prednisone tapering dose starting tomorrow Today  Toradol 60 mg IM, dexamethasone 10 mg IM and Benadryl 50 mg IM. Stop smoking Continue to drink fluids and stay well-hydrated Follow-up with your PCP soon. For worsening may need to go to the emergency department.    Janne Napoleon, NP 05/12/15 1705

## 2015-07-21 ENCOUNTER — Encounter: Payer: Medicare Other | Admitting: Internal Medicine

## 2015-07-21 ENCOUNTER — Ambulatory Visit (INDEPENDENT_AMBULATORY_CARE_PROVIDER_SITE_OTHER): Payer: Medicare Other | Admitting: Internal Medicine

## 2015-07-21 ENCOUNTER — Encounter: Payer: Self-pay | Admitting: Internal Medicine

## 2015-07-21 VITALS — BP 134/74 | HR 66 | Temp 98.1°F | Ht 72.0 in | Wt 225.1 lb

## 2015-07-21 DIAGNOSIS — J069 Acute upper respiratory infection, unspecified: Secondary | ICD-10-CM

## 2015-07-21 DIAGNOSIS — Z6830 Body mass index (BMI) 30.0-30.9, adult: Secondary | ICD-10-CM | POA: Diagnosis not present

## 2015-07-21 DIAGNOSIS — E669 Obesity, unspecified: Secondary | ICD-10-CM

## 2015-07-21 DIAGNOSIS — I10 Essential (primary) hypertension: Secondary | ICD-10-CM | POA: Diagnosis not present

## 2015-07-21 DIAGNOSIS — G629 Polyneuropathy, unspecified: Secondary | ICD-10-CM

## 2015-07-21 DIAGNOSIS — G44009 Cluster headache syndrome, unspecified, not intractable: Secondary | ICD-10-CM

## 2015-07-21 DIAGNOSIS — Z21 Asymptomatic human immunodeficiency virus [HIV] infection status: Secondary | ICD-10-CM | POA: Diagnosis not present

## 2015-07-21 DIAGNOSIS — G479 Sleep disorder, unspecified: Secondary | ICD-10-CM

## 2015-07-21 DIAGNOSIS — R05 Cough: Secondary | ICD-10-CM

## 2015-07-21 DIAGNOSIS — B20 Human immunodeficiency virus [HIV] disease: Secondary | ICD-10-CM

## 2015-07-21 DIAGNOSIS — G6289 Other specified polyneuropathies: Secondary | ICD-10-CM

## 2015-07-21 DIAGNOSIS — E66811 Obesity, class 1: Secondary | ICD-10-CM

## 2015-07-21 DIAGNOSIS — G44019 Episodic cluster headache, not intractable: Secondary | ICD-10-CM

## 2015-07-21 NOTE — Patient Instructions (Addendum)
-   Try over the counter cough medicine such as Mucinex, Delsym, Robitussin - Drink plenty of fluids and rest - If your symptoms worsen or do not improve over the next 1-2 weeks then please return to clinic - Work on your diet- avoid fried foods, salty foods, sweets - Cut out daytime naps to help you sleep through the night. Avoid TV or being on your computer at least 1 hour before bed.  - Follow up with Dr. Linus Salmons - Stop taking naproxen every day. Only take if you have a headache. However, you could be getting headaches because of "withdrawal" from the naproxen.   General Instructions:   Please bring your medicines with you each time you come to clinic.  Medicines may include prescription medications, over-the-counter medications, herbal remedies, eye drops, vitamins, or other pills.   Progress Toward Treatment Goals:  No flowsheet data found.  Self Care Goals & Plans:  Self Care Goal 07/21/2015  Manage my medications take my medicines as prescribed; bring my medications to every visit; refill my medications on time  Monitor my health keep track of my blood pressure  Eat healthy foods eat more vegetables; eat foods that are low in salt; eat baked foods instead of fried foods  Be physically active find an activity I enjoy  Stop smoking cut down the number of cigarettes smoked    No flowsheet data found.   Care Management & Community Referrals:  No flowsheet data found.

## 2015-07-24 DIAGNOSIS — E669 Obesity, unspecified: Secondary | ICD-10-CM | POA: Insufficient documentation

## 2015-07-24 DIAGNOSIS — J069 Acute upper respiratory infection, unspecified: Secondary | ICD-10-CM | POA: Insufficient documentation

## 2015-07-24 NOTE — Assessment & Plan Note (Signed)
His symptoms seem most consistent with a viral URI. His HIV is well controlled so I am not concerned about an opportunistic infection. His physical exam was unremarkable. Advised conservative treatment including rest, increased fluid intake, and OTC medications to alleviate his symptoms. Instructed to return to clinic if his symptoms worsen or do not get better in the next 1-2 weeks.

## 2015-07-24 NOTE — Assessment & Plan Note (Signed)
Well controlled. Continue Genvoya per Dr. Linus Salmons. He will get labs when he has his follow up visit in April.

## 2015-07-24 NOTE — Assessment & Plan Note (Signed)
Denies any neuropathy symptoms and he has not been taking gabapentin. Advised him that we will not restart the gabapentin but if his symptoms reoccur that we may need to reconsider it.

## 2015-07-24 NOTE — Assessment & Plan Note (Signed)
He was surprised to hear that he had gained 23 lbs over the last year. I think this is a combination of dietary indiscretion and lack of exercise. I encouraged him to drink more water and incorporate more vegetables into his diet. I also advised him to cut back fried food/fried chicken to once a week. He is motivated to lose weight and is interested in meeting with Butch Penny to discuss nutrition. I will place this referral.

## 2015-07-24 NOTE — Assessment & Plan Note (Signed)
BP well controlled at 134/74. He is not on any medications. I will take this diagnosis off his problem list as he has not been hypertensive for the last 2 years.

## 2015-07-24 NOTE — Progress Notes (Signed)
   Subjective:    Patient ID: John Bullock, male    DOB: 1960-12-03, 55 y.o.   MRN: [id number]  HPI John Bullock is a 55yo man with PMHx of HTN, HIV, and tobacco abuse who presents today for follow up of his HTN.  HTN: BP controlled at 134/74. He is not on any antihypertensives.  HIV: Last CD4 710 and VL < 20 in August 2016. He follows with John Bullock and is scheduled to see him in April. He takes Genvoya 150-150-200-10 mg daily.  Productive Cough: He reports having a productive cough of green-yellow sputum for the past few days. He notes the sputum production is getting better. He reports associated pleuritic only when he has a "drastic coughing spell." He denies any fevers, chills, nausea, SOB, sore throat, body aches, and congestion. He reports his friend was sick with similar symptoms and thinks he got a cold from him.  Peripheral Neuropathy: He denies any tingling, numbness, or burning pain in his lower extremities. He has been off of gabapentin for several months now.  Headaches: He reports he started getting daily headaches a few months ago. He describes the headaches as one-sided (typically right sided) and located on the frontotemporal area. He notes occasional associated eye pain and that the headaches get better when he is in dark room. He notes associated light sensitivity. He started taking naproxen about 1 month ago to prevent the headaches and now is taking naproxen 500 mg daily. He reports the headaches stopped when he started the naproxen. He denies any vision changes, weakness, or numbness/tingling in his extremities.   Difficulty Sleeping: He reports he is able to get to sleep by 11 PM, but then will wake up around 3 AM and cannot get back to sleep until 5 AM. He admits taking afternoon naps daily for about 2 hours each time. He denies excessive caffeine use. He also admit to watching TV and being on his phone while in bed.  Obesity: He has gained about 23 lbs in the last year. He  admits to eating fried chicken and fast food about 3-4 times per week. He states he does not each much vegetables or fruit. He also admits not exercising for quite some time due to the cold weather.    Review of Systems General: Denies night sweats HEENT: Denies ear pain, changes in vision CV: Denies palpitations, orthopnea Pulm: Denies wheezing GI: Denies abdominal pain, nausea, vomiting, diarrhea, constipation, melena, hematochezia GU: Denies dysuria, hematuria, frequency Msk: Denies muscle cramps, joint pains Neuro: Denies weakness Skin: Denies rashes, bruising Psych: Denies depression, anxiety, hallucinations    Objective:   Physical Exam General: alert, sitting up, NAD HEENT: [place]/AT, EOMI, sclera anicteric, no tenderness to palpation of sinuses, pharynx non-erythematous, mucus membranes moist CV: RRR, no m/g/r Pulm: CTA bilaterally, breaths non-labored Abd: BS+, soft, obese, non-tender Ext: warm, no peripheral edema Neuro: alert and oriented x 3     Assessment & Plan:  Please refer to A&P documentation.

## 2015-07-24 NOTE — Assessment & Plan Note (Signed)
I think his sleep difficulties are likely related to him taking daytime naps. I advised him to break this habit. We also discussed sleep hygiene and avoiding TV and being on his phone/computer at least 1 hour before bedtime.

## 2015-07-24 NOTE — Progress Notes (Signed)
Internal Medicine Clinic Attending  Case discussed with Dr. Rivet at the time of the visit.  We reviewed the resident's history and exam and pertinent patient test results.  I agree with the assessment, diagnosis, and plan of care documented in the resident's note.  

## 2015-07-24 NOTE — Assessment & Plan Note (Signed)
He has a history of migraines but now I am concerned he may get medication overuse headaches while stopping the naproxen. I warned him of this possibility. He had a brain MRI in Sept 2015 which did not show any abnormalities to explain his migraines. We discussed that he may need to go on more appropriate medications such as topamax, a beta blocker, or a TCA to prevent migraines. Instructed to stop naproxen.

## 2015-08-06 ENCOUNTER — Ambulatory Visit: Payer: Medicare Other | Admitting: Dietician

## 2015-08-13 ENCOUNTER — Other Ambulatory Visit: Payer: Medicare Other

## 2015-08-13 ENCOUNTER — Other Ambulatory Visit (HOSPITAL_COMMUNITY)
Admission: RE | Admit: 2015-08-13 | Discharge: 2015-08-13 | Disposition: A | Discharge status: Home / Self Care | Payer: Medicare Other | Source: Ambulatory Visit | Attending: Internal Medicine | Admitting: Internal Medicine

## 2015-08-13 DIAGNOSIS — B20 Human immunodeficiency virus [HIV] disease: Secondary | ICD-10-CM | POA: Diagnosis not present

## 2015-08-13 DIAGNOSIS — Z113 Encounter for screening for infections with a predominantly sexual mode of transmission: Secondary | ICD-10-CM | POA: Insufficient documentation

## 2015-08-13 NOTE — Addendum Note (Signed)
Addended by: Janyce Llanos F on: 08/13/2015 11:16 AM   Modules accepted: Orders

## 2015-08-14 ENCOUNTER — Ambulatory Visit: Payer: Medicare Other | Admitting: Dietician

## 2015-08-14 LAB — HIV-1 RNA QUANT-NO REFLEX-BLD
HIV 1 RNA Quant: <20 copies/mL (ref <20)
HIV-1 RNA Quant, Log: <1.3 Log copies/mL (ref <1.30)

## 2015-08-14 LAB — RPR

## 2015-08-14 LAB — URINE CYTOLOGY ANCILLARY ONLY
Chlamydia: NEGATIVE
Neisseria Gonorrhea: NEGATIVE

## 2015-08-14 LAB — T-HELPER CELL (CD4) - (RCID CLINIC ONLY)
CD4 % Helper T Cell: 33 % (ref 33–55)
CD4 T Cell Abs: 570 /uL (ref 400–2700)

## 2015-08-20 NOTE — Addendum Note (Signed)
Addended by: Hulan Fray on: 08/20/2015 06:37 PM   Modules accepted: Orders

## 2015-08-27 ENCOUNTER — Encounter: Payer: Self-pay | Admitting: Internal Medicine

## 2015-08-27 ENCOUNTER — Ambulatory Visit (INDEPENDENT_AMBULATORY_CARE_PROVIDER_SITE_OTHER): Payer: Medicare Other | Admitting: Internal Medicine

## 2015-08-27 VITALS — BP 102/66 | HR 69 | Temp 98.2°F | Wt 216.0 lb

## 2015-08-27 DIAGNOSIS — B20 Human immunodeficiency virus [HIV] disease: Secondary | ICD-10-CM | POA: Diagnosis not present

## 2015-08-27 DIAGNOSIS — Z720 Tobacco use: Secondary | ICD-10-CM | POA: Diagnosis not present

## 2015-08-27 NOTE — Assessment & Plan Note (Signed)
Doing great.  rtc 6 months.  

## 2015-08-27 NOTE — Progress Notes (Signed)
   Subjective:    Patient ID: John Bullock, male    DOB: [birth year], 55 y.o.   MRN: [id number]  HPI Here for B20 follow up.   For his HIV, his last labs are good with CD4 of 570 and continued undetectable vl.  No new issues.  Continues on Genvoya.  Uses condoms always.  Considering a relationship with a male.  Still smoking though trying to quit.  Getting involved in HIV advocacy and recently went to [place].  Also part of the peer counseling.  Was recently told by his PCP that with a BMI of 30 he is obese.           Review of Systems  Constitutional: Negative for fatigue.  HENT: Negative for trouble swallowing.   Gastrointestinal: Negative for nausea and diarrhea.  Genitourinary: Negative for discharge, penile swelling and penile pain.  Skin: Negative for rash.  Neurological: Negative for dizziness.       Objective:   Physical Exam  Constitutional: He appears well-developed and well-nourished.  HENT:  Mouth/Throat: No oropharyngeal exudate.  Eyes: No scleral icterus.  Cardiovascular: Normal rate, regular rhythm and normal heart sounds.   No murmur heard. Pulmonary/Chest: Effort normal and breath sounds normal. No respiratory distress.  Lymphadenopathy:    He has no cervical adenopathy.  Skin: No rash noted.          Assessment & Plan:

## 2015-08-27 NOTE — Assessment & Plan Note (Signed)
- 

## 2015-09-18 ENCOUNTER — Other Ambulatory Visit: Payer: Self-pay | Admitting: Neurology

## 2015-11-09 ENCOUNTER — Other Ambulatory Visit: Payer: Self-pay | Admitting: Internal Medicine

## 2016-02-04 ENCOUNTER — Ambulatory Visit (INDEPENDENT_AMBULATORY_CARE_PROVIDER_SITE_OTHER): Payer: Medicare Other

## 2016-02-04 DIAGNOSIS — Z23 Encounter for immunization: Secondary | ICD-10-CM

## 2016-02-22 ENCOUNTER — Other Ambulatory Visit (HOSPITAL_COMMUNITY)
Admission: RE | Admit: 2016-02-22 | Discharge: 2016-02-22 | Disposition: A | Discharge status: Home / Self Care | Payer: Medicare Other | Source: Ambulatory Visit | Attending: Internal Medicine | Admitting: Internal Medicine

## 2016-02-22 ENCOUNTER — Encounter: Payer: Self-pay | Admitting: Internal Medicine

## 2016-02-22 ENCOUNTER — Ambulatory Visit (INDEPENDENT_AMBULATORY_CARE_PROVIDER_SITE_OTHER): Payer: Medicare Other | Admitting: Internal Medicine

## 2016-02-22 VITALS — BP 117/75 | HR 62 | Temp 98.2°F | Ht 72.0 in | Wt 219.7 lb

## 2016-02-22 DIAGNOSIS — R61 Generalized hyperhidrosis: Secondary | ICD-10-CM | POA: Diagnosis not present

## 2016-02-22 DIAGNOSIS — K5900 Constipation, unspecified: Secondary | ICD-10-CM | POA: Diagnosis not present

## 2016-02-22 DIAGNOSIS — Z113 Encounter for screening for infections with a predominantly sexual mode of transmission: Secondary | ICD-10-CM | POA: Insufficient documentation

## 2016-02-22 DIAGNOSIS — F17210 Nicotine dependence, cigarettes, uncomplicated: Secondary | ICD-10-CM

## 2016-02-22 DIAGNOSIS — R0981 Nasal congestion: Secondary | ICD-10-CM | POA: Diagnosis not present

## 2016-02-22 DIAGNOSIS — Z21 Asymptomatic human immunodeficiency virus [HIV] infection status: Secondary | ICD-10-CM | POA: Diagnosis present

## 2016-02-22 DIAGNOSIS — R509 Fever, unspecified: Secondary | ICD-10-CM | POA: Diagnosis not present

## 2016-02-22 DIAGNOSIS — B20 Human immunodeficiency virus [HIV] disease: Secondary | ICD-10-CM | POA: Insufficient documentation

## 2016-02-22 DIAGNOSIS — R05 Cough: Secondary | ICD-10-CM | POA: Diagnosis not present

## 2016-02-22 DIAGNOSIS — J069 Acute upper respiratory infection, unspecified: Secondary | ICD-10-CM

## 2016-02-22 MED ORDER — CETIRIZINE HCL 10 MG PO TABS: 10.0000 mg | ORAL_TABLET | Freq: Every day | ORAL | 1 refills | Status: DC

## 2016-02-22 MED ORDER — SALINE NASAL SPRAY 0.65 % NA SOLN
1.0000 | NASAL | 12 refills | Status: DC | PRN
Start: 2016-02-22 — End: 2017-10-11

## 2016-02-22 MED ORDER — SALINE NASAL SPRAY 0.65 % NA SOLN: 1.0000 | NASAL | 12 refills | Status: DC | PRN

## 2016-02-22 NOTE — Patient Instructions (Signed)
For your upper respiratory infection: --drink plenty of water --use mussinex (over the counter) --zyrtec (ceterizine) sent to your pharmacy --saline nasal spray sent to pharmacy  For constipation: --drink plenty of water --use miralaax daily until you don't have to strain to have BM  Please make an appointment with Dr. Linus Salmons to follow up for you HIV disease. We drew your labs so they should be ready for when you have your appointment.

## 2016-02-22 NOTE — Progress Notes (Signed)
   CC: cough and congestion  HPI:  [name] is a 55 y.o. with a PMH of HIV on ART, hypertension and migraine presenting to clinic for a 4 day history of congestion, cough productive of small amount of green sputum, one episode of subjective fever and night sweats. He denies sick contacts. He has tried [employer], advertising one time and it helped improve his symptoms, but has not taken it since. He has also taken one naproxen without relief.   He has had well-controlled HIV on Genvoya and follows with ID. Last viral load in April 2017 was undetectable and CD4 count was 570. He is due for a 6 month f/u with ID but does not have an appointment set up yet; there are pending lab orders.  Please see problem based Assessment and Plan for status of patients chronic conditions.  Past Medical History:  Diagnosis Date  . HIV infection ([place])   . HTN (hypertension) 09/11/2014  . Migraine     Review of Systems:   Review of Systems  Constitutional: Positive for diaphoresis (night sweats for 2 nights) and fever. Negative for chills (one subjective).  Respiratory: Positive for cough and sputum production. Negative for hemoptysis, shortness of breath and wheezing.   Cardiovascular: Positive for chest pain. Orthopnea: with cough.  Gastrointestinal: Negative for abdominal pain, constipation, diarrhea, nausea and vomiting.  Skin: Negative for rash.  Neurological: Negative for headaches.    Physical Exam:  Vitals:   02/22/16 1326  BP: 117/75  Pulse: 62  Temp: 98.2 F (36.8 C)  TempSrc: Oral  SpO2: 99%  Weight: 219 lb 11.2 oz (99.7 kg)  Height: 6' (1.829 m)   Physical Exam  Constitutional: NAD, pleasant HEENT: mildly erythematous oropharynx without exudate, erythematous nasal conchi, no tenderness over frontal or maxillary sinuses, no lymphadenopathy CV: RRR, no murmurs, rubs or gallops Resp: CTAB, no increased work of breathing, no crackles or wheezing appreciated Abd: soft, +BS, NTND Ext:  warm, +2 pulses throughout  Assessment & Plan:   See Encounters Tab for problem based charting.   Patient seen with Dr. Carmel [place], MD Internal Medicine PGY1

## 2016-02-23 LAB — CBC WITH DIFFERENTIAL/PLATELET
Basophils Absolute: 0 10*3/uL (ref 0.0–0.2)
Basos: 0 %
EOS (ABSOLUTE): 0 10*3/uL (ref 0.0–0.4)
Eos: 1 %
Hematocrit: 40.9 % (ref 37.5–51.0)
Hemoglobin: 13.3 g/dL (ref 12.6–17.7)
Immature Grans (Abs): 0 10*3/uL (ref 0.0–0.1)
Immature Granulocytes: 0 %
Lymphocytes Absolute: 2 10*3/uL (ref 0.7–3.1)
Lymphs: 48 %
MCH: 29.1 pg (ref 26.6–33.0)
MCHC: 32.5 g/dL (ref 31.5–35.7)
MCV: 90 fL (ref 79–97)
Monocytes Absolute: 0.7 10*3/uL (ref 0.1–0.9)
Monocytes: 17 %
Neutrophils Absolute: 1.4 10*3/uL (ref 1.4–7.0)
Neutrophils: 34 %
Platelets: 199 10*3/uL (ref 150–379)
RBC: 4.57 x10E6/uL (ref 4.14–5.80)
RDW: 14.3 % (ref 12.3–15.4)
WBC: 4.2 10*3/uL (ref 3.4–10.8)

## 2016-02-23 LAB — CMP14 + ANION GAP
ALT: 13 IU/L (ref 0–44)
AST: 28 IU/L (ref 0–40)
Albumin/Globulin Ratio: 1.8 (ref 1.2–2.2)
Albumin: 4.3 g/dL (ref 3.5–5.5)
Alkaline Phosphatase: 64 IU/L (ref 39–117)
Anion Gap: 18 mmol/L (ref 10.0–18.0)
BUN/Creatinine Ratio: 11 (ref 9–20)
BUN: 13 mg/dL (ref 6–24)
Bilirubin Total: <0.2 mg/dL (ref 0.0–1.2)
CO2: 21 mmol/L (ref 18–29)
Calcium: 8.6 mg/dL — ABNORMAL LOW (ref 8.7–10.2)
Chloride: 104 mmol/L (ref 96–106)
Creatinine, Ser: 1.15 mg/dL (ref 0.76–1.27)
GFR calc Af Amer: 83 mL/min/{1.73_m2} (ref >59)
GFR calc non Af Amer: 72 mL/min/{1.73_m2} (ref >59)
Globulin, Total: 2.4 g/dL (ref 1.5–4.5)
Glucose: 83 mg/dL (ref 65–99)
Potassium: 4.6 mmol/L (ref 3.5–5.2)
Sodium: 143 mmol/L (ref 134–144)
Total Protein: 6.7 g/dL (ref 6.0–8.5)

## 2016-02-23 LAB — URINE CYTOLOGY ANCILLARY ONLY
Chlamydia: NEGATIVE
Neisseria Gonorrhea: NEGATIVE
Trichomonas: NEGATIVE

## 2016-02-23 LAB — T-HELPER CELLS (CD4) COUNT (NOT AT ARMC)
CD4 % Helper T Cell: 34 % (ref 33–55)
CD4 T Cell Abs: 830 /uL (ref 400–2700)

## 2016-02-23 LAB — RPR: RPR Ser Ql: NONREACTIVE

## 2016-02-24 LAB — URINE CYTOLOGY ANCILLARY ONLY
Bacterial vaginitis: POSITIVE — AB
Candida vaginitis: NEGATIVE

## 2016-02-24 LAB — HIV-1 RNA ULTRAQUANT REFLEX TO GENTYP+
HIV1 RNA # SerPl PCR: 480 copies/mL
HIV1 RNA Plas PCR-Log#: 2.681 log10copy/mL

## 2016-02-24 NOTE — Assessment & Plan Note (Addendum)
Symptoms and exam are consistent with acute viral URI. He has well controlled HIV disease so I'm not worried about opportunistic infection. Will do symptomatic treatment with increased fluid intake, saline nasal spray, zyrtec. Told patient to avoid Flonase due to interaction with Jorje Guild

## 2016-02-24 NOTE — Progress Notes (Signed)
Internal Medicine Clinic Attending  I saw and evaluated the patient.  I personally confirmed the key portions of the history and exam documented by Dr. Svalina and I reviewed pertinent patient test results.  The assessment, diagnosis, and plan were formulated together and I agree with the documentation in the resident's note.  

## 2016-02-24 NOTE — Assessment & Plan Note (Addendum)
Patient well controlled on Genvoya. Last HIV viral load in April 2017 was undetectable, CD4 count >500. He is due for 6 month follow up with ID. Has standing labs.  Plan: --Obtained labs needed for ID appointment --HIV load 480*, CD4 count >800, RPR negative, urine cytology negative, CMP and CBC unremarkable --advised patient to call and set up ID appointment

## 2016-02-25 ENCOUNTER — Telehealth: Payer: Self-pay | Admitting: *Deleted

## 2016-02-25 NOTE — Telephone Encounter (Signed)
Call made to patient- has been scheduled to see DrComer on 03/08/16 @ 10am. Pt now aware, phone call complete.John Bullock, [name]10/12/20179:02 AM

## 2016-03-08 ENCOUNTER — Encounter: Payer: Self-pay | Admitting: Internal Medicine

## 2016-03-08 ENCOUNTER — Ambulatory Visit (INDEPENDENT_AMBULATORY_CARE_PROVIDER_SITE_OTHER): Payer: Medicare Other | Admitting: Internal Medicine

## 2016-03-08 VITALS — BP 111/77 | HR 55 | Temp 97.9°F | Ht 73.0 in | Wt 214.0 lb

## 2016-03-08 DIAGNOSIS — Z720 Tobacco use: Secondary | ICD-10-CM

## 2016-03-08 DIAGNOSIS — B20 Human immunodeficiency virus [HIV] disease: Secondary | ICD-10-CM | POA: Diagnosis not present

## 2016-03-08 DIAGNOSIS — J069 Acute upper respiratory infection, unspecified: Secondary | ICD-10-CM | POA: Diagnosis not present

## 2016-03-08 NOTE — Assessment & Plan Note (Signed)
I will recheck the viral load.  I suspect it was up due to a processing issue since he denies missing any doses.  If ok, rtc 6 months.

## 2016-03-08 NOTE — Assessment & Plan Note (Signed)
Still having some symptoms.  I emphasized smoking cessation to improve this.

## 2016-03-08 NOTE — Progress Notes (Signed)
   Subjective:    Patient ID: John Bullock, male    DOB: 1961/02/21, 55 y.o.   MRN: [id number]  Cough  Pertinent negatives include no rash.   Here for B20 follow up.   For his HIV, his last labs show a CD4 of 830 and a viral load that is 480 copies.  This was done by his PCP.   No new issues.  Continues on Genvoya.  Uses condoms always.   Still smoking though trying to quit still.  Recently with a URI-like symptoms and still with a cough.  No weight loss.            Review of Systems  Constitutional: Negative for fatigue.  HENT: Negative for trouble swallowing.   Respiratory: Positive for cough.   Gastrointestinal: Negative for diarrhea and nausea.  Genitourinary: Negative for discharge, penile pain and penile swelling.  Skin: Negative for rash.  Neurological: Negative for dizziness.       Objective:   Physical Exam  Constitutional: He appears well-developed and well-nourished.  HENT:  Mouth/Throat: No oropharyngeal exudate.  Eyes: No scleral icterus.  Cardiovascular: Normal rate, regular rhythm and normal heart sounds.   No murmur heard. Pulmonary/Chest: Effort normal and breath sounds normal. No respiratory distress.  Lymphadenopathy:    He has no cervical adenopathy.  Skin: No rash noted.   Social History   Social History  . Marital status: Single    Spouse name: N/A  . Number of children: N/A  . Years of education: N/A   Occupational History  . Not on file.   Social History Main Topics  . Smoking status: Current Every Day Smoker    Packs/day: 0.50    Years: 30.00    Types: Cigarettes  . Smokeless tobacco: Never Used     Comment: trying to quit.  . Alcohol use 2.4 oz/week    4 Shots of liquor per week     Comment: on Thursdays with [employer] game  . Drug use: No  . Sexual activity: Yes    Partners: Male    Birth control/ protection: Condom     Comment: given condoms   Other Topics Concern  . Not on file   Social History Narrative  . No narrative on  file   Trying to cut down to one cigarette per day        Assessment & Plan:

## 2016-03-08 NOTE — Assessment & Plan Note (Signed)
Discussed cessation again

## 2016-03-09 IMAGING — MR MR MRA HEAD W/O CM
6 of 7 series · 36 of 48 positions shown · non-contrast
Comparison: CT head from the same day.

CLINICAL DATA: HIV. New onset headache and left-sided weakness. The
weakness began 2 days ago.

The examination had to be discontinued prior to completion due to
patient refusal for further imaging.
EXAM:
MRI HEAD WITHOUT CONTRAST
MRA HEAD WITHOUT CONTRAST
TECHNIQUE: Multiplanar, multiecho pulse sequences of the brain and surrounding
structures were obtained without intravenous contrast. Angiographic
images of the head were obtained using MRA technique without
contrast.

[Series 3: T1 · sagittal · 5.0mm · 0.47mm/px · 4 of 25 slices shown]
[im 1/25]
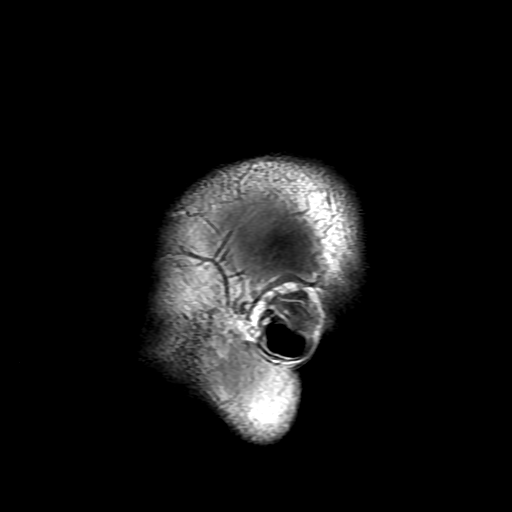
[im 9/25]
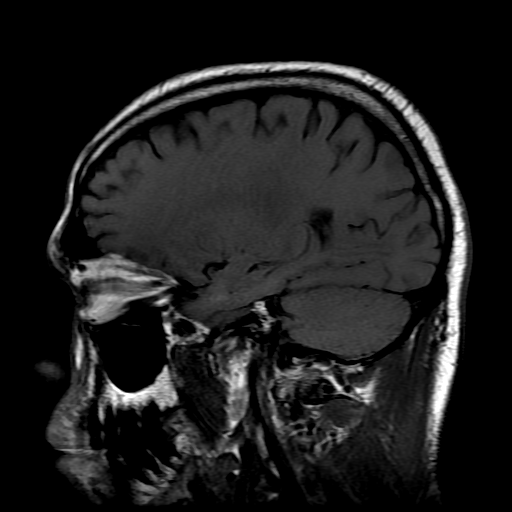
[im 17/25]
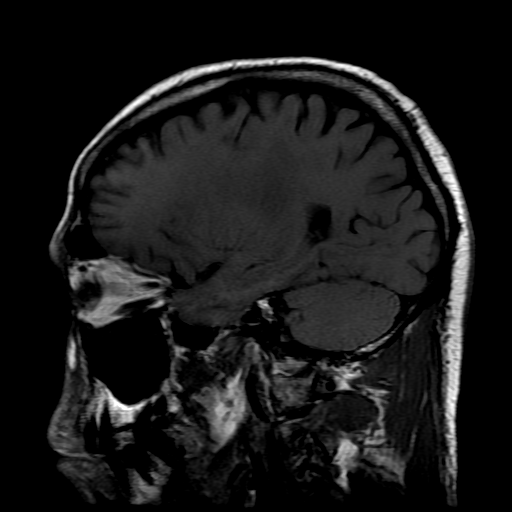
[im 25/25]
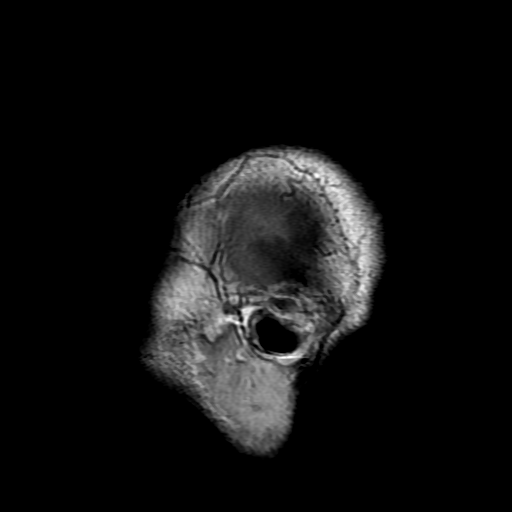

[Series 4: DWI · axial · 5.0mm · 1.09mm/px · z∈[-78,+72]mm · 8 of 62 slices shown (1 of 4)]
[im 1/62]
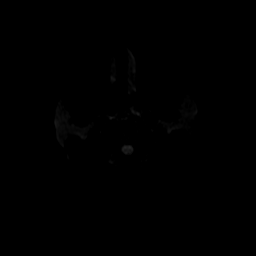
[im 9/62]
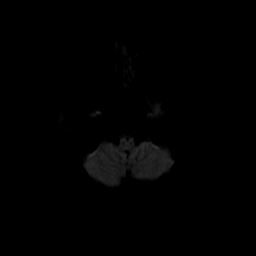
[im 18/62]
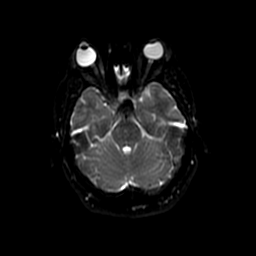
[im 27/62]
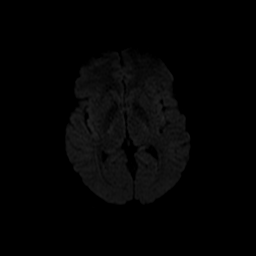
[im 35/62]
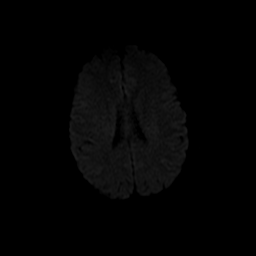
[im 44/62]
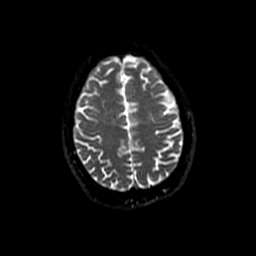
[im 53/62]
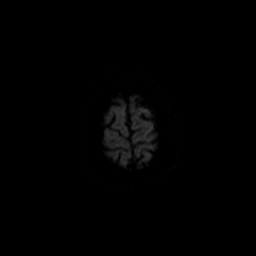
[im 62/62]
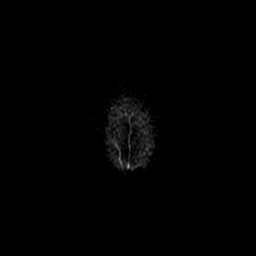

[Series 5: DWI · coronal · 5.0mm · 1.09mm/px · 9 of 66 slices shown (2 of 4)]
[im 1/66]
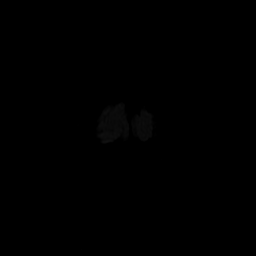
[im 9/66]
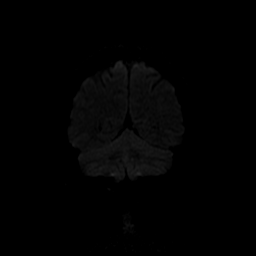
[im 17/66]
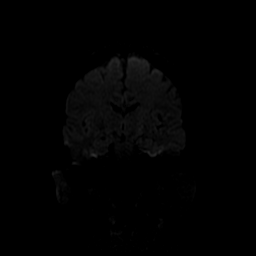
[im 25/66]
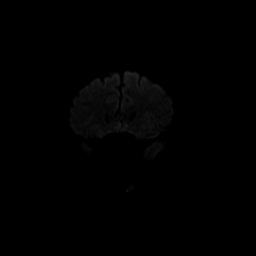
[im 33/66]
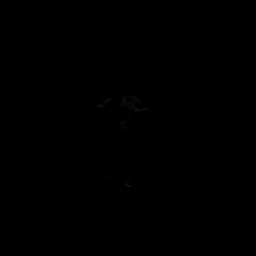
[im 41/66]
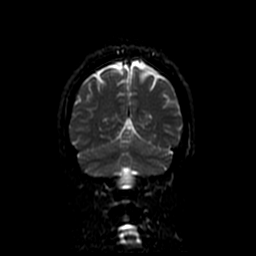
[im 49/66]
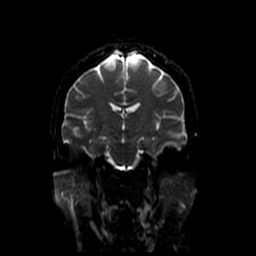
[im 57/66]
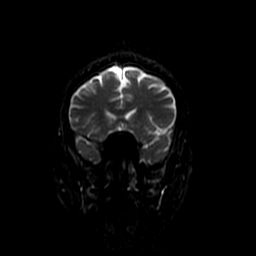
[im 66/66]
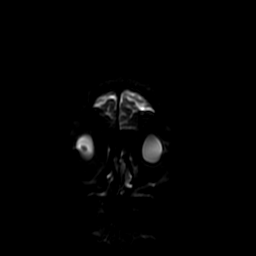

[Series 6: (id) mt fs · axial · 1.4mm · 0.43mm/px · z∈[-79,-7]mm · 7 of 136 slices shown]
[im 8/136]
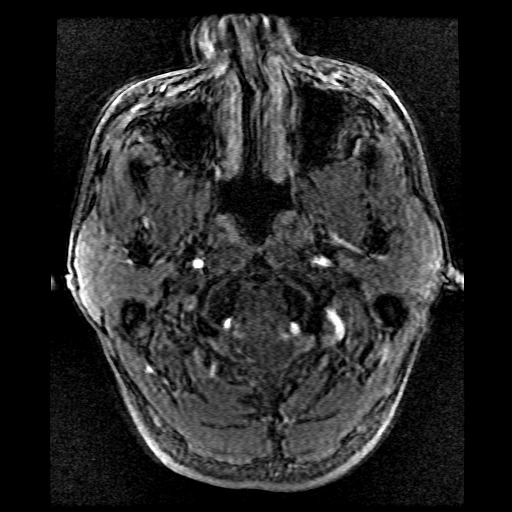
[im 24/136]
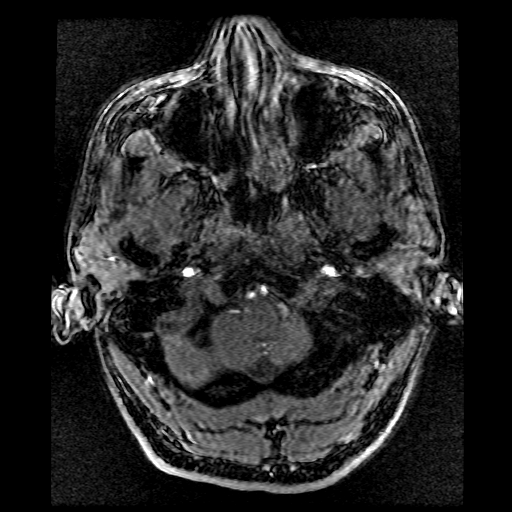
[im 40/136]
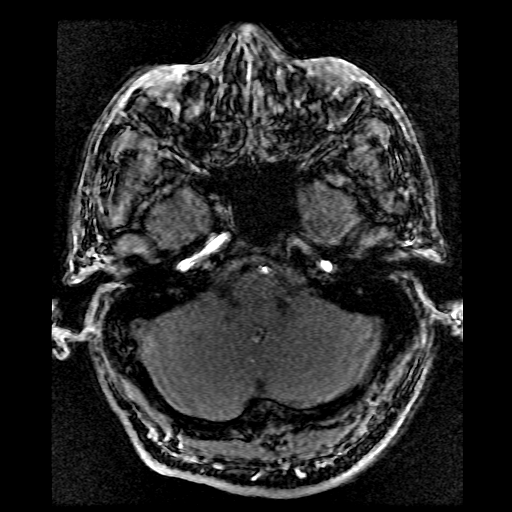
[im 56/136]
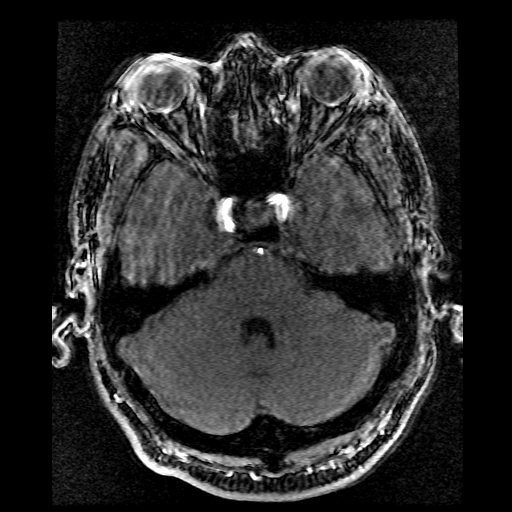
[im 80/136]
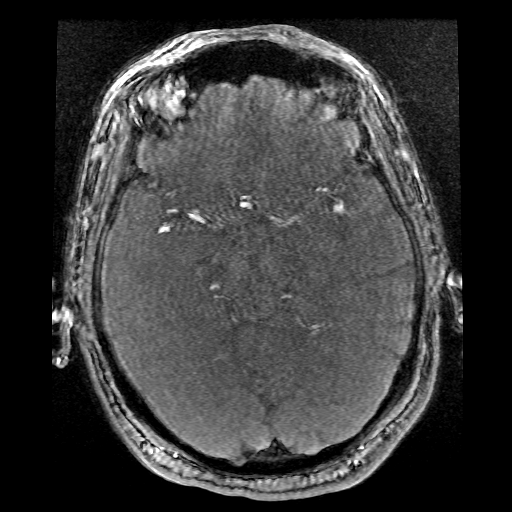
[im 96/136]
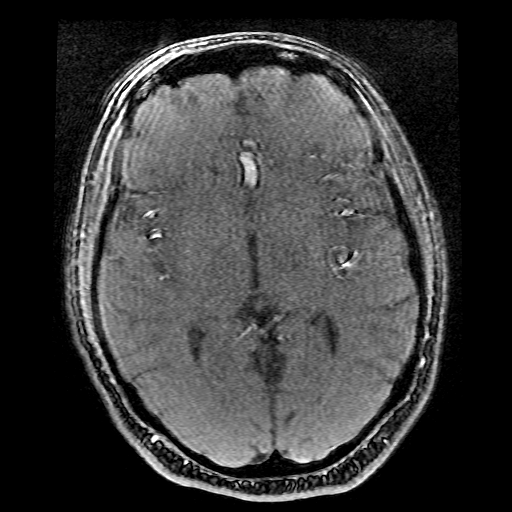
[im 112/136]
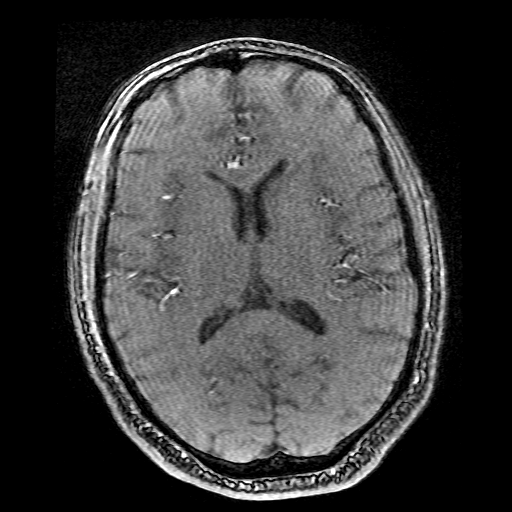

[Series 400: DWI · axial · 5.0mm · 1.09mm/px · z∈[-78,+72]mm · 4 of 31 slices shown (3 of 4)]
[im 1/31]
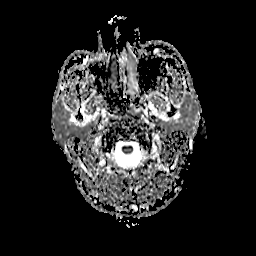
[im 11/31]
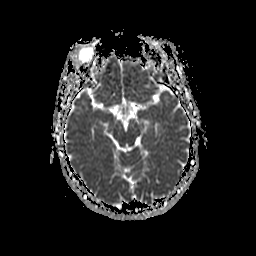
[im 21/31]
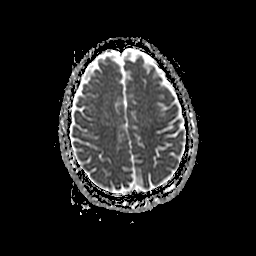
[im 31/31]
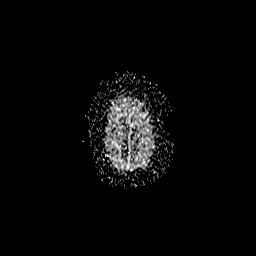

[Series 500: DWI · coronal · 5.0mm · 1.09mm/px · 4 of 33 slices shown (4 of 4)]
[im 1/33]
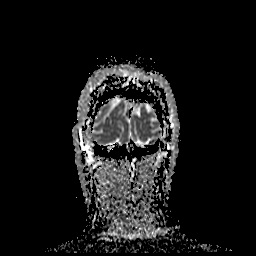
[im 11/33]
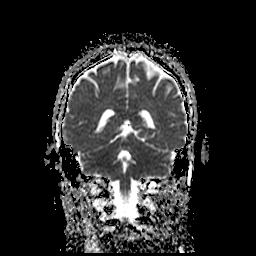
[im 22/33]
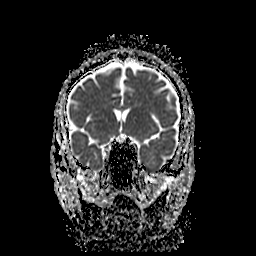
[im 33/33]
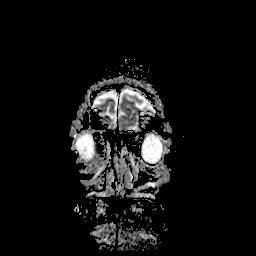

[36 of 48 positions shown; findings below may reference images not displayed]

FINDINGS: MRI HEAD FINDINGS

The diffusion-weighted images demonstrate no evidence for acute or
subacute infarction. Only knee diffusion-weighted imaging and axial
T1 weighted imaging was performed before the patient discontinued
the exam.

No hemorrhage or mass lesion is present. The ventricles are of
normal size. No significant extra-axial fluid collection is present.

MRA HEAD FINDINGS

The time-of-flight images are moderately distorted by patient
motion. This limits evaluation of media min small vessels.

Internal carotid arteries are patent bilaterally. There is no
significant focal stenosis. The A1 and M1 segments could be within
normal limits. The MCA bifurcations are intact. The proximal MCA
branch vessels are normal.

The vertebral arteries are codominant. The PICA origins are
visualized and normal. The basilar artery is intact. Both posterior
cerebral arteries originate from the basilar tip.
IMPRESSION: 1. No evidence for acute infarct.
2. The study was discontinued by the patient and several sequences
were not performed. The sequences which were performed are normal.
3. No significant proximal stenosis, aneurysm, or branch vessel
occlusion.
4. The MRA is severely degraded by patient motion, limiting
evaluation of media min small vessels.

## 2016-03-09 IMAGING — CR DG CHEST 2V
2 series · 2 of 2 positions shown · non-contrast
Comparison: 01/03/2013

CLINICAL DATA: Stroke.

EXAM:
CHEST - 2 VIEW

[w chest pa]
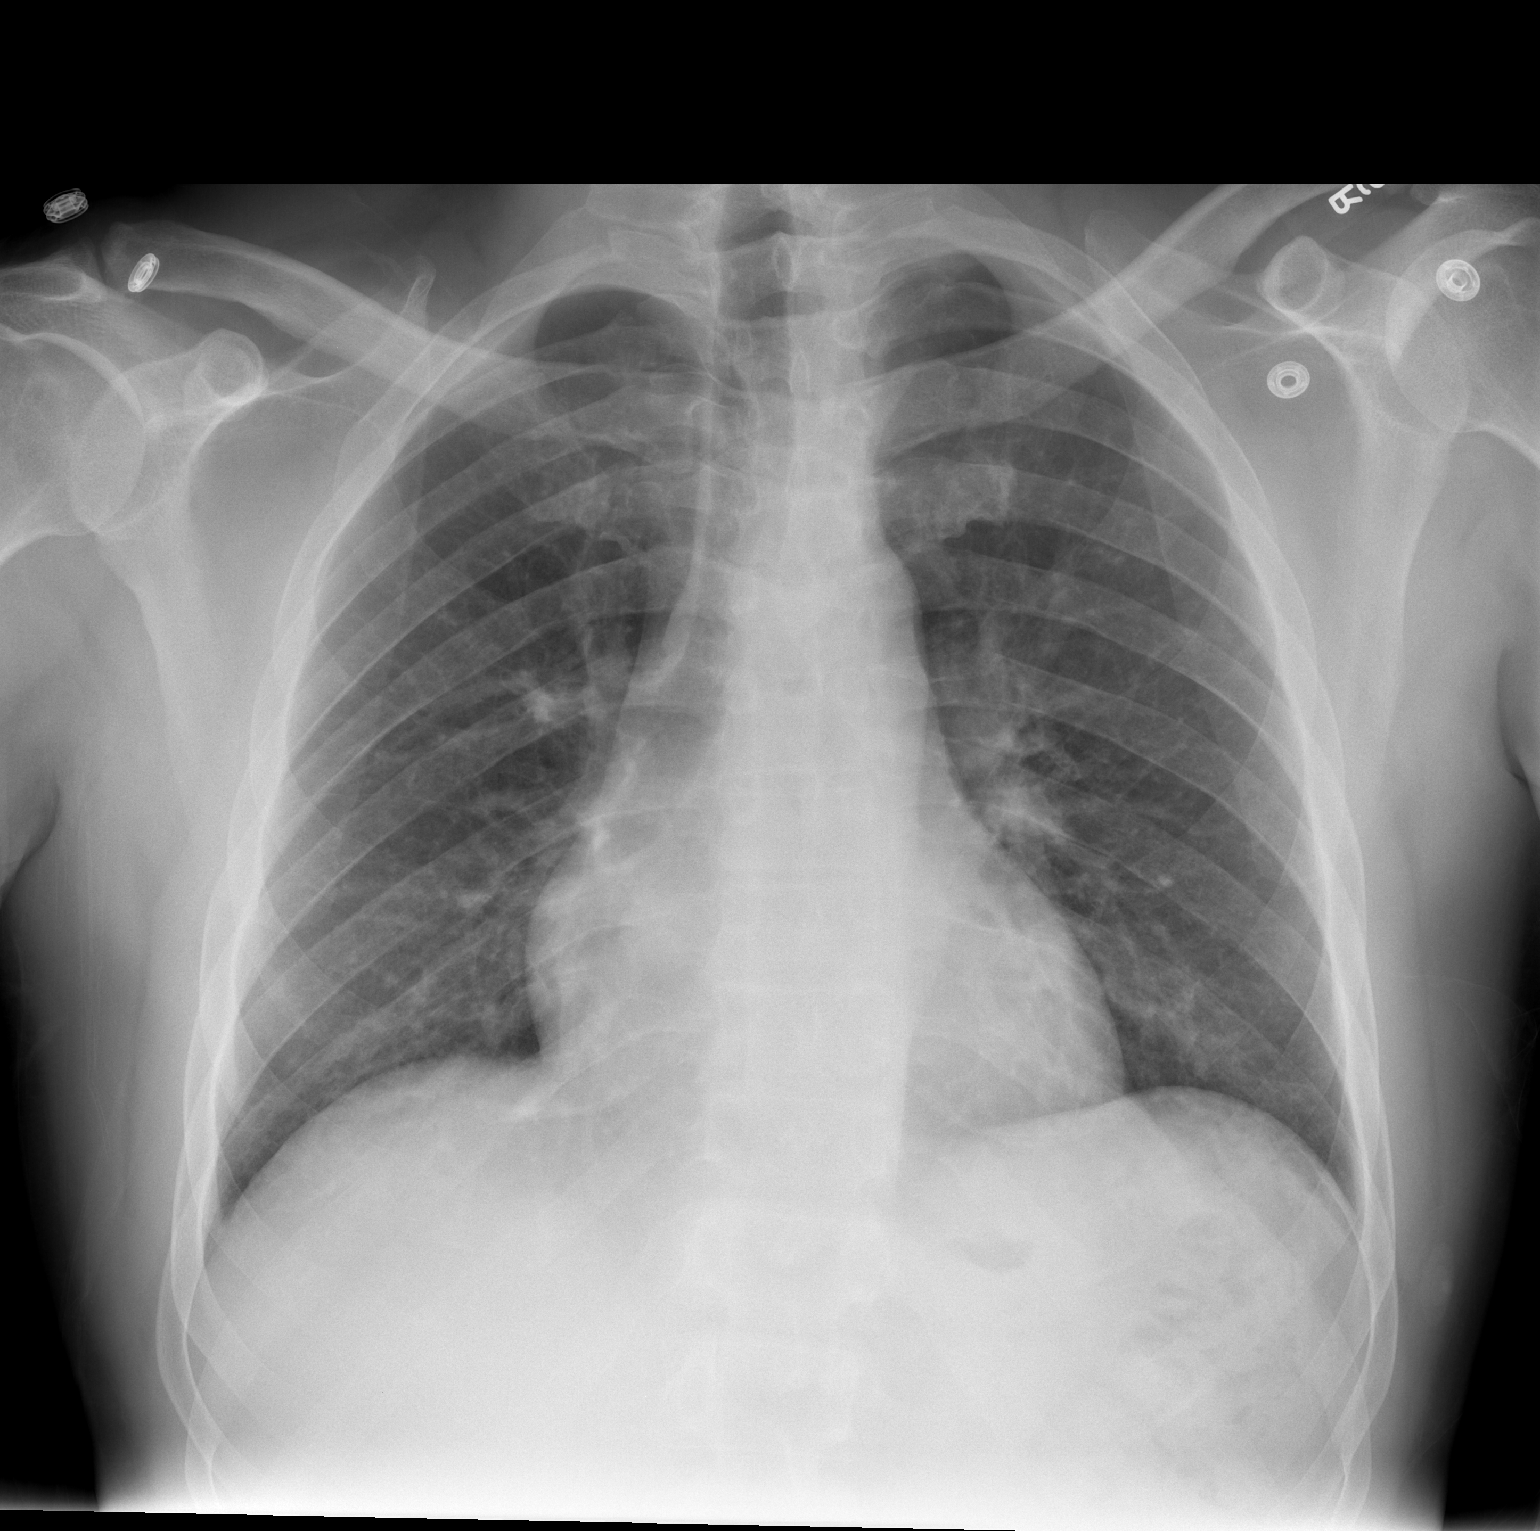

[w chest lat]
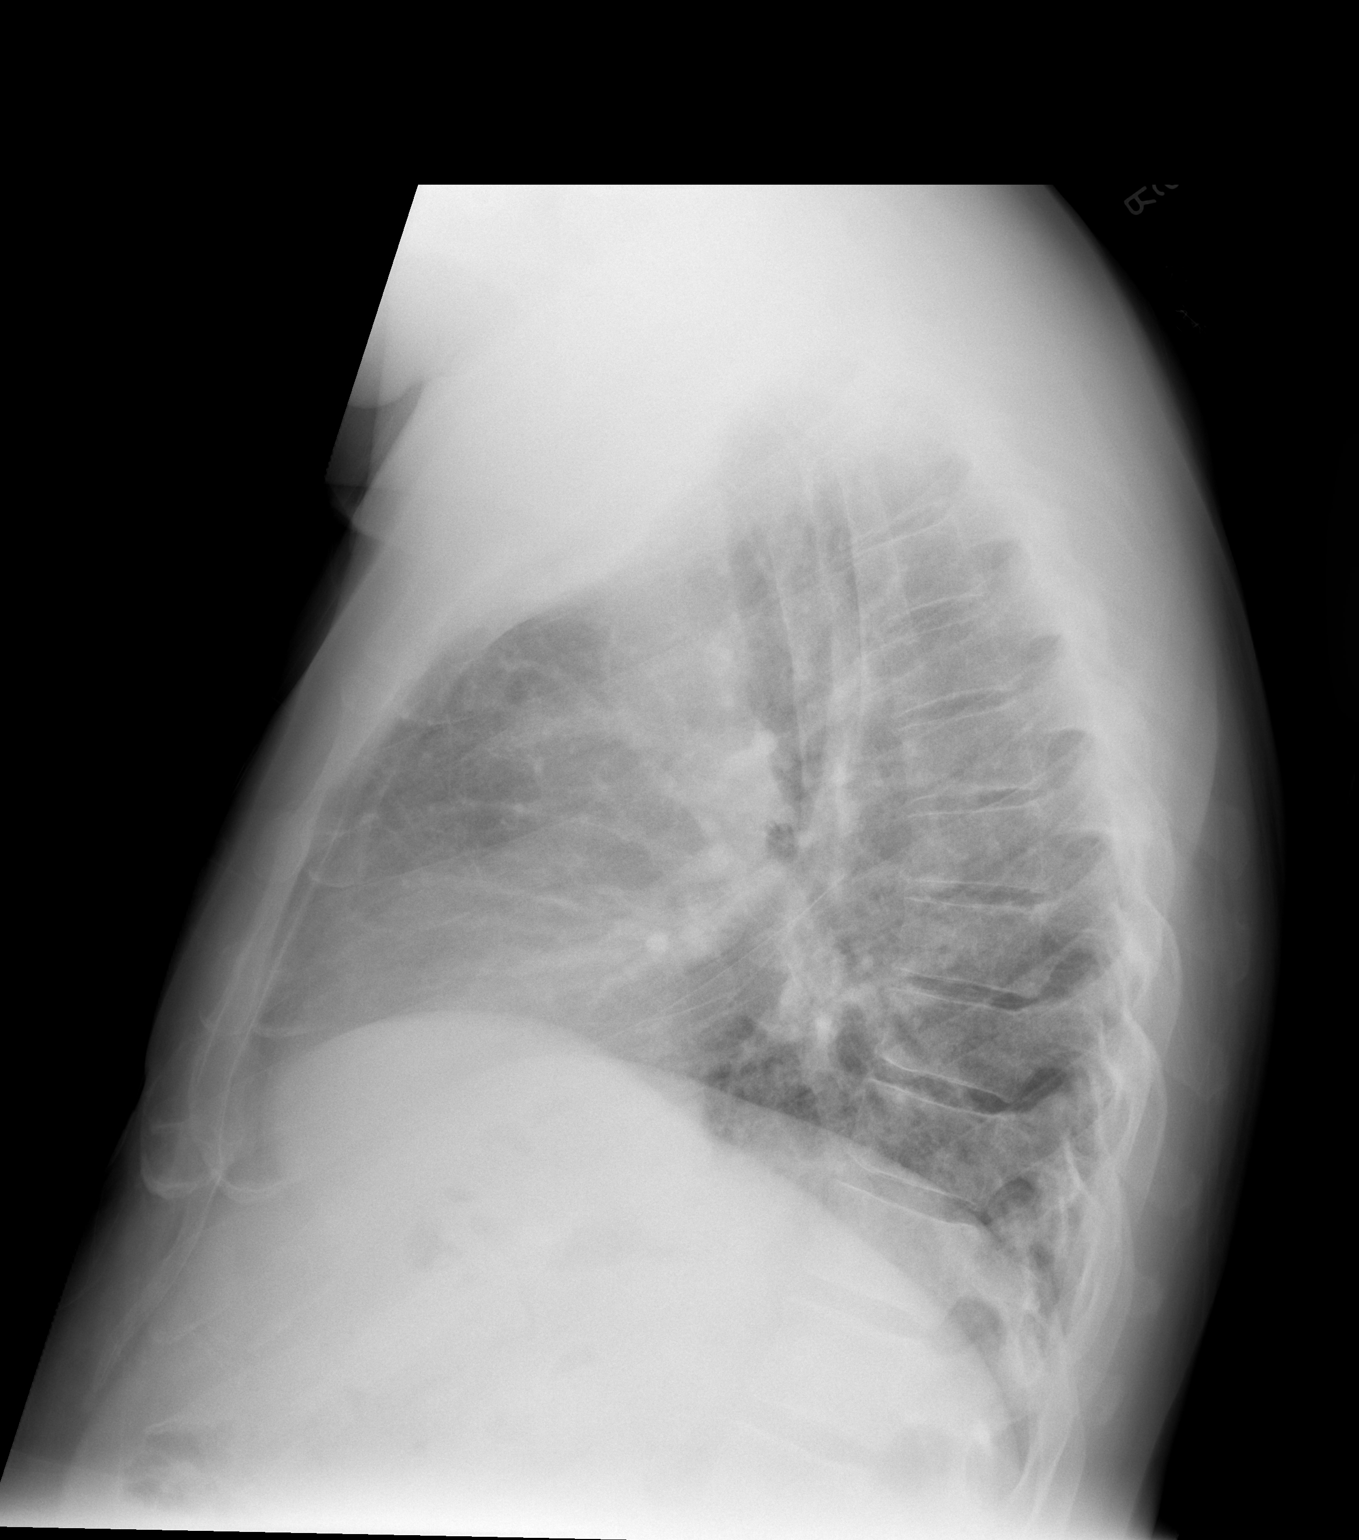

[2 of 2 positions shown; findings below may reference images not displayed]

FINDINGS: The heart size and mediastinal contours are within normal limits.
Mild bibasilar atelectasis present. There is no evidence of
pulmonary edema, consolidation, pneumothorax, nodule or pleural
fluid. The visualized skeletal structures are unremarkable.
IMPRESSION: No active disease.

## 2016-03-10 LAB — HIV-1 RNA QUANT-NO REFLEX-BLD
HIV 1 RNA Quant: 83 copies/mL — ABNORMAL HIGH (ref <20)
HIV-1 RNA Quant, Log: 1.92 Log copies/mL — ABNORMAL HIGH (ref <1.30)

## 2016-03-18 ENCOUNTER — Other Ambulatory Visit: Payer: Self-pay | Admitting: Infectious Diseases

## 2016-03-18 DIAGNOSIS — B20 Human immunodeficiency virus [HIV] disease: Secondary | ICD-10-CM

## 2016-06-27 ENCOUNTER — Other Ambulatory Visit: Payer: Self-pay | Admitting: Internal Medicine

## 2016-06-27 DIAGNOSIS — J069 Acute upper respiratory infection, unspecified: Secondary | ICD-10-CM

## 2016-06-28 DIAGNOSIS — B20 Human immunodeficiency virus [HIV] disease: Secondary | ICD-10-CM | POA: Diagnosis not present

## 2016-06-28 DIAGNOSIS — Z006 Encounter for examination for normal comparison and control in clinical research program: Secondary | ICD-10-CM | POA: Diagnosis not present

## 2016-06-28 DIAGNOSIS — Z0189 Encounter for other specified special examinations: Secondary | ICD-10-CM | POA: Diagnosis not present

## 2016-06-28 DIAGNOSIS — K6282 Dysplasia of anus: Secondary | ICD-10-CM | POA: Diagnosis not present

## 2016-06-28 NOTE — Telephone Encounter (Signed)
Patient needs appointment with me. Has not been seen in almost 1 year

## 2016-07-28 ENCOUNTER — Other Ambulatory Visit: Payer: Self-pay | Admitting: Internal Medicine

## 2016-07-28 DIAGNOSIS — J069 Acute upper respiratory infection, unspecified: Secondary | ICD-10-CM

## 2016-08-15 ENCOUNTER — Telehealth: Payer: Self-pay | Admitting: Internal Medicine

## 2016-08-15 NOTE — Telephone Encounter (Signed)
APT. REMINDER CALL, LMTCB °

## 2016-08-16 ENCOUNTER — Encounter: Payer: Medicare Other | Admitting: Internal Medicine

## 2016-08-22 ENCOUNTER — Other Ambulatory Visit: Payer: Medicare Other

## 2016-08-22 ENCOUNTER — Encounter (INDEPENDENT_AMBULATORY_CARE_PROVIDER_SITE_OTHER): Payer: Medicare Other | Admitting: *Deleted

## 2016-08-22 VITALS — BP 116/78 | HR 60 | Temp 97.8°F | Ht 72.0 in | Wt 213.5 lb

## 2016-08-22 DIAGNOSIS — E7801 Familial hypercholesterolemia: Secondary | ICD-10-CM | POA: Diagnosis not present

## 2016-08-22 DIAGNOSIS — Z006 Encounter for examination for normal comparison and control in clinical research program: Secondary | ICD-10-CM

## 2016-08-22 DIAGNOSIS — B20 Human immunodeficiency virus [HIV] disease: Secondary | ICD-10-CM

## 2016-08-22 LAB — LIPID PANEL
Cholesterol: 217 mg/dL — ABNORMAL HIGH (ref <200)
HDL: 88 mg/dL (ref >40)
LDL Cholesterol: 104 mg/dL — ABNORMAL HIGH (ref <100)
Total CHOL/HDL Ratio: 2.5 Ratio (ref <5.0)
Triglycerides: 124 mg/dL (ref <150)
VLDL: 25 mg/dL (ref <30)

## 2016-08-22 LAB — CBC WITH DIFFERENTIAL/PLATELET
Basophils Absolute: 0 cells/uL (ref 0–200)
Basophils Relative: 0 %
Eosinophils Absolute: 41 cells/uL (ref 15–500)
Eosinophils Relative: 1 %
HCT: 43.5 % (ref 38.5–50.0)
Hemoglobin: 14.5 g/dL (ref 13.2–17.1)
Lymphocytes Relative: 46 %
Lymphs Abs: 1886 cells/uL (ref 850–3900)
MCH: 29.6 pg (ref 27.0–33.0)
MCHC: 33.3 g/dL (ref 32.0–36.0)
MCV: 88.8 fL (ref 80.0–100.0)
MPV: 10.3 fL (ref 7.5–12.5)
Monocytes Absolute: 656 cells/uL (ref 200–950)
Monocytes Relative: 16 %
Neutro Abs: 1517 cells/uL (ref 1500–7800)
Neutrophils Relative %: 37 %
Platelets: 192 10*3/uL (ref 140–400)
RBC: 4.9 MIL/uL (ref 4.20–5.80)
RDW: 14.5 % (ref 11.0–15.0)
WBC: 4.1 10*3/uL (ref 3.8–10.8)

## 2016-08-22 LAB — COMPLETE METABOLIC PANEL WITH GFR
ALT: 8 U/L — ABNORMAL LOW (ref 9–46)
AST: 18 U/L (ref 10–35)
Albumin: 4.5 g/dL (ref 3.6–5.1)
Alkaline Phosphatase: 50 U/L (ref 40–115)
BUN: 18 mg/dL (ref 7–25)
CO2: 28 mmol/L (ref 20–31)
Calcium: 9.5 mg/dL (ref 8.6–10.3)
Chloride: 107 mmol/L (ref 98–110)
Creat: 1.24 mg/dL (ref 0.70–1.33)
GFR, Est African American: 76 mL/min (ref >=60)
GFR, Est Non African American: 65 mL/min (ref >=60)
Glucose, Bld: 100 mg/dL — ABNORMAL HIGH (ref 65–99)
Potassium: 4.3 mmol/L (ref 3.5–5.3)
Sodium: 141 mmol/L (ref 135–146)
Total Bilirubin: 0.5 mg/dL (ref 0.2–1.2)
Total Protein: 7.2 g/dL (ref 6.1–8.1)

## 2016-08-22 NOTE — Progress Notes (Signed)
John Bullock was here today to screen for Reprieve, A Randomized Trial to Prevent Vascular Events in HIV (study drug is Pitavastatin 4mg  or placebo).   Informed consent was obtained after we reviewed the consent together thoroughly and answered all his questions. 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 He denies any current problems except for some postnasal drip. He says he has always been healthy except for being hospitalized for a ruptured appendix years ago. He smokes about 4 cigarettes/day. I showed him how much smoking affects his risks for heart disease. He is currently on Genvoya now and has been on Stribilid, Truvada, Viread, Combivir and Sustuva at different times in the past but doesn't remember how long. He was first diagnosed in [place] in [place]. He will return April 24th to see Dr. Linus Salmons and we will enroll him after that if he is eligible.

## 2016-08-23 ENCOUNTER — Other Ambulatory Visit: Payer: Medicare Other

## 2016-08-23 LAB — T-HELPER CELL (CD4) - (RCID CLINIC ONLY)
CD4 % Helper T Cell: 32 % — ABNORMAL LOW (ref 33–55)
CD4 T Cell Abs: 730 /uL (ref 400–2700)

## 2016-08-23 LAB — RPR

## 2016-08-24 LAB — HIV-1 RNA QUANT-NO REFLEX-BLD
HIV 1 RNA Quant: <20 copies/mL
HIV-1 RNA Quant, Log: <1.3 Log copies/mL

## 2016-09-06 ENCOUNTER — Encounter: Payer: Self-pay | Admitting: Internal Medicine

## 2016-09-06 ENCOUNTER — Ambulatory Visit (INDEPENDENT_AMBULATORY_CARE_PROVIDER_SITE_OTHER): Payer: Medicare Other | Admitting: Internal Medicine

## 2016-09-06 ENCOUNTER — Other Ambulatory Visit: Payer: Self-pay | Admitting: *Deleted

## 2016-09-06 ENCOUNTER — Encounter (INDEPENDENT_AMBULATORY_CARE_PROVIDER_SITE_OTHER): Payer: Self-pay | Admitting: *Deleted

## 2016-09-06 VITALS — BP 116/72 | HR 69 | Temp 98.1°F | Ht 72.0 in | Wt 215.0 lb

## 2016-09-06 DIAGNOSIS — Z006 Encounter for examination for normal comparison and control in clinical research program: Secondary | ICD-10-CM

## 2016-09-06 DIAGNOSIS — Z720 Tobacco use: Secondary | ICD-10-CM | POA: Diagnosis not present

## 2016-09-06 DIAGNOSIS — J069 Acute upper respiratory infection, unspecified: Secondary | ICD-10-CM

## 2016-09-06 DIAGNOSIS — B20 Human immunodeficiency virus [HIV] disease: Secondary | ICD-10-CM

## 2016-09-06 MED ORDER — ELVITEG-COBIC-EMTRICIT-TENOFAF 150-150-200-10 MG PO TABS: 1.0000 | ORAL_TABLET | Freq: Every day | ORAL | 5 refills | Status: DC

## 2016-09-06 MED ORDER — CETIRIZINE HCL 10 MG PO TABS: 10.0000 mg | ORAL_TABLET | Freq: Every day | ORAL | 5 refills | Status: DC

## 2016-09-06 MED ORDER — PITAVASTATIN CALCIUM 4 MG PO TABS: 4.0000 mg | ORAL_TABLET | Freq: Every day | ORAL | 11 refills | Status: AC

## 2016-09-06 NOTE — Progress Notes (Signed)
John Bullock enrolled in the Reprieve, A Randomized Trial to Prevent Vascular Events in HIV (study drug is Pitavastatin 4mg  or placebo) after eligibility was verified. He was randomized to Pitavastatin 4mg  or placebo every day and he plans to start today. He was instructed on dosing, adherence and what side effects to watch out for. He will call us if he develops any significant problem with muscle soreness, etc. EKG was obtained which showed NSR. He will return in 1 month for followup.

## 2016-09-06 NOTE — Assessment & Plan Note (Signed)
Doing well.  suprressed.  rtc 6 months

## 2016-09-06 NOTE — Assessment & Plan Note (Signed)
Encouraged cessation.

## 2016-09-06 NOTE — Addendum Note (Signed)
Addended by: Bobbie Stack on: 09/06/2016 12:13 PM   Modules accepted: Orders

## 2016-09-06 NOTE — Progress Notes (Signed)
   Subjective:    Patient ID: John Bullock, male    DOB: 04-21-1961, 56 y.o.   MRN: [id number]  HPI Here for follow up of HIV  CD4 of 730, viral load < 20.  Normal creat.  No missed doses.  No new issues.  Is going to enter statin study.  No associated n/v/d.  No weight loss.  Down to 3 cigarettes daily and will attempt to quit completely.     Review of Systems  Constitutional: Negative for fatigue.  Gastrointestinal: Negative for diarrhea.  Skin: Negative for rash.  Neurological: Negative for dizziness.       Objective:   Physical Exam  Constitutional: He appears well-developed and well-nourished.  Eyes: No scleral icterus.  Cardiovascular: Normal rate, regular rhythm and normal heart sounds.   Pulmonary/Chest: Effort normal and breath sounds normal. No respiratory distress. He has no wheezes.  Skin: No rash noted.   SH: tobacco use as above but decreasing        Assessment & Plan:

## 2016-10-04 ENCOUNTER — Encounter: Payer: Self-pay | Admitting: *Deleted

## 2016-10-04 ENCOUNTER — Encounter (INDEPENDENT_AMBULATORY_CARE_PROVIDER_SITE_OTHER): Payer: Medicare Other | Admitting: *Deleted

## 2016-10-04 VITALS — BP 133/92 | HR 63 | Temp 97.7°F | Resp 12 | Wt 212.5 lb

## 2016-10-04 DIAGNOSIS — Z006 Encounter for examination for normal comparison and control in clinical research program: Secondary | ICD-10-CM

## 2016-10-04 LAB — COMPREHENSIVE METABOLIC PANEL
ALT: 9 U/L (ref 9–46)
AST: 22 U/L (ref 10–35)
Albumin: 4.4 g/dL (ref 3.6–5.1)
Alkaline Phosphatase: 53 U/L (ref 40–115)
BUN: 16 mg/dL (ref 7–25)
CO2: 23 mmol/L (ref 20–31)
Calcium: 8.7 mg/dL (ref 8.6–10.3)
Chloride: 108 mmol/L (ref 98–110)
Creat: 1.23 mg/dL (ref 0.70–1.33)
Glucose, Bld: 115 mg/dL — ABNORMAL HIGH (ref 65–99)
Potassium: 3.8 mmol/L (ref 3.5–5.3)
Sodium: 140 mmol/L (ref 135–146)
Total Bilirubin: 0.6 mg/dL (ref 0.2–1.2)
Total Protein: 6.8 g/dL (ref 6.1–8.1)

## 2016-10-04 NOTE — Progress Notes (Signed)
John Bullock is here for his month 1 visit for Reprieve, A Randomized Trial to Prevent Vascular Events in HIV (study drug is Pitavastatin 4mg  or placebo).  He denies any new problems or side effects from the new medication. He says his adherence is excellent. He will return in August for the month 4 visit.

## 2016-11-29 ENCOUNTER — Telehealth: Payer: Self-pay | Admitting: Lab

## 2016-11-29 NOTE — Telephone Encounter (Signed)
BILLING ISSUE-patient came in yesterday to dicuss why he was getting a bill.  He has Medicare and Medicaid.  I gave the information to Junie Panning today to resolve

## 2017-01-02 ENCOUNTER — Encounter (INDEPENDENT_AMBULATORY_CARE_PROVIDER_SITE_OTHER): Payer: Medicare Other | Admitting: *Deleted

## 2017-01-02 VITALS — BP 111/69 | HR 66 | Temp 97.8°F | Wt 218.2 lb

## 2017-01-02 DIAGNOSIS — Z006 Encounter for examination for normal comparison and control in clinical research program: Secondary | ICD-10-CM

## 2017-01-02 NOTE — Progress Notes (Signed)
John Bullock is here for month 4 Reprieve visit, A Randomized Trial to Prevent Vascular Events in HIV. No new complaints or concerns verbalized. Denied any muscle aches or weakness. States that he has been very adherent with his study medication. Will return in December for his next study visit.

## 2017-02-01 ENCOUNTER — Ambulatory Visit (INDEPENDENT_AMBULATORY_CARE_PROVIDER_SITE_OTHER): Payer: Medicare Other | Admitting: *Deleted

## 2017-02-01 DIAGNOSIS — Z23 Encounter for immunization: Secondary | ICD-10-CM | POA: Diagnosis present

## 2017-02-28 ENCOUNTER — Encounter: Payer: Self-pay | Admitting: Internal Medicine

## 2017-02-28 ENCOUNTER — Ambulatory Visit (INDEPENDENT_AMBULATORY_CARE_PROVIDER_SITE_OTHER): Payer: Medicare Other | Admitting: Internal Medicine

## 2017-02-28 ENCOUNTER — Ambulatory Visit: Payer: Medicare Other | Admitting: Internal Medicine

## 2017-02-28 VITALS — BP 109/74 | HR 69 | Temp 98.2°F | Wt 224.0 lb

## 2017-02-28 DIAGNOSIS — G479 Sleep disorder, unspecified: Secondary | ICD-10-CM | POA: Diagnosis not present

## 2017-02-28 DIAGNOSIS — Z113 Encounter for screening for infections with a predominantly sexual mode of transmission: Secondary | ICD-10-CM | POA: Diagnosis not present

## 2017-02-28 DIAGNOSIS — Z720 Tobacco use: Secondary | ICD-10-CM

## 2017-02-28 DIAGNOSIS — B20 Human immunodeficiency virus [HIV] disease: Secondary | ICD-10-CM

## 2017-02-28 NOTE — Assessment & Plan Note (Signed)
Doing well.  Labs today and rtc 6 months.  

## 2017-02-28 NOTE — Assessment & Plan Note (Signed)
I discussed sleep hygiene.  Reduce caffeine, no naps.

## 2017-02-28 NOTE — Assessment & Plan Note (Signed)
Given info on the [place] quit line

## 2017-02-28 NOTE — Progress Notes (Signed)
   Subjective:    Patient ID: John Bullock, male    DOB: 14-Apr-1961, 56 y.o.   MRN: [id number]  HPI Here for follow up of HIV  CD4 of 730, viral load < 20 last visit.  Normal creat.  No missed doses. Issues with sleep, sleeps during the day.  Currently in the Reprieve study. No associated n/v/d.  No weight loss or rashes.  Feels his peripheral neuropathy has worsened some. Still smokes but trying to quit.    Review of Systems  Constitutional: Negative for fatigue.  Gastrointestinal: Negative for diarrhea.  Skin: Negative for rash.  Neurological: Negative for dizziness.       Objective:   Physical Exam  Constitutional: He appears well-developed and well-nourished.  Eyes: No scleral icterus.  Cardiovascular: Normal rate, regular rhythm and normal heart sounds.   Pulmonary/Chest: Effort normal and breath sounds normal. No respiratory distress. He has no wheezes.  Skin: No rash noted.   SH: tobacco use as above but decreasing        Assessment & Plan:

## 2017-03-01 LAB — T-HELPER CELL (CD4) - (RCID CLINIC ONLY)
CD4 % Helper T Cell: 35 % (ref 33–55)
CD4 T Cell Abs: 756 /uL (ref 400–2700)

## 2017-03-03 LAB — HIV-1 RNA QUANT-NO REFLEX-BLD
HIV 1 RNA Quant: 60 copies/mL — ABNORMAL HIGH
HIV-1 RNA Quant, Log: 1.78 Log copies/mL — ABNORMAL HIGH

## 2017-03-08 ENCOUNTER — Other Ambulatory Visit: Payer: Self-pay | Admitting: Internal Medicine

## 2017-03-08 DIAGNOSIS — J069 Acute upper respiratory infection, unspecified: Secondary | ICD-10-CM

## 2017-03-08 DIAGNOSIS — B20 Human immunodeficiency virus [HIV] disease: Secondary | ICD-10-CM

## 2017-04-25 ENCOUNTER — Encounter (INDEPENDENT_AMBULATORY_CARE_PROVIDER_SITE_OTHER): Payer: Medicare Other | Admitting: Licensed Clinical Social Worker

## 2017-04-25 VITALS — BP 147/85 | HR 78 | Temp 98.2°F | Wt 217.0 lb

## 2017-04-25 DIAGNOSIS — Z006 Encounter for examination for normal comparison and control in clinical research program: Secondary | ICD-10-CM

## 2017-04-25 NOTE — Progress Notes (Signed)
Honor is here for month 8 Reprieve study visit. He admits to excellent adherence with his study medication and denies any aches or pains. He will return to see [place] in April 2019. Patient received 6 month supply of study drug today from the pharmacy.

## 2017-07-05 ENCOUNTER — Encounter: Payer: Self-pay | Admitting: *Deleted

## 2017-07-05 ENCOUNTER — Encounter (HOSPITAL_COMMUNITY): Payer: Self-pay

## 2017-07-05 ENCOUNTER — Other Ambulatory Visit: Payer: Self-pay

## 2017-07-05 ENCOUNTER — Emergency Department (HOSPITAL_COMMUNITY): Payer: Medicare Other

## 2017-07-05 ENCOUNTER — Emergency Department (HOSPITAL_COMMUNITY)
Admission: EM | Admit: 2017-07-05 | Discharge: 2017-07-05 | Disposition: A | Discharge status: Home / Self Care | Payer: Medicare Other | Attending: Emergency Medicine | Admitting: Emergency Medicine

## 2017-07-05 DIAGNOSIS — R05 Cough: Secondary | ICD-10-CM | POA: Diagnosis present

## 2017-07-05 DIAGNOSIS — I10 Essential (primary) hypertension: Secondary | ICD-10-CM | POA: Insufficient documentation

## 2017-07-05 DIAGNOSIS — R509 Fever, unspecified: Secondary | ICD-10-CM | POA: Diagnosis not present

## 2017-07-05 DIAGNOSIS — B20 Human immunodeficiency virus [HIV] disease: Secondary | ICD-10-CM | POA: Insufficient documentation

## 2017-07-05 DIAGNOSIS — R0602 Shortness of breath: Secondary | ICD-10-CM | POA: Diagnosis not present

## 2017-07-05 DIAGNOSIS — Z79899 Other long term (current) drug therapy: Secondary | ICD-10-CM | POA: Insufficient documentation

## 2017-07-05 DIAGNOSIS — F17210 Nicotine dependence, cigarettes, uncomplicated: Secondary | ICD-10-CM | POA: Diagnosis not present

## 2017-07-05 DIAGNOSIS — I499 Cardiac arrhythmia, unspecified: Secondary | ICD-10-CM | POA: Diagnosis not present

## 2017-07-05 DIAGNOSIS — J101 Influenza due to other identified influenza virus with other respiratory manifestations: Secondary | ICD-10-CM | POA: Diagnosis not present

## 2017-07-05 LAB — CBC
HCT: 42.2 % (ref 39.0–52.0)
Hemoglobin: 14 g/dL (ref 13.0–17.0)
MCH: 29.9 pg (ref 26.0–34.0)
MCHC: 33.2 g/dL (ref 30.0–36.0)
MCV: 90 fL (ref 78.0–100.0)
Platelets: 151 10*3/uL (ref 150–400)
RBC: 4.69 MIL/uL (ref 4.22–5.81)
RDW: 13.9 % (ref 11.5–15.5)
WBC: 5.6 10*3/uL (ref 4.0–10.5)

## 2017-07-05 LAB — BASIC METABOLIC PANEL
Anion gap: 11 (ref 5–15)
BUN: 15 mg/dL (ref 6–20)
CO2: 21 mmol/L — ABNORMAL LOW (ref 22–32)
Calcium: 9 mg/dL (ref 8.9–10.3)
Chloride: 109 mmol/L (ref 101–111)
Creatinine, Ser: 1.16 mg/dL (ref 0.61–1.24)
GFR calc Af Amer: >60 mL/min (ref >60)
GFR calc non Af Amer: >60 mL/min (ref >60)
Glucose, Bld: 110 mg/dL — ABNORMAL HIGH (ref 65–99)
Potassium: 4 mmol/L (ref 3.5–5.1)
Sodium: 141 mmol/L (ref 135–145)

## 2017-07-05 LAB — INFLUENZA PANEL BY PCR (TYPE A & B)
Influenza A By PCR: POSITIVE — AB
Influenza B By PCR: NEGATIVE

## 2017-07-05 LAB — I-STAT TROPONIN, ED: Troponin i, poc: 0 ng/mL (ref 0.00–0.08)

## 2017-07-05 MED ORDER — OSELTAMIVIR PHOSPHATE 75 MG PO CAPS: 75.0000 mg | ORAL_CAPSULE | Freq: Two times a day (BID) | ORAL | 0 refills | Status: DC

## 2017-07-05 MED ORDER — SODIUM CHLORIDE 0.9 % IV BOLUS (SEPSIS)
1000.0000 mL | Freq: Once | INTRAVENOUS | Status: AC
  Administered 2017-07-05: 1000 mL via INTRAVENOUS

## 2017-07-05 MED ORDER — KETOROLAC TROMETHAMINE 30 MG/ML IJ SOLN
30.0000 mg | Freq: Once | INTRAMUSCULAR | Status: AC
  Administered 2017-07-05: 30 mg via INTRAVENOUS
  Filled 2017-07-05: qty 1

## 2017-07-05 MED ORDER — BENZONATATE 100 MG PO CAPS: 100.0000 mg | ORAL_CAPSULE | Freq: Three times a day (TID) | ORAL | 0 refills | Status: DC

## 2017-07-05 MED ORDER — IPRATROPIUM-ALBUTEROL 0.5-2.5 (3) MG/3ML IN SOLN
3.0000 mL | Freq: Once | RESPIRATORY_TRACT | Status: AC
  Administered 2017-07-05: 3 mL via RESPIRATORY_TRACT
  Filled 2017-07-05: qty 3

## 2017-07-05 MED ORDER — OSELTAMIVIR PHOSPHATE 75 MG PO CAPS
75.0000 mg | ORAL_CAPSULE | Freq: Once | ORAL | Status: AC
  Administered 2017-07-05: 75 mg via ORAL
  Filled 2017-07-05: qty 1

## 2017-07-05 MED ORDER — ONDANSETRON HCL 4 MG/2ML IJ SOLN
4.0000 mg | Freq: Once | INTRAMUSCULAR | Status: AC
  Administered 2017-07-05: 4 mg via INTRAVENOUS
  Filled 2017-07-05: qty 2

## 2017-07-05 MED ORDER — ACETAMINOPHEN 325 MG PO TABS
650.0000 mg | ORAL_TABLET | Freq: Once | ORAL | Status: AC
  Administered 2017-07-05: 650 mg via ORAL
  Filled 2017-07-05: qty 2

## 2017-07-05 MED ORDER — HYDROCOD POLST-CPM POLST ER 10-8 MG/5ML PO SUER
5.0000 mL | Freq: Once | ORAL | Status: AC
  Administered 2017-07-05: 5 mL via ORAL
  Filled 2017-07-05: qty 5

## 2017-07-05 MED ORDER — SODIUM CHLORIDE 0.9 % IV BOLUS (SEPSIS): 1000.0000 mL | Freq: Once | INTRAVENOUS | Status: DC

## 2017-07-05 NOTE — ED Triage Notes (Signed)
Pt arrives to Ed via POV with c/o cough,chest pain and body aches that started on yesterday; Pt a&ox 4 on arrival. Pt states he is unable to sleep or keep anything down. Pt states pain at 9/10 on arrival.-Monique,RN

## 2017-07-05 NOTE — Discharge Instructions (Signed)
Your flu test is positive. Please take tamiflu as prescribed. Tessalon for cough. Tylenol or motrin for fever and body aches/headache. Drink plenty of fluids. Follow up with family doctor in 3 days. Return if worsening.

## 2017-07-05 NOTE — ED Provider Notes (Signed)
[facility] EMERGENCY DEPARTMENT Provider Note   CSN: [id number] Arrival date & time: 07/05/17  8295     History   Chief Complaint Chief Complaint  Patient presents with  . Cough  . Chest Pain  . Generalized Body Aches    HPI John Bullock is a 57 y.o. male.  HPI John Bullock is a 57 y.o. male with hx of HIV, well controlled, HTN, presents to ED with complaint of cough, congestion, subjective fever, chills, nausea, vomiting, diarrhea. Pt states his symptoms started yesterday. States did not take any medications prior to coming in. No sick contacts. No recent travel. No other complaints.   Past Medical History:  Diagnosis Date  . HIV infection ([place])   . HTN (hypertension) 09/11/2014  . Migraine     Patient Active Problem List   Diagnosis Date Noted  . Screening examination for venereal disease 02/28/2017  . Obesity (BMI 30.0-34.9) 07/24/2015  . Peripheral neuropathy 01/13/2015  . Migraines, neuralgic 12/20/2013  . Sleep difficulties 04/23/2013  . Tobacco abuse 10/04/2012  . HIV disease ([place]) 12/15/2010    Past Surgical History:  Procedure Laterality Date  . APPENDECTOMY  1990       Home Medications    Prior to Admission medications   Medication Sig Start Date End Date Taking? Authorizing Provider  albuterol (PROVENTIL HFA;VENTOLIN HFA) 108 (90 Base) MCG/ACT inhaler Inhale 2 puffs into the lungs every 4 (four) hours as needed for wheezing or shortness of breath. Patient not taking: Reported on 02/28/2017 05/12/15   Janne Napoleon, NP  cetirizine (ZYRTEC) 10 MG tablet Take 1 tablet (10 mg total) by mouth daily. 03/08/17   Thayer Headings, MD  GENVOYA 150-150-200-10 MG TABS tablet Take 1 tablet by mouth daily with breakfast. 03/08/17   Comer, Okey Regal, MD  Pitavastatin Calcium 4 MG TABS Take 1 tablet (4 mg total) by mouth daily. This may be placebo and is study provided. Do not fill prescription. 09/06/16   Truman Hayward, MD  sodium  chloride (OCEAN NASAL SPRAY) 0.65 % nasal spray Place 1 spray into the nose as needed for congestion. Patient not taking: Reported on 02/28/2017 02/22/16   Alphonzo Grieve, MD    Family History Family History  Problem Relation Age of Onset  . Brain cancer Mother   . Cancer Mother   . Stroke Mother   . Testicular cancer Father   . Cancer Father   . Stroke Father   . Hypertension Father   . Diabetes Father     Social History Social History   Tobacco Use  . Smoking status: Current Every Day Smoker    Packs/day: 0.50    Years: 30.00    Pack years: 15.00    Types: Cigarettes  . Smokeless tobacco: Never Used  . Tobacco comment: trying to quit.  Substance Use Topics  . Alcohol use: Yes    Alcohol/week: 2.4 oz    Types: 4 Shots of liquor per week    Comment: on Thursdays with [employer] game  . Drug use: No     Allergies   Nicotine step [nicotine]   Review of Systems Review of Systems  Constitutional: Positive for chills and fever.  HENT: Positive for congestion and sore throat.   Respiratory: Positive for cough. Negative for chest tightness and shortness of breath.   Cardiovascular: Negative for chest pain, palpitations and leg swelling.  Gastrointestinal: Positive for diarrhea, nausea and vomiting. Negative for abdominal distention and abdominal pain.  Genitourinary: Negative for dysuria, frequency, hematuria and urgency.  Musculoskeletal: Negative for arthralgias, myalgias, neck pain and neck stiffness.  Skin: Negative for rash.  Allergic/Immunologic: Positive for immunocompromised state.  Neurological: Positive for headaches. Negative for dizziness, weakness, light-headedness and numbness.  All other systems reviewed and are negative.    Physical Exam Updated Vital Signs BP 120/66   Pulse 69   Temp 98.5 F (36.9 C) (Oral)   Resp (!) 24   Ht 6\' 1"  (1.854 m)   Wt 101.6 kg (224 lb)   SpO2 100%   BMI 29.55 kg/m   Physical Exam  Constitutional: He is oriented  to person, place, and time. He appears well-developed and well-nourished. No distress.  HENT:  Head: Normocephalic and atraumatic.  Right Ear: External ear normal.  Left Ear: External ear normal.  Mouth/Throat: Oropharynx is clear and moist.  Clear rhinorrhea, pharynx erythemous, uvula midline  Eyes: Conjunctivae are normal.  Neck: Normal range of motion. Neck supple.  No meningeal signs  Cardiovascular: Normal rate, regular rhythm and normal heart sounds.  Pulmonary/Chest: Effort normal. No respiratory distress. He has wheezes. He has no rales.  Abdominal: Soft. Bowel sounds are normal. He exhibits no distension. There is no tenderness. There is no rebound.  Musculoskeletal: He exhibits no edema or tenderness.  Lymphadenopathy:    He has no cervical adenopathy.  Neurological: He is alert and oriented to person, place, and time.  Skin: Skin is warm and dry. No erythema.  Psychiatric: He has a normal mood and affect.  Nursing note and vitals reviewed.    ED Treatments / Results  Labs (all labs ordered are listed, but only abnormal results are displayed) Labs Reviewed  BASIC METABOLIC PANEL - Abnormal; Notable for the following components:      Result Value   CO2 21 (*)    Glucose, Bld 110 (*)    All other components within normal limits  CBC  INFLUENZA PANEL BY PCR (TYPE A & B)  I-STAT TROPONIN, ED    EKG  EKG Interpretation  Date/Time:  Wednesday July 05 2017 06:01:12 EST Ventricular Rate:  72 PR Interval:  162 QRS Duration: 82 QT Interval:  372 QTC Calculation: 407 R Axis:   60 Text Interpretation:  Normal sinus rhythm with sinus arrhythmia Normal ECG No significant change since last tracing Confirmed by Wandra Arthurs [id number]) on 07/05/2017 10:38:22 AM       Radiology Dg Chest 2 View  Result Date: 07/05/2017 CLINICAL DATA:  Cough and shortness of breath for 2 days. Chest pain. EXAM: CHEST  2 VIEW COMPARISON:  12/20/2013 FINDINGS: Mild patient rotation.The  cardiomediastinal contours are normal. The lungs are clear. Pulmonary vasculature is normal. No consolidation, pleural effusion, or pneumothorax. No acute osseous abnormalities are seen. IMPRESSION: No acute abnormality. Electronically Signed   By: Jeb Levering M.D.   On: 07/05/2017 06:31    Procedures Procedures (including critical care time)  Medications Ordered in ED Medications  ondansetron (ZOFRAN) injection 4 mg (not administered)  sodium chloride 0.9 % bolus 1,000 mL (not administered)  ketorolac (TORADOL) 30 MG/ML injection 30 mg (not administered)  acetaminophen (TYLENOL) tablet 650 mg (not administered)     Initial Impression / Assessment and Plan / ED Course  I have reviewed the triage vital signs and the nursing notes.  Pertinent labs & imaging results that were available during my care of the patient were reviewed by me and considered in my medical decision making (see chart for details).  Pt with flulike symptoms.  He is immunocompromised, history of HIV, well controlled on medications.  Last CD4 count in October was greater than 700.  His vital signs are normal.  He is in no acute distress.  Mild wheezing heard on exam, otherwise unremarkable examination.  He states he is unable to keep anything down, will start IV fluids, Zofran, Tylenol and Toradol, will check labs and chest x-ray.  10:46 AM Influenza a positive.  Vital signs remained normal.  He is able to eat and drink in the department.  He feels better.  Chest x-ray negative.  Not hypoxic, tachycardic, hypotensive.  First dose of Tamiflu given in emergency department.  Will discharge home with Tamiflu, continue Tylenol or Motrin for fever and headache, will give prescription for Tessalon for cough, instructed to follow-up with family doctor in 2-3 days.  Return precautions discussed.  Patient is comfortable with the plan.  Vitals:   07/05/17 0645 07/05/17 0700 07/05/17 0730 07/05/17 0745  BP: 119/65 120/66  119/70 117/60  Pulse: 66 69 72 70  Resp: (!) 26 (!) 24 (!) 24 (!) 22  Temp:      TempSrc:      SpO2: 100% 100% 95% 94%  Weight:      Height:         Final Clinical Impressions(s) / ED Diagnoses   Final diagnoses:  Influenza A    ED Discharge Orders        Ordered    oseltamivir (TAMIFLU) 75 MG capsule  Every 12 hours     07/05/17 1048    benzonatate (TESSALON) 100 MG capsule  Every 8 hours     07/05/17 1048       Marc Morgans [place], PA-C 07/05/17 1049    Drenda Freeze, MD 07/05/17 Drema Halon

## 2017-08-18 DIAGNOSIS — Z5181 Encounter for therapeutic drug level monitoring: Secondary | ICD-10-CM | POA: Diagnosis not present

## 2017-08-22 DIAGNOSIS — Z5181 Encounter for therapeutic drug level monitoring: Secondary | ICD-10-CM | POA: Diagnosis not present

## 2017-08-24 ENCOUNTER — Encounter (INDEPENDENT_AMBULATORY_CARE_PROVIDER_SITE_OTHER): Payer: Medicare Other | Admitting: *Deleted

## 2017-08-24 ENCOUNTER — Encounter: Payer: Medicare Other | Admitting: *Deleted

## 2017-08-24 VITALS — BP 112/75 | HR 62 | Temp 97.9°F | Wt 204.0 lb

## 2017-08-24 DIAGNOSIS — Z006 Encounter for examination for normal comparison and control in clinical research program: Secondary | ICD-10-CM | POA: Diagnosis not present

## 2017-08-24 LAB — COMPREHENSIVE METABOLIC PANEL
AG Ratio: 2.1 (calc) (ref 1.0–2.5)
ALT: 7 U/L — ABNORMAL LOW (ref 9–46)
AST: 19 U/L (ref 10–35)
Albumin: 4.8 g/dL (ref 3.6–5.1)
Alkaline phosphatase (APISO): 58 U/L (ref 40–115)
BUN: 16 mg/dL (ref 7–25)
CO2: 26 mmol/L (ref 20–32)
Calcium: 9.4 mg/dL (ref 8.6–10.3)
Chloride: 109 mmol/L (ref 98–110)
Creat: 1.19 mg/dL (ref 0.70–1.33)
Globulin: 2.3 g/dL (calc) (ref 1.9–3.7)
Glucose, Bld: 98 mg/dL (ref 65–99)
Potassium: 4.3 mmol/L (ref 3.5–5.3)
Sodium: 140 mmol/L (ref 135–146)
Total Bilirubin: 0.5 mg/dL (ref 0.2–1.2)
Total Protein: 7.1 g/dL (ref 6.1–8.1)

## 2017-08-24 NOTE — Progress Notes (Signed)
John Bullock is here for his month 12 visit for [employer]. He denies any current problems or concerns about taking his study medications. I did review the prohibited med list with him and reminded him to let any provider know that he is on a study drug. He did have the flu in February and was on Tamiflu. He will be returning in August for the next study visit.

## 2017-09-16 ENCOUNTER — Other Ambulatory Visit: Payer: Self-pay | Admitting: Internal Medicine

## 2017-09-16 DIAGNOSIS — J069 Acute upper respiratory infection, unspecified: Secondary | ICD-10-CM

## 2017-09-16 DIAGNOSIS — B20 Human immunodeficiency virus [HIV] disease: Secondary | ICD-10-CM

## 2017-10-11 ENCOUNTER — Ambulatory Visit (INDEPENDENT_AMBULATORY_CARE_PROVIDER_SITE_OTHER): Payer: Medicare Other | Admitting: Internal Medicine

## 2017-10-11 ENCOUNTER — Other Ambulatory Visit: Payer: Self-pay

## 2017-10-11 ENCOUNTER — Telehealth: Payer: Self-pay | Admitting: *Deleted

## 2017-10-11 VITALS — BP 98/67 | HR 75 | Temp 98.7°F | Ht 72.0 in | Wt 208.2 lb

## 2017-10-11 DIAGNOSIS — B20 Human immunodeficiency virus [HIV] disease: Secondary | ICD-10-CM | POA: Diagnosis not present

## 2017-10-11 DIAGNOSIS — R0789 Other chest pain: Secondary | ICD-10-CM

## 2017-10-11 MED ORDER — ALBUTEROL SULFATE HFA 108 (90 BASE) MCG/ACT IN AERS: 2.0000 | INHALATION_SPRAY | RESPIRATORY_TRACT | 0 refills | Status: DC | PRN

## 2017-10-11 MED ORDER — NAPROXEN 375 MG PO TBEC: 375.0000 mg | DELAYED_RELEASE_TABLET | Freq: Two times a day (BID) | ORAL | 0 refills | Status: DC

## 2017-10-11 NOTE — Telephone Encounter (Signed)
Walk-in. Presented to the office- c/o "chest pain" x 1 week. After talking with pt , pain is located right side, below breast area. No c/o pain at this time, pt stated mainly when he's lying down; described as a "pulling" when he raised his right arm. No c/o pain on left side/arm. Appt scheduled in [place] @ 2:45 PM. Pt instructed if he starts having any sharp pain esp on left side, chest pain/ SOB to go to the ED - pt voiced understanding.

## 2017-10-11 NOTE — Telephone Encounter (Signed)
Ok thank you 

## 2017-10-11 NOTE — Patient Instructions (Addendum)
I believe you have a muscle strain injury of the chest wall muscles. This will probably get better on its own in about 4 weeks but in the meantime you can use antiinflammatory medicine such as naproxen. Take 375mg  twice daily with meals for 1 week, then just as needed.  Try and rest this area for the next 1-2 weeks then you should work on stretching it back to normal flexibility.  If this is getting worse or no better after a month we may need to get an xray to make sure there is no other problem at work.

## 2017-10-11 NOTE — Progress Notes (Signed)
   CC: Wants to resume care at [facility] with evaluation for right sided chest pain  HPI:  John Bullock is a 57 y.o. male with PMHx detailed below presenting to reestablish medical care with our clinic and also be evaluated for right-sided chest wall pain.  He has been suffering significant pain on the right side since about 1 week ago.  This is exacerbated by reaching overhead and lying on his right side.  He does not recall any specific physical activity or injury associated with this.  He does not have any pain on deep inspiration or with positional change.  There is no associated shortness of breath and the pain does not radiate anywhere.  He was previously receiving some primary care needs through [facility] for over a year while not seeing a medicine PCP.  See problem based assessment and plan below for additional details.  Chest wall pain Mr. John Bullock history and exam suggest intercostal muscle strain.  The reproducibility to direct palpation and shoulder movement would not be explained by a intrathoracic process. There is no trauma or severe acute onset to suggest a rib fracture needing imaging.  He has no tachycardia, pleurisy, hypoxia and his pulmonary exam is completely normal so a process involving the pulmonary vasculature or lung parenchyma is unlikely. Plan: Recommend naproxen 375 mg twice daily for the next 1 to 2 weeks Heat or ice as needed for symptom relief Encouraged resting as much as possible for the next 1 week then getting back to passive or active range of motion stretching   Past Medical History:  Diagnosis Date  . HIV infection ([place])   . HTN (hypertension) 09/11/2014  . Migraine     Review of Systems: Review of Systems  Constitutional: Negative for chills and fever.  Respiratory: Negative for cough and sputum production.   Cardiovascular: Positive for chest pain. Negative for palpitations.  Gastrointestinal: Negative for abdominal pain.  Musculoskeletal: Negative  for myalgias.  Skin: Negative for rash.     Physical Exam: Vitals:   10/11/17 1439  BP: 98/67  Pulse: 75  Temp: 98.7 F (37.1 C)  TempSrc: Oral  Weight: 208 lb 3.2 oz (94.4 kg)  Height: 6' (1.829 m)   GENERAL- alert, co-operative, NAD HEENT- Atraumatic, oral mucosa appears moist, good and intact dentition, no cervical lymphadenopathy CARDIAC- RRR, no murmurs, rubs or gallops. RESP- CTAB, no wheezes or crackles. CHEST WALL-region of tenderness to palpation from around the mid axillary line around T5-T6, there is no overlying skin change or palpable abnormality present, pain is reproduced with shoulder abduction overhead, there is no pain on deep inspiration ABDOMEN- Soft, nontender BACK- Normal curvature, no paraspinal tenderness EXTREMITIES- symmetric, no pedal edema. SKIN- Warm, dry, No rash or lesion.   Assessment & Plan:   See encounters tab for problem based medical decision making.   Patient discussed with Dr. Angelia Mould

## 2017-10-16 NOTE — Progress Notes (Signed)
Internal Medicine Clinic Attending  Case discussed with Dr. Rice at the time of the visit.  We reviewed the resident's history and exam and pertinent patient test results.  I agree with the assessment, diagnosis, and plan of care documented in the resident's note.  

## 2017-10-16 NOTE — Assessment & Plan Note (Signed)
John Bullock history and exam suggest intercostal muscle strain.  The reproducibility to direct palpation and shoulder movement would not be explained by a intrathoracic process. There is no trauma or severe acute onset to suggest a rib fracture needing imaging.  He has no tachycardia, pleurisy, hypoxia and his pulmonary exam is completely normal so a process involving the pulmonary vasculature or lung parenchyma is unlikely. Plan: Recommend naproxen 375 mg twice daily for the next 1 to 2 weeks Heat or ice as needed for symptom relief Encouraged resting as much as possible for the next 1 week then getting back to passive or active range of motion stretching

## 2017-10-18 ENCOUNTER — Other Ambulatory Visit (HOSPITAL_COMMUNITY)
Admission: RE | Admit: 2017-10-18 | Discharge: 2017-10-18 | Disposition: A | Discharge status: Home / Self Care | Payer: Medicare Other | Source: Ambulatory Visit | Attending: Internal Medicine | Admitting: Internal Medicine

## 2017-10-18 ENCOUNTER — Other Ambulatory Visit: Payer: Self-pay | Admitting: Internal Medicine

## 2017-10-18 ENCOUNTER — Other Ambulatory Visit: Payer: Medicare Other

## 2017-10-18 DIAGNOSIS — B20 Human immunodeficiency virus [HIV] disease: Secondary | ICD-10-CM | POA: Diagnosis not present

## 2017-10-18 DIAGNOSIS — Z113 Encounter for screening for infections with a predominantly sexual mode of transmission: Secondary | ICD-10-CM | POA: Insufficient documentation

## 2017-10-19 LAB — COMPLETE METABOLIC PANEL WITH GFR
AG Ratio: 2 (calc) (ref 1.0–2.5)
ALT: 8 U/L — ABNORMAL LOW (ref 9–46)
AST: 19 U/L (ref 10–35)
Albumin: 4.4 g/dL (ref 3.6–5.1)
Alkaline phosphatase (APISO): 69 U/L (ref 40–115)
BUN: 15 mg/dL (ref 7–25)
CO2: 26 mmol/L (ref 20–32)
Calcium: 9 mg/dL (ref 8.6–10.3)
Chloride: 108 mmol/L (ref 98–110)
Creat: 1.09 mg/dL (ref 0.70–1.33)
GFR, Est African American: 87 mL/min/{1.73_m2} (ref >=60)
GFR, Est Non African American: 75 mL/min/{1.73_m2} (ref >=60)
Globulin: 2.2 g/dL (calc) (ref 1.9–3.7)
Glucose, Bld: 81 mg/dL (ref 65–99)
Potassium: 4 mmol/L (ref 3.5–5.3)
Sodium: 141 mmol/L (ref 135–146)
Total Bilirubin: 0.3 mg/dL (ref 0.2–1.2)
Total Protein: 6.6 g/dL (ref 6.1–8.1)

## 2017-10-19 LAB — CBC WITH DIFFERENTIAL/PLATELET
Basophils Absolute: 18 cells/uL (ref 0–200)
Basophils Relative: 0.4 %
Eosinophils Absolute: 50 cells/uL (ref 15–500)
Eosinophils Relative: 1.1 %
HCT: 38.5 % (ref 38.5–50.0)
Hemoglobin: 13.1 g/dL — ABNORMAL LOW (ref 13.2–17.1)
Lymphs Abs: 1953 cells/uL (ref 850–3900)
MCH: 29.7 pg (ref 27.0–33.0)
MCHC: 34 g/dL (ref 32.0–36.0)
MCV: 87.3 fL (ref 80.0–100.0)
MPV: 11 fL (ref 7.5–12.5)
Monocytes Relative: 12.7 %
Neutro Abs: 1908 cells/uL (ref 1500–7800)
Neutrophils Relative %: 42.4 %
Platelets: 180 10*3/uL (ref 140–400)
RBC: 4.41 10*6/uL (ref 4.20–5.80)
RDW: 13.8 % (ref 11.0–15.0)
Total Lymphocyte: 43.4 %
WBC mixed population: 572 cells/uL (ref 200–950)
WBC: 4.5 10*3/uL (ref 3.8–10.8)

## 2017-10-19 LAB — RPR: RPR Ser Ql: NONREACTIVE

## 2017-10-19 LAB — T-HELPER CELL (CD4) - (RCID CLINIC ONLY)
CD4 % Helper T Cell: 36 % (ref 33–55)
CD4 T Cell Abs: 750 /uL (ref 400–2700)

## 2017-10-19 LAB — URINE CYTOLOGY ANCILLARY ONLY
Chlamydia: NEGATIVE
Neisseria Gonorrhea: NEGATIVE

## 2017-10-20 LAB — HIV-1 RNA QUANT-NO REFLEX-BLD
HIV 1 RNA Quant: <20 copies/mL — AB
HIV-1 RNA Quant, Log: <1.3 Log copies/mL — AB

## 2017-10-26 ENCOUNTER — Telehealth: Payer: Self-pay

## 2017-10-26 ENCOUNTER — Ambulatory Visit: Payer: Medicare Other

## 2017-10-26 VITALS — BP 99/63 | HR 73

## 2017-10-26 DIAGNOSIS — B20 Human immunodeficiency virus [HIV] disease: Secondary | ICD-10-CM

## 2017-10-26 NOTE — Telephone Encounter (Signed)
Ok, thanks.

## 2017-10-26 NOTE — Telephone Encounter (Signed)
Pt came in today concerned about his bp. Was told by his PCP that he would like for the pt to monitor his bp until his next appt. Pt came into office with bp of 99/63 and a pulse of 73. Pt will keep a log of BP until his next appt with PCP. Would like to inform Dr. Linus Salmons of his bp. [place]

## 2017-11-01 ENCOUNTER — Encounter: Payer: Self-pay | Admitting: Internal Medicine

## 2017-11-01 ENCOUNTER — Ambulatory Visit (INDEPENDENT_AMBULATORY_CARE_PROVIDER_SITE_OTHER): Payer: Medicare Other | Admitting: Internal Medicine

## 2017-11-01 VITALS — BP 109/72 | HR 67 | Temp 97.6°F | Ht 72.0 in | Wt 204.0 lb

## 2017-11-01 DIAGNOSIS — Z113 Encounter for screening for infections with a predominantly sexual mode of transmission: Secondary | ICD-10-CM

## 2017-11-01 DIAGNOSIS — Z720 Tobacco use: Secondary | ICD-10-CM

## 2017-11-01 DIAGNOSIS — B20 Human immunodeficiency virus [HIV] disease: Secondary | ICD-10-CM

## 2017-11-01 NOTE — Assessment & Plan Note (Signed)
Screened negative.  Uses condoms with sex always

## 2017-11-01 NOTE — Progress Notes (Signed)
   Subjective:    Patient ID: John Bullock, male    DOB: February 19, 1961, 57 y.o.   MRN: [id number]  HPI Here for follow up of HIV  CD4 of 750, viral load < 20.  Normal creat.  No missed doses.  Continues in the [employer] study. No associated n/v/d.  No weight loss or rashes.  Still smokes but trying to quit.  Traveling to [place] in September for a wedding.  In the HPV study at [facility] with Dr. Janalyn Harder.   Review of Systems  Constitutional: Negative for fatigue.  Gastrointestinal: Negative for diarrhea.  Skin: Negative for rash.  Neurological: Negative for dizziness.       Objective:   Physical Exam  Constitutional: He appears well-developed and well-nourished.  Eyes: No scleral icterus.  Cardiovascular: Normal rate, regular rhythm and normal heart sounds.  Pulmonary/Chest: Effort normal and breath sounds normal. No respiratory distress. He has no wheezes.  Skin: No rash noted.   SH: tobacco use as above but decreasing        Assessment & Plan:

## 2017-11-01 NOTE — Assessment & Plan Note (Signed)
Counseled on quitting and still trying

## 2017-11-01 NOTE — Assessment & Plan Note (Signed)
Doing well.  No issues and can rtc in 1 year

## 2017-11-13 ENCOUNTER — Other Ambulatory Visit: Payer: Self-pay | Admitting: Internal Medicine

## 2017-11-13 DIAGNOSIS — R0789 Other chest pain: Secondary | ICD-10-CM

## 2017-11-13 NOTE — Telephone Encounter (Signed)
Refill X 1 and then needs to be seen again if problem persists.

## 2017-12-05 ENCOUNTER — Encounter: Payer: Self-pay | Admitting: Internal Medicine

## 2017-12-05 ENCOUNTER — Other Ambulatory Visit: Payer: Self-pay

## 2017-12-05 ENCOUNTER — Ambulatory Visit (INDEPENDENT_AMBULATORY_CARE_PROVIDER_SITE_OTHER): Payer: Medicare Other | Admitting: Internal Medicine

## 2017-12-05 VITALS — BP 109/69 | HR 56 | Temp 98.6°F | Ht 72.0 in | Wt 207.7 lb

## 2017-12-05 DIAGNOSIS — J302 Other seasonal allergic rhinitis: Secondary | ICD-10-CM | POA: Diagnosis not present

## 2017-12-05 DIAGNOSIS — Z21 Asymptomatic human immunodeficiency virus [HIV] infection status: Secondary | ICD-10-CM

## 2017-12-05 DIAGNOSIS — Z791 Long term (current) use of non-steroidal anti-inflammatories (NSAID): Secondary | ICD-10-CM

## 2017-12-05 DIAGNOSIS — G629 Polyneuropathy, unspecified: Secondary | ICD-10-CM | POA: Diagnosis not present

## 2017-12-05 DIAGNOSIS — Z598 Other problems related to housing and economic circumstances: Secondary | ICD-10-CM

## 2017-12-05 DIAGNOSIS — R0789 Other chest pain: Secondary | ICD-10-CM

## 2017-12-05 DIAGNOSIS — G6289 Other specified polyneuropathies: Secondary | ICD-10-CM

## 2017-12-05 DIAGNOSIS — Z720 Tobacco use: Secondary | ICD-10-CM | POA: Diagnosis not present

## 2017-12-05 DIAGNOSIS — R05 Cough: Secondary | ICD-10-CM | POA: Diagnosis not present

## 2017-12-05 DIAGNOSIS — R059 Cough, unspecified: Secondary | ICD-10-CM

## 2017-12-05 DIAGNOSIS — G43909 Migraine, unspecified, not intractable, without status migrainosus: Secondary | ICD-10-CM | POA: Diagnosis not present

## 2017-12-05 DIAGNOSIS — Z77120 Contact with and (suspected) exposure to mold (toxic): Secondary | ICD-10-CM

## 2017-12-05 DIAGNOSIS — Z8739 Personal history of other diseases of the musculoskeletal system and connective tissue: Secondary | ICD-10-CM | POA: Diagnosis not present

## 2017-12-05 DIAGNOSIS — Z79899 Other long term (current) drug therapy: Secondary | ICD-10-CM

## 2017-12-05 DIAGNOSIS — I10 Essential (primary) hypertension: Secondary | ICD-10-CM | POA: Diagnosis not present

## 2017-12-05 MED ORDER — PREGABALIN 50 MG PO CAPS: 50.0000 mg | ORAL_CAPSULE | Freq: Every day | ORAL | 0 refills | Status: DC

## 2017-12-05 MED ORDER — NAPROXEN 375 MG PO TBEC: DELAYED_RELEASE_TABLET | ORAL | 0 refills | Status: DC

## 2017-12-05 NOTE — Assessment & Plan Note (Addendum)
-   Was given albuterol inhaler with diagnosis of Asthma per patient but no record - Is currently using albuterol inhaler TID daily - Endorsing non-productive cough - Has 20 pack year smoking history although recently has reduced down to 2~3 cigarettes per day - States his bathroom has mold and is in the process of moving to a different apartment - History of seasonal allergies on Zyrtec  - Chronic cough 2/2 asthma vs COPD - Unable to find any recent pulmonary function test results - Patient would benefit from maintenance medication (low dose ICS for asthma vs LAMA for COPD) instead of using his albuterol inhaler everyday - Will order for PFTs and F/u with results - Encouraged pt to use Zyrtec everyday

## 2017-12-05 NOTE — Patient Instructions (Addendum)
John Bullock  You came to [place] with complaints of foot pain and cough. We have prescribed you some NSAIDs and pain medication for neuropathic pain. We also are putting in a referral for pulmonary function test so we can get a better idea of what your lung problem is. Thank you for visiting the clinic.  -Gilberto Better   Plantar Fasciitis Plantar fasciitis is a painful foot condition that affects the heel. It occurs when the band of tissue that connects the toes to the heel bone (plantar fascia) becomes irritated. This can happen after exercising too much or doing other repetitive activities (overuse injury). The pain from plantar fasciitis can range from mild irritation to severe pain that makes it difficult for you to walk or move. The pain is usually worse in the morning or after you have been sitting or lying down for a while. What are the causes? This condition may be caused by:  Standing for long periods of time.  Wearing shoes that do not fit.  Doing high-impact activities, including running, aerobics, and ballet.  Being overweight.  Having an abnormal way of walking (gait).  Having tight calf muscles.  Having high arches in your feet.  Starting a new athletic activity.  What are the signs or symptoms? The main symptom of this condition is heel pain. Other symptoms include:  Pain that gets worse after activity or exercise.  Pain that is worse in the morning or after resting.  Pain that goes away after you walk for a few minutes.  How is this diagnosed? This condition may be diagnosed based on your signs and symptoms. Your health care provider will also do a physical exam to check for:  A tender area on the bottom of your foot.  A high arch in your foot.  Pain when you move your foot.  Difficulty moving your foot.  You may also need to have imaging studies to confirm the diagnosis. These can include:  X-rays.  Ultrasound.  MRI.  How is this treated? Treatment for  plantar fasciitis depends on the severity of the condition. Your treatment may include:  Rest, ice, and over-the-counter pain medicines to manage your pain.  Exercises to stretch your calves and your plantar fascia.  A splint that holds your foot in a stretched, upward position while you sleep (night splint).  Physical therapy to relieve symptoms and prevent problems in the future.  Cortisone injections to relieve severe pain.  Extracorporeal shock wave therapy (ESWT) to stimulate damaged plantar fascia with electrical impulses. It is often used as a last resort before surgery.  Surgery, if other treatments have not worked after 12 months.  Follow these instructions at home:  Take medicines only as directed by your health care provider.  Avoid activities that cause pain.  Roll the bottom of your foot over a bag of ice or a bottle of cold water. Do this for 20 minutes, 3-4 times a day.  Perform simple stretches as directed by your health care provider.  Try wearing athletic shoes with air-sole or gel-sole cushions or soft shoe inserts.  Wear a night splint while sleeping, if directed by your health care provider.  Keep all follow-up appointments with your health care provider. How is this prevented?  Do not perform exercises or activities that cause heel pain.  Consider finding low-impact activities if you continue to have problems.  Lose weight if you need to. The best way to prevent plantar fasciitis is to avoid the activities that  aggravate your plantar fascia. Contact a health care provider if:  Your symptoms do not go away after treatment with home care measures.  Your pain gets worse.  Your pain affects your ability to move or do your daily activities. This information is not intended to replace advice given to you by your health care provider. Make sure you discuss any questions you have with your health care provider. Document Released: 01/25/2001 Document  Revised: 10/05/2015 Document Reviewed: 03/12/2014 Elsevier Interactive Patient Education  [employer].

## 2017-12-05 NOTE — Assessment & Plan Note (Addendum)
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

## 2017-12-05 NOTE — Progress Notes (Signed)
   CC: Foot pain, cough  HPI: [name] is a 57 y.o. M w/ PMH of HIV (CD4 750, undetectable viral load), HTN and chronic migraines who presents to the clinic with complaints of foot pain and cough. He states he has had foot pains diagnosed by his providers as neuropathic pain as side effects of his HIV meds 2 years ago and have had intermittent painful episodes ever since. He describes the pain as intermittent sore and shocking pain "like walking on jagged egg shells" starting near the bottom of his foot and radiating upward. He states pain is worse in the morning and on exertion when he's moving around. His pain is causing him difficulty in ambulation, especially when trying to walk up the stairs of his home. He had previously been prescribed gabapentin but he stopped taking it due to lack of relief. Patient denies any fever/chills/tenderness to palpation/edema or erythema.  He also has complaints of non-productive chronic cough. He states he needs to use his albuterol inhaler at least 3 times a day every day. He lives in section 8 housing and found mold in his bathroom, which the inspectors came and confirmed. He is the process of moving to a different apartment with his case manager and he needs a physician's letter to continue the process. He also states he has bad seasonal allergies, which he managed with zyrtec.  Past Medical History:  Diagnosis Date  . HIV infection ([place])   . HTN (hypertension) 09/11/2014  . Migraine    Review of Systems: Review of Systems  Constitutional: Negative for chills, fever, malaise/fatigue and weight loss.  Respiratory: Positive for cough. Negative for hemoptysis, sputum production, shortness of breath and wheezing.   Cardiovascular: Negative for chest pain, palpitations, orthopnea and claudication.  Musculoskeletal:       Pain on ambulation while on his feet  Neurological: Negative for dizziness, tingling, sensory change, focal weakness, weakness and  headaches.     Physical Exam: Vitals:   12/05/17 0830  BP: 109/69  Pulse: (!) 56  Temp: 98.6 F (37 C)  SpO2: 100%    Physical Exam  Constitutional: He is oriented to person, place, and time. He appears well-developed and well-nourished. No distress.  Cardiovascular: Normal rate, regular rhythm, normal heart sounds and intact distal pulses.  Respiratory: Effort normal. No respiratory distress. He has no wheezes. He has no rales. He exhibits no tenderness.  Distant breath sounds  Musculoskeletal: Normal range of motion. He exhibits tenderness. He exhibits no edema or deformity.  Tenderness on flexion and extension of bilateral feet. No edema, erythema, warmth of toe joints  Neurological: He is alert and oriented to person, place, and time. He has normal reflexes. Coordination normal.  Sensation intact bilateral lower extremities      Assessment & Plan:   See Encounters Tab for problem based charting.  Patient seen with Dr. Rebeca Alert   -Gilberto Better, PGY1

## 2017-12-06 ENCOUNTER — Encounter: Payer: Self-pay | Admitting: *Deleted

## 2017-12-08 NOTE — Progress Notes (Signed)
Internal Medicine Clinic Attending  I saw and evaluated the patient.  I personally confirmed the key portions of the history and exam documented by Dr. Lee and I reviewed pertinent patient test results.  The assessment, diagnosis, and plan were formulated together and I agree with the documentation in the resident's note.  Lajuane Leatham, M.D., Ph.D.  

## 2017-12-12 ENCOUNTER — Ambulatory Visit (HOSPITAL_COMMUNITY)
Admission: RE | Admit: 2017-12-12 | Discharge: 2017-12-12 | Disposition: A | Discharge status: Home / Self Care | Payer: Medicare Other | Source: Ambulatory Visit | Attending: Internal Medicine | Admitting: Internal Medicine

## 2017-12-12 DIAGNOSIS — R059 Cough, unspecified: Secondary | ICD-10-CM

## 2017-12-12 DIAGNOSIS — F17210 Nicotine dependence, cigarettes, uncomplicated: Secondary | ICD-10-CM | POA: Diagnosis not present

## 2017-12-12 DIAGNOSIS — R918 Other nonspecific abnormal finding of lung field: Secondary | ICD-10-CM | POA: Insufficient documentation

## 2017-12-12 DIAGNOSIS — Z79899 Other long term (current) drug therapy: Secondary | ICD-10-CM | POA: Insufficient documentation

## 2017-12-12 DIAGNOSIS — R05 Cough: Secondary | ICD-10-CM | POA: Diagnosis not present

## 2017-12-12 DIAGNOSIS — J988 Other specified respiratory disorders: Secondary | ICD-10-CM | POA: Diagnosis not present

## 2017-12-12 LAB — PULMONARY FUNCTION TEST
DL/VA % pred: 109 %
DL/VA: 5.19 ml/min/mmHg/L
DLCO unc % pred: 57 %
DLCO unc: 20.01 ml/min/mmHg
FEF 25-75 Post: 5.14 L/sec
FEF 25-75 Pre: 1.87 L/sec
FEF2575-%Change-Post: 174 %
FEF2575-%Pred-Post: 157 %
FEF2575-%Pred-Pre: 57 %
FEV1-%Change-Post: 28 %
FEV1-%Pred-Post: 94 %
FEV1-%Pred-Pre: 73 %
FEV1-Post: 3.26 L
FEV1-Pre: 2.54 L
FEV1FVC-%Change-Post: 17 %
FEV1FVC-%Pred-Pre: 94 %
FEV6-%Change-Post: 9 %
FEV6-%Pred-Post: 87 %
FEV6-%Pred-Pre: 79 %
FEV6-Post: 3.73 L
FEV6-Pre: 3.4 L
FEV6FVC-%Change-Post: 0 %
FEV6FVC-%Pred-Post: 103 %
FEV6FVC-%Pred-Pre: 102 %
FVC-%Change-Post: 9 %
FVC-%Pred-Post: 84 %
FVC-%Pred-Pre: 77 %
FVC-Post: 3.73 L
FVC-Pre: 3.41 L
Post FEV1/FVC ratio: 88 %
Post FEV6/FVC ratio: 100 %
Pre FEV1/FVC ratio: 75 %
Pre FEV6/FVC Ratio: 100 %
RV % pred: 129 %
RV: 2.93 L
TLC % pred: 80 %
TLC: 5.95 L

## 2017-12-12 MED ORDER — ALBUTEROL SULFATE (2.5 MG/3ML) 0.083% IN NEBU
2.5000 mg | INHALATION_SOLUTION | Freq: Once | RESPIRATORY_TRACT | Status: AC
  Administered 2017-12-12: 2.5 mg via RESPIRATORY_TRACT

## 2017-12-19 ENCOUNTER — Encounter (INDEPENDENT_AMBULATORY_CARE_PROVIDER_SITE_OTHER): Payer: Self-pay

## 2017-12-19 ENCOUNTER — Telehealth: Payer: Self-pay | Admitting: Internal Medicine

## 2017-12-19 VITALS — Wt 202.8 lb

## 2017-12-19 DIAGNOSIS — Z006 Encounter for examination for normal comparison and control in clinical research program: Secondary | ICD-10-CM

## 2017-12-19 NOTE — Progress Notes (Signed)
Participant here for study [id number] month 16 visit. He voices no complaints and has had no changes in medications. He is scheduled for next visit on 04/17/18. Received new dispense of study IP.

## 2017-12-19 NOTE — Telephone Encounter (Signed)
Called pt's mobile number to discuss his PFT results. No answer, left voicemail asking him to call back.

## 2017-12-20 ENCOUNTER — Telehealth: Payer: Self-pay

## 2017-12-20 NOTE — Telephone Encounter (Signed)
Returning a called to Dr Truman Hayward. Please call pt back.

## 2017-12-21 NOTE — Telephone Encounter (Signed)
Spoke with patient about his PFTs. He states he would like to come in to speak about being put on maintenance inhaler in person.

## 2017-12-21 NOTE — Telephone Encounter (Signed)
Per dr Truman Hayward, pt wants an appt- [facility] PFT"S

## 2017-12-26 ENCOUNTER — Encounter: Payer: Medicare Other | Admitting: Internal Medicine

## 2018-01-04 ENCOUNTER — Other Ambulatory Visit: Payer: Self-pay | Admitting: Internal Medicine

## 2018-01-05 NOTE — Telephone Encounter (Signed)
Next appt scheduled 8/27 with PCP. 

## 2018-01-08 ENCOUNTER — Other Ambulatory Visit: Payer: Self-pay | Admitting: *Deleted

## 2018-01-08 DIAGNOSIS — G6289 Other specified polyneuropathies: Secondary | ICD-10-CM

## 2018-01-08 NOTE — Telephone Encounter (Signed)
Next appt scheduled tomorrow w/PCP. 

## 2018-01-09 ENCOUNTER — Encounter: Payer: Medicare Other | Admitting: Internal Medicine

## 2018-01-09 ENCOUNTER — Encounter: Payer: Self-pay | Admitting: Internal Medicine

## 2018-01-10 ENCOUNTER — Telehealth: Payer: Self-pay | Admitting: Internal Medicine

## 2018-01-10 MED ORDER — PREGABALIN 50 MG PO CAPS: 50.0000 mg | ORAL_CAPSULE | Freq: Every day | ORAL | 0 refills | Status: DC

## 2018-01-10 NOTE — Telephone Encounter (Signed)
Print option selected. Called in to pharmacist at [facility]. Hubbard Hartshorn, RN, BSN

## 2018-01-10 NOTE — Telephone Encounter (Signed)
Spoke with patient on his mobile phone. Patient states he is [place] and missed his appointment. Requesting refill for his Lyrica. He states he would like to make a new appointment as soon as possible. Informed the patient that I would not have any openings until November unless he comes next week but he can always come in for [facility]. He states he will work on making an appointment.

## 2018-01-11 NOTE — Telephone Encounter (Signed)
Oops. Thank you!!

## 2018-02-08 ENCOUNTER — Other Ambulatory Visit: Payer: Self-pay | Admitting: *Deleted

## 2018-02-08 MED ORDER — ALBUTEROL SULFATE HFA 108 (90 BASE) MCG/ACT IN AERS: INHALATION_SPRAY | RESPIRATORY_TRACT | 0 refills | Status: DC

## 2018-02-19 ENCOUNTER — Ambulatory Visit (INDEPENDENT_AMBULATORY_CARE_PROVIDER_SITE_OTHER): Payer: Medicare Other

## 2018-02-19 DIAGNOSIS — Z23 Encounter for immunization: Secondary | ICD-10-CM

## 2018-03-12 ENCOUNTER — Other Ambulatory Visit: Payer: Self-pay | Admitting: Internal Medicine

## 2018-03-12 DIAGNOSIS — J069 Acute upper respiratory infection, unspecified: Secondary | ICD-10-CM

## 2018-03-12 DIAGNOSIS — B20 Human immunodeficiency virus [HIV] disease: Secondary | ICD-10-CM

## 2018-04-11 ENCOUNTER — Encounter (INDEPENDENT_AMBULATORY_CARE_PROVIDER_SITE_OTHER): Payer: Self-pay | Admitting: *Deleted

## 2018-04-11 VITALS — BP 107/76 | HR 99 | Temp 97.8°F | Wt 196.5 lb

## 2018-04-11 DIAGNOSIS — Z006 Encounter for examination for normal comparison and control in clinical research program: Secondary | ICD-10-CM

## 2018-04-11 NOTE — Research (Signed)
John Bullock came in today for his Reprieve month 20 visit. He reported no cardiac events, muscle pain or muscle weakness. He is taking his study med and [place] daily. No labs were collected. He will return for his month 24 visit 4/13.

## 2018-04-24 ENCOUNTER — Telehealth: Payer: Self-pay

## 2018-04-24 DIAGNOSIS — G629 Polyneuropathy, unspecified: Secondary | ICD-10-CM | POA: Diagnosis not present

## 2018-04-24 DIAGNOSIS — Z79899 Other long term (current) drug therapy: Secondary | ICD-10-CM | POA: Diagnosis not present

## 2018-04-24 DIAGNOSIS — B20 Human immunodeficiency virus [HIV] disease: Secondary | ICD-10-CM | POA: Diagnosis not present

## 2018-04-24 NOTE — Telephone Encounter (Signed)
Patient walked into clinic today with medication questions. Patient has recently been prescribed Cymbalta by his PCP. Patient would like to know if medication had any interaction with Genvoya. Spoke with pharmacist who states no drug to drug interaction listed just have patient monitor mood when taking medication together. Informed patient who does not have any additional concerns at this time. Will call office if any questions/ concerns arise.  [place]

## 2018-05-04 ENCOUNTER — Telehealth: Payer: Self-pay | Admitting: *Deleted

## 2018-05-04 NOTE — Telephone Encounter (Signed)
Received call from [facility] stating Dr Marguerita Beards DEA is not working-unable to refill the lyrica.  Pharmacy  Requesting attending md info-info given.Regenia Skeeter, Vicente Weidler Cassady12/20/20192:38 PM   Will forward to [facility] Practice Administrator to assist with getting DrLee's problem resolved.Despina Hidden Cassady12/20/20192:44 PM

## 2018-05-24 ENCOUNTER — Other Ambulatory Visit: Payer: Self-pay

## 2018-05-24 DIAGNOSIS — G6289 Other specified polyneuropathies: Secondary | ICD-10-CM

## 2018-05-24 MED ORDER — PREGABALIN 50 MG PO CAPS: 50.0000 mg | ORAL_CAPSULE | Freq: Every day | ORAL | 0 refills | Status: AC

## 2018-05-24 NOTE — Telephone Encounter (Signed)
Refill request received for Lyrica from pharmacy via fax.  Last OV 12/05/17 Please see TC note from 01/10/18. Thank you! SChaplin, RN,BSN

## 2018-06-19 ENCOUNTER — Other Ambulatory Visit: Payer: Self-pay | Admitting: Internal Medicine

## 2018-07-18 ENCOUNTER — Other Ambulatory Visit: Payer: Self-pay | Admitting: Internal Medicine

## 2018-07-18 DIAGNOSIS — J069 Acute upper respiratory infection, unspecified: Secondary | ICD-10-CM

## 2018-07-18 DIAGNOSIS — B20 Human immunodeficiency virus [HIV] disease: Secondary | ICD-10-CM

## 2018-07-31 ENCOUNTER — Encounter: Payer: Self-pay | Admitting: Gastroenterology

## 2018-08-21 ENCOUNTER — Encounter (INDEPENDENT_AMBULATORY_CARE_PROVIDER_SITE_OTHER): Payer: Self-pay | Admitting: *Deleted

## 2018-08-21 ENCOUNTER — Other Ambulatory Visit: Payer: Self-pay

## 2018-08-21 VITALS — BP 120/78 | HR 63 | Temp 98.0°F | Wt 194.8 lb

## 2018-08-21 DIAGNOSIS — Z006 Encounter for examination for normal comparison and control in clinical research program: Secondary | ICD-10-CM

## 2018-08-21 NOTE — Research (Signed)
Karlton here for his Reprieve month 24 visit. States that he ran out of his study medication over the weekend. He said that he did receive a refill at his last visit in November. After further discussion, he feels he may have another bottle at home. Verified his mailing address in Epic and notified pharmacy to dispense/mail his next refill of study medication. Fasting study labs obtained. He denied any cardiac events, muscle aches or weakness. No new complaints or medications. He will return in August for his next study visit.

## 2018-09-14 ENCOUNTER — Other Ambulatory Visit: Payer: Self-pay | Admitting: Internal Medicine

## 2018-10-29 ENCOUNTER — Other Ambulatory Visit: Payer: Medicare HMO

## 2018-10-29 ENCOUNTER — Other Ambulatory Visit: Payer: Self-pay

## 2018-10-29 ENCOUNTER — Other Ambulatory Visit (HOSPITAL_COMMUNITY)
Admission: RE | Admit: 2018-10-29 | Discharge: 2018-10-29 | Disposition: A | Discharge status: Home / Self Care | Payer: Medicare HMO | Source: Ambulatory Visit | Attending: Internal Medicine | Admitting: Internal Medicine

## 2018-10-29 DIAGNOSIS — B20 Human immunodeficiency virus [HIV] disease: Secondary | ICD-10-CM

## 2018-10-29 DIAGNOSIS — Z113 Encounter for screening for infections with a predominantly sexual mode of transmission: Secondary | ICD-10-CM

## 2018-10-30 LAB — T-HELPER CELL (CD4) - (RCID CLINIC ONLY)
CD4 % Helper T Cell: 34 % (ref 33–65)
CD4 T Cell Abs: 569 /uL (ref 400–1790)

## 2018-10-31 LAB — URINE CYTOLOGY ANCILLARY ONLY
Chlamydia: NEGATIVE
Neisseria Gonorrhea: NEGATIVE

## 2018-11-01 ENCOUNTER — Telehealth: Payer: Self-pay | Admitting: Internal Medicine

## 2018-11-01 NOTE — Telephone Encounter (Signed)
COVID-19 Pre-Screening Questions: ° °Do you currently have a fever (>100 °F), chills or unexplained body aches? No  ° °Are you currently experiencing new cough, shortness of breath, sore throat, runny nose? No  °•  °Have you recently travelled outside the state of [place] in the last 14 days? No  °•  °Have you been in contact with someone that is currently pending confirmation of Covid19 testing or has been confirmed to have the Covid19 virus?  No  °

## 2018-11-02 ENCOUNTER — Other Ambulatory Visit: Payer: Self-pay

## 2018-11-02 DIAGNOSIS — Z20822 Contact with and (suspected) exposure to covid-19: Secondary | ICD-10-CM

## 2018-11-03 LAB — CBC WITH DIFFERENTIAL/PLATELET
Absolute Monocytes: 468 cells/uL (ref 200–950)
Basophils Absolute: 22 cells/uL (ref 0–200)
Basophils Relative: 0.6 %
Eosinophils Absolute: 50 cells/uL (ref 15–500)
Eosinophils Relative: 1.4 %
HCT: 38.3 % — ABNORMAL LOW (ref 38.5–50.0)
Hemoglobin: 13.2 g/dL (ref 13.2–17.1)
Lymphs Abs: 1631 cells/uL (ref 850–3900)
MCH: 30.6 pg (ref 27.0–33.0)
MCHC: 34.5 g/dL (ref 32.0–36.0)
MCV: 88.9 fL (ref 80.0–100.0)
MPV: 11.3 fL (ref 7.5–12.5)
Monocytes Relative: 13 %
Neutro Abs: 1429 cells/uL — ABNORMAL LOW (ref 1500–7800)
Neutrophils Relative %: 39.7 %
Platelets: 179 10*3/uL (ref 140–400)
RBC: 4.31 10*6/uL (ref 4.20–5.80)
RDW: 13.4 % (ref 11.0–15.0)
Total Lymphocyte: 45.3 %
WBC: 3.6 10*3/uL — ABNORMAL LOW (ref 3.8–10.8)

## 2018-11-03 LAB — COMPLETE METABOLIC PANEL WITH GFR
AG Ratio: 1.9 (calc) (ref 1.0–2.5)
ALT: 8 U/L — ABNORMAL LOW (ref 9–46)
AST: 19 U/L (ref 10–35)
Albumin: 4.4 g/dL (ref 3.6–5.1)
Alkaline phosphatase (APISO): 64 U/L (ref 35–144)
BUN: 20 mg/dL (ref 7–25)
CO2: 23 mmol/L (ref 20–32)
Calcium: 9.1 mg/dL (ref 8.6–10.3)
Chloride: 109 mmol/L (ref 98–110)
Creat: 1.27 mg/dL (ref 0.70–1.33)
GFR, Est African American: 72 mL/min/{1.73_m2} (ref >=60)
GFR, Est Non African American: 62 mL/min/{1.73_m2} (ref >=60)
Globulin: 2.3 g/dL (calc) (ref 1.9–3.7)
Glucose, Bld: 79 mg/dL (ref 65–99)
Potassium: 4.3 mmol/L (ref 3.5–5.3)
Sodium: 143 mmol/L (ref 135–146)
Total Bilirubin: 0.3 mg/dL (ref 0.2–1.2)
Total Protein: 6.7 g/dL (ref 6.1–8.1)

## 2018-11-03 LAB — HIV-1 RNA QUANT-NO REFLEX-BLD
HIV 1 RNA Quant: <20 copies/mL — AB
HIV-1 RNA Quant, Log: <1.3 Log copies/mL — AB

## 2018-11-03 LAB — RPR: RPR Ser Ql: NONREACTIVE

## 2018-11-05 ENCOUNTER — Ambulatory Visit: Payer: Medicare Other | Admitting: Internal Medicine

## 2018-11-05 LAB — NOVEL CORONAVIRUS, NAA: SARS-CoV-2, NAA: NOT DETECTED

## 2018-11-14 ENCOUNTER — Telehealth: Payer: Self-pay | Admitting: Internal Medicine

## 2018-11-14 ENCOUNTER — Telehealth: Payer: Self-pay | Admitting: Infectious Disease

## 2018-11-14 NOTE — Telephone Encounter (Signed)
COVID-19 Pre-Screening Questions: ° °Do you currently have a fever (>100 °F), chills or unexplained body aches? No  ° °Are you currently experiencing new cough, shortness of breath, sore throat, runny nose? No  °•  °Have you recently travelled outside the state of [place] in the last 14 days? No  °•  °1. Have you been in contact with someone that is currently pending confirmation of Covid19 testing or has been confirmed to have the Covid19 virus?  No  ° °

## 2018-11-15 ENCOUNTER — Ambulatory Visit: Payer: Medicare HMO | Admitting: Internal Medicine

## 2018-11-26 ENCOUNTER — Ambulatory Visit: Payer: Medicare HMO | Admitting: Internal Medicine

## 2018-12-19 ENCOUNTER — Encounter (INDEPENDENT_AMBULATORY_CARE_PROVIDER_SITE_OTHER): Payer: Self-pay

## 2018-12-19 ENCOUNTER — Other Ambulatory Visit: Payer: Self-pay

## 2018-12-19 VITALS — BP 112/72 | HR 56 | Temp 98.2°F

## 2018-12-19 DIAGNOSIS — Z006 Encounter for examination for normal comparison and control in clinical research program: Secondary | ICD-10-CM

## 2018-12-20 NOTE — Research (Signed)
Participant here for study [phone]. He has no new complaints or concerns. He is taking his Genvoya and study medication daily. He confirmed his address and will be shipped his study medication as the pharmacist is currently out of office. He is scheduled for his next study visit on 04/22/19.

## 2018-12-25 ENCOUNTER — Encounter: Payer: Medicare HMO | Admitting: *Deleted

## 2018-12-31 ENCOUNTER — Ambulatory Visit: Payer: Medicare HMO | Admitting: Internal Medicine

## 2019-01-02 ENCOUNTER — Other Ambulatory Visit: Payer: Self-pay | Admitting: Internal Medicine

## 2019-01-02 DIAGNOSIS — J069 Acute upper respiratory infection, unspecified: Secondary | ICD-10-CM

## 2019-01-02 DIAGNOSIS — B20 Human immunodeficiency virus [HIV] disease: Secondary | ICD-10-CM

## 2019-01-14 ENCOUNTER — Encounter: Payer: Self-pay | Admitting: Internal Medicine

## 2019-01-14 ENCOUNTER — Other Ambulatory Visit: Payer: Self-pay

## 2019-01-14 ENCOUNTER — Ambulatory Visit (INDEPENDENT_AMBULATORY_CARE_PROVIDER_SITE_OTHER): Payer: Medicare HMO | Admitting: Internal Medicine

## 2019-01-14 VITALS — BP 153/96 | HR 61 | Temp 98.0°F

## 2019-01-14 DIAGNOSIS — Z79899 Other long term (current) drug therapy: Secondary | ICD-10-CM | POA: Diagnosis not present

## 2019-01-14 DIAGNOSIS — B20 Human immunodeficiency virus [HIV] disease: Secondary | ICD-10-CM | POA: Diagnosis not present

## 2019-01-14 DIAGNOSIS — Z720 Tobacco use: Secondary | ICD-10-CM

## 2019-01-14 DIAGNOSIS — Z113 Encounter for screening for infections with a predominantly sexual mode of transmission: Secondary | ICD-10-CM | POA: Diagnosis not present

## 2019-01-14 DIAGNOSIS — G44019 Episodic cluster headache, not intractable: Secondary | ICD-10-CM

## 2019-01-14 NOTE — Assessment & Plan Note (Signed)
Encouraged complete cessation.  He is working on it

## 2019-01-14 NOTE — Progress Notes (Signed)
   Subjective:    Patient ID: John Bullock, male    DOB: 09/01/1960, 58 y.o.   MRN: [id number]  HPI Here for follow up of HIV Continues on Genvoya and no missed doses.  In the Reprieve study and no issues.  CD4 of 569 and viral load < 20.  No new concerns. No new issues.  No longer needing to follow in the [facility] study.     Review of Systems  Gastrointestinal: Negative for diarrhea.  Skin: Negative for rash.  Neurological: Negative for dizziness.       Objective:   Physical Exam  Constitutional: He appears well-developed and well-nourished.  Eyes: No scleral icterus.  Cardiovascular: Normal rate, regular rhythm and normal heart sounds.  Pulmonary/Chest: Effort normal and breath sounds normal. No respiratory distress. He has no wheezes.  Skin: No rash noted.   SH: tobacco use as above but decreasing        Assessment & Plan:

## 2019-01-14 NOTE — Assessment & Plan Note (Signed)
Doing well with this, no issues and rtc 1 year

## 2019-01-14 NOTE — Assessment & Plan Note (Signed)
Screened negative 

## 2019-01-14 NOTE — Assessment & Plan Note (Signed)
This has improved compared to last year with less "cheap' beer

## 2019-02-18 ENCOUNTER — Other Ambulatory Visit: Payer: Self-pay | Admitting: Internal Medicine

## 2019-02-18 DIAGNOSIS — B20 Human immunodeficiency virus [HIV] disease: Secondary | ICD-10-CM

## 2019-02-18 DIAGNOSIS — J069 Acute upper respiratory infection, unspecified: Secondary | ICD-10-CM

## 2019-03-25 ENCOUNTER — Ambulatory Visit (INDEPENDENT_AMBULATORY_CARE_PROVIDER_SITE_OTHER): Payer: Medicare HMO

## 2019-03-25 DIAGNOSIS — Z23 Encounter for immunization: Secondary | ICD-10-CM

## 2019-04-22 ENCOUNTER — Encounter: Payer: Medicare HMO | Admitting: *Deleted

## 2019-04-26 ENCOUNTER — Other Ambulatory Visit: Payer: Self-pay

## 2019-04-26 ENCOUNTER — Encounter (INDEPENDENT_AMBULATORY_CARE_PROVIDER_SITE_OTHER): Payer: Self-pay | Admitting: *Deleted

## 2019-04-26 VITALS — BP 124/79 | HR 49 | Temp 97.5°F | Wt 196.4 lb

## 2019-04-26 DIAGNOSIS — Z006 Encounter for examination for normal comparison and control in clinical research program: Secondary | ICD-10-CM

## 2019-04-26 NOTE — Research (Signed)
John Bullock is here for his month 27 Reprieve visit. No new complaints or medications. Denied any muscle aches or weakness. Verbalized excellent adherence with [name] and study medications. He will return in April for his next study visit.

## 2019-05-21 ENCOUNTER — Other Ambulatory Visit: Payer: Self-pay | Admitting: Internal Medicine

## 2019-08-19 ENCOUNTER — Other Ambulatory Visit: Payer: Self-pay | Admitting: Internal Medicine

## 2019-08-19 DIAGNOSIS — B20 Human immunodeficiency virus [HIV] disease: Secondary | ICD-10-CM

## 2019-08-19 DIAGNOSIS — J069 Acute upper respiratory infection, unspecified: Secondary | ICD-10-CM

## 2019-08-22 ENCOUNTER — Encounter: Payer: Medicare HMO | Admitting: *Deleted

## 2019-09-02 ENCOUNTER — Encounter (INDEPENDENT_AMBULATORY_CARE_PROVIDER_SITE_OTHER): Payer: Self-pay | Admitting: *Deleted

## 2019-09-02 ENCOUNTER — Other Ambulatory Visit: Payer: Self-pay

## 2019-09-02 VITALS — BP 136/90 | HR 59 | Temp 97.4°F | Wt 203.4 lb

## 2019-09-02 DIAGNOSIS — Z006 Encounter for examination for normal comparison and control in clinical research program: Secondary | ICD-10-CM
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
## 2019-09-04 LAB — HEPATITIS B SURFACE ANTIBODY,QUALITATIVE: Hep B S Ab: REACTIVE — AB

## 2019-09-09 ENCOUNTER — Ambulatory Visit (INDEPENDENT_AMBULATORY_CARE_PROVIDER_SITE_OTHER): Payer: Medicare HMO | Admitting: Internal Medicine

## 2019-09-09 ENCOUNTER — Other Ambulatory Visit: Payer: Self-pay | Admitting: Internal Medicine

## 2019-09-09 ENCOUNTER — Other Ambulatory Visit: Payer: Self-pay

## 2019-09-09 ENCOUNTER — Ambulatory Visit (HOSPITAL_COMMUNITY)
Admission: RE | Admit: 2019-09-09 | Discharge: 2019-09-09 | Disposition: A | Discharge status: Home / Self Care | Payer: Medicare HMO | Source: Ambulatory Visit | Attending: Internal Medicine | Admitting: Internal Medicine

## 2019-09-09 VITALS — BP 144/90 | HR 64 | Temp 98.1°F | Ht 72.0 in | Wt 205.0 lb

## 2019-09-09 DIAGNOSIS — B20 Human immunodeficiency virus [HIV] disease: Secondary | ICD-10-CM

## 2019-09-09 DIAGNOSIS — I10 Essential (primary) hypertension: Secondary | ICD-10-CM | POA: Diagnosis not present

## 2019-09-09 DIAGNOSIS — M25571 Pain in right ankle and joints of right foot: Secondary | ICD-10-CM | POA: Diagnosis not present

## 2019-09-09 DIAGNOSIS — G6289 Other specified polyneuropathies: Secondary | ICD-10-CM

## 2019-09-09 NOTE — Telephone Encounter (Signed)
Pt was last seen 11/2017. Called pt to schedule an appt - stated he can come today; appt schedule @ 1445 PM w/ Dr Truman Hayward.
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
## 2019-09-09 NOTE — Assessment & Plan Note (Signed)
BP Readings from Last 3 Encounters:  09/09/19 (!) 144/90  09/02/19 136/90  04/26/19 124/79   Blood pressure above goal at this visit but currently in pain. Recent recording at separate appointment shows well controlled BP. Currently not on any anti-hypertensive meds.  - Monitor at future visit

## 2019-09-09 NOTE — Patient Instructions (Signed)
Dear [name],  Thank you for allowing [place] to provide your care today. Today we discussed your ankle pain    I have ordered x-ray of your ankle for you. I will call if any are abnormal.    Today we made the following changes to your medications:    Please follow-up as needed if your ankle pain worsens.    Should you have any questions or concerns please call the internal medicine clinic at [phone].    Thank you for choosing [facility].   Ankle Sprain  An ankle sprain is a stretch or tear in one of the tough tissues (ligaments) that connect the bones in your ankle. An ankle sprain can happen when the ankle rolls outward (inversion sprain) or inward (eversion sprain). What are the causes? This condition is caused by rolling or twisting the ankle. What increases the risk? You are more likely to develop this condition if you play sports. What are the signs or symptoms? Symptoms of this condition include:  Pain in your ankle.  Swelling.  Bruising. This may happen right after you sprain your ankle or 1-2 days later.  Trouble standing or walking. How is this diagnosed? This condition is diagnosed with:  A physical exam. During the exam, your doctor will press on certain parts of your foot and ankle and try to move them in certain ways.  X-ray imaging. These may be taken to see how bad the sprain is and to check for broken bones. How is this treated? This condition may be treated with:  A brace or splint. This is used to keep the ankle from moving until it heals.  An elastic bandage. This is used to support the ankle.  Crutches.  Pain medicine.  Surgery. This may be needed if the sprain is very bad.  Physical therapy. This may help to improve movement in the ankle. Follow these instructions at home: If you have a brace or a splint:  Wear the brace or splint as told by your doctor. Remove it only as told by your doctor.  Loosen the brace or splint if your  toes: ? Tingle. ? Lose feeling (become numb). ? Turn cold and blue.  Keep the brace or splint clean.  If the brace or splint is not waterproof: ? Do not let it get wet. ? Cover it with a watertight covering when you take a bath or a shower. If you have an elastic bandage (dressing):  Remove it to shower or bathe.  Try not to move your ankle much, but wiggle your toes from time to time. This helps to prevent swelling.  Adjust the dressing if it feels too tight.  Loosen the dressing if your foot: ? Loses feeling. ? Tingles. ? Becomes cold and blue. Managing pain, stiffness, and swelling   Take over-the-counter and prescription medicines only as told by doctor.  For 2-3 days, keep your ankle raised (elevated) above the level of your heart.  If told, put ice on the injured area: ? If you have a removable brace or splint, remove it as told by your doctor. ? Put ice in a plastic bag. ? Place a towel between your skin and the bag. ? Leave the ice on for 20 minutes, 2-3 times a day. General instructions  Rest your ankle.  Do not use your injured leg to support your body weight until your doctor says that you can. Use crutches as told by your doctor.  Do not use any  products that contain nicotine or tobacco, such as cigarettes, e-cigarettes, and chewing tobacco. If you need help quitting, ask your doctor.  Keep all follow-up visits as told by your doctor. Contact a doctor if:  Your bruises or swelling are quickly getting worse.  Your pain does not get better after you take medicine. Get help right away if:  You cannot feel your toes or foot.  Your foot or toes look blue.  You have very bad pain that gets worse. Summary  An ankle sprain is a stretch or tear in one of the tough tissues (ligaments) that connect the bones in your ankle.  This condition is caused by rolling or twisting the ankle.  Symptoms include pain, swelling, bruising, and trouble walking.  To  help with pain and swelling, put ice on the injured ankle, raise your ankle above the level of your heart, and use an elastic bandage. Also, rest as told by your doctor.  Keep all follow-up visits as told by your doctor. This is important. This information is not intended to replace advice given to you by your health care provider. Make sure you discuss any questions you have with your health care provider. Document Revised: 09/26/2017 Document Reviewed: 09/26/2017 Elsevier Patient Education  [employer].

## 2019-09-11 ENCOUNTER — Telehealth: Payer: Self-pay | Admitting: Internal Medicine

## 2019-09-11 DIAGNOSIS — G6289 Other specified polyneuropathies: Secondary | ICD-10-CM

## 2019-09-11 MED ORDER — NAPROXEN 375 MG PO TBEC: DELAYED_RELEASE_TABLET | ORAL | 0 refills | Status: DC

## 2019-09-11 NOTE — Addendum Note (Signed)
Addended by: Mosetta Anis on: 09/11/2019 11:55 AM   Modules accepted: Orders

## 2019-09-11 NOTE — Telephone Encounter (Signed)
Spoke with John Bullock regarding his X-ray findings and need to see ortho. Also encouraged to continue NSAID therapy for pain for now. John Bullock expressed understanding.

## 2019-09-13 NOTE — Progress Notes (Signed)
Internal Medicine Clinic Attending ° °Case discussed with Dr. Lee at the time of the visit.  We reviewed the resident’s history and exam and pertinent patient test results.  I agree with the assessment, diagnosis, and plan of care documented in the resident’s note.  °

## 2019-11-04 ENCOUNTER — Other Ambulatory Visit: Payer: Self-pay | Admitting: Internal Medicine

## 2019-12-02 ENCOUNTER — Encounter (HOSPITAL_COMMUNITY): Payer: Self-pay

## 2019-12-02 ENCOUNTER — Ambulatory Visit (HOSPITAL_COMMUNITY)
Admission: EM | Admit: 2019-12-02 | Discharge: 2019-12-02 | Disposition: A | Discharge status: Home / Self Care | Payer: Medicare HMO | Attending: Family Medicine | Admitting: Family Medicine

## 2019-12-02 ENCOUNTER — Ambulatory Visit (INDEPENDENT_AMBULATORY_CARE_PROVIDER_SITE_OTHER): Payer: Medicare HMO

## 2019-12-02 ENCOUNTER — Other Ambulatory Visit: Payer: Self-pay

## 2019-12-02 DIAGNOSIS — G6289 Other specified polyneuropathies: Secondary | ICD-10-CM

## 2019-12-02 DIAGNOSIS — M79644 Pain in right finger(s): Secondary | ICD-10-CM | POA: Diagnosis not present

## 2019-12-02 MED ORDER — NAPROXEN 375 MG PO TBEC: DELAYED_RELEASE_TABLET | ORAL | 0 refills | Status: DC

## 2019-12-02 NOTE — Discharge Instructions (Signed)
Your xray is reassuring without any acute findings today.  Ice application to the thumb, especially after increased use.  Naproxen twice a day to help with pain and swelling, take with food.  If persistent I would recommend following up with sports medicine for further evaluation and management.

## 2019-12-02 NOTE — ED Provider Notes (Signed)
[facility]    CSN: [id number] Arrival date & time: 12/02/19  1043      History   Chief Complaint Chief Complaint  Patient presents with  . Hand Injury    HPI John Bullock is a 59 y.o. male.   Mike Craze presents with complaints of right thumb pain. Started 1 week ago. Swelling present, burning sensation. No injury. He is left handed. Denies any repetitive use of hand. No numbness or tingling. Experiences cramps at times to hands and to calves, endorses not regularly drinking water. Hasn't taken any medications for pain. No redness. No history of gout or kidney stones. Rare use of alcohol.    ROS per HPI, negative if not otherwise mentioned.      Past Medical History:  Diagnosis Date  . HIV infection ([place])   . HTN (hypertension) 09/11/2014  . Migraine     Patient Active Problem List   Diagnosis Date Noted  . Acute right ankle pain 09/09/2019  . Obesity (BMI 30.0-34.9) 07/24/2015  . Peripheral nerve disease 01/13/2015  . Hypertension 09/11/2014  . Migraines, neuralgic 12/20/2013  . Sleep difficulties 04/23/2013  . Tobacco abuse 10/04/2012  . Human immunodeficiency virus (HIV) infection ([place]) 12/15/2010    Past Surgical History:  Procedure Laterality Date  . APPENDECTOMY  1990       Home Medications    Prior to Admission medications   Medication Sig Start Date End Date Taking? Authorizing Provider  albuterol (VENTOLIN HFA) 108 (90 Base) MCG/ACT inhaler INHALE TWO PUFFS BY MOUTH INTO THE LUNGS EVERY 4 HOURS AS NEEDED FOR WHEEZING OR SHORTNESS OF BREATH 11/04/19   Mosetta Anis, MD  cetirizine (ZYRTEC) 10 MG tablet TAKE ONE TABLET BY MOUTH DAILY 08/20/19   Thayer Headings, MD  fluticasone Asencion Islam) 50 MCG/ACT nasal spray  08/01/19   [provider]  GENVOYA 150-150-200-10 MG TABS tablet TAKE ONE TABLET BY MOUTH DAILY WITH BREAKFAST 08/20/19   Comer, Okey Regal, MD  Naproxen (NAPROXEN) 375 MG TBEC TAKE ONE TABLET BY MOUTH TWICE A DAY BEFORE A  MEAL 12/02/19   Augusto Gamble B, NP  oseltamivir (TAMIFLU) 75 MG capsule Take 1 capsule (75 mg total) by mouth every 12 (twelve) hours. 07/05/17   Kirichenko, Lahoma Rocker, PA-C  Pitavastatin Calcium 4 MG TABS Take 1 tablet (4 mg total) by mouth daily. This may be placebo and is study provided. Do not fill prescription. 09/06/16   Truman Hayward, MD  pregabalin (LYRICA) 50 MG capsule Take 1 capsule (50 mg total) by mouth daily. 05/24/18 05/24/19  Mosetta Anis, MD  pregabalin (LYRICA) 75 MG capsule  07/02/19   [provider]    Family History Family History  Problem Relation Age of Onset  . Brain cancer Mother   . Cancer Mother   . Stroke Mother   . Testicular cancer Father   . Cancer Father   . Stroke Father   . Hypertension Father   . Diabetes Father     Social History Social History   Tobacco Use  . Smoking status: Current Every Day Smoker    Packs/day: 0.10    Years: 30.00    Pack years: 3.00    Types: Cigarettes  . Smokeless tobacco: Never Used  . Tobacco comment: trying to quit. 2-3 per day   Vaping Use  . Vaping Use: Never used  Substance Use Topics  . Alcohol use: Yes    Alcohol/week: 4.0 standard drinks    Types:  4 Shots of liquor per week    Comment: on Thursdays with [employer] game  . Drug use: No     Allergies   Nicotine step [nicotine]   Review of Systems Review of Systems   Physical Exam Triage Vital Signs ED Triage Vitals  Enc Vitals Group     BP 12/02/19 1113 114/87     Pulse Rate 12/02/19 1113 60     Resp 12/02/19 1113 17     Temp 12/02/19 1113 98.4 F (36.9 C)     Temp Source 12/02/19 1113 Oral     SpO2 12/02/19 1113 91 %     Weight 12/02/19 1112 210 lb (95.3 kg)     Height --      Head Circumference --      Peak Flow --      Pain Score 12/02/19 1112 6     Pain Loc --      Pain Edu? --      Excl. in [place]? --    No data found.  Updated Vital Signs BP 114/87 (BP Location: Right Arm)   Pulse 60   Temp 98.4 F (36.9 C) (Oral)    Resp 17   Wt 210 lb (95.3 kg)   SpO2 91%   BMI 28.48 kg/m    Physical Exam Constitutional:      Appearance: He is well-developed.  Cardiovascular:     Rate and Rhythm: Normal rate.  Pulmonary:     Effort: Pulmonary effort is normal.  Musculoskeletal:     Right hand: Tenderness and bony tenderness present. Decreased range of motion.     Comments: Tenderness at thumb MCP joint without redness warmth or significant swelling; pain limiting ROM with opposition, flexion, as well as lateral movement; cap refill < 2 seconds; sensation intact   Skin:    General: Skin is warm and dry.  Neurological:     Mental Status: He is alert and oriented to person, place, and time.      UC Treatments / Results  Labs (all labs ordered are listed, but only abnormal results are displayed) Labs Reviewed - No data to display  EKG   Radiology DG Finger Thumb Right  Result Date: 12/02/2019 CLINICAL DATA:  Thumb pain for 2 weeks, no known injury, initial encounter EXAM: RIGHT THUMB 2+V COMPARISON:  None. FINDINGS: There is no evidence of fracture or dislocation. There is no evidence of arthropathy or other focal bone abnormality. Soft tissues are unremarkable. IMPRESSION: No acute abnormality noted. Electronically Signed   By: Inez Catalina M.D.   On: 12/02/2019 13:14    Procedures Procedures (including critical care time)  Medications Ordered in UC Medications - No data to display  Initial Impression / Assessment and Plan / UC Course  I have reviewed the triage vital signs and the nursing notes.  Pertinent labs & imaging results that were available during my care of the patient were reviewed by me and considered in my medical decision making (see chart for details).     Films WNL. No redness, warmth or significant swelling.  Strain vs mild arthritis discussed and considered. Hydration encouraged for cramping. Two weeks of nsaids recommended to try to ease symptoms, follow up with sports  medicine prn if symptoms persist. Patient verbalized understanding and agreeable to plan.   Final Clinical Impressions(s) / UC Diagnoses   Final diagnoses:  Thumb pain, right     Discharge Instructions     Your xray is reassuring without any  acute findings today.  Ice application to the thumb, especially after increased use.  Naproxen twice a day to help with pain and swelling, take with food.  If persistent I would recommend following up with sports medicine for further evaluation and management.    ED Prescriptions    Medication Sig Dispense Auth. Provider   Naproxen (NAPROXEN) 375 MG TBEC TAKE ONE TABLET BY MOUTH TWICE A DAY BEFORE A MEAL 30 tablet Zigmund Gottron, NP     PDMP not reviewed this encounter.   Zigmund Gottron, NP 12/02/19 1341

## 2019-12-02 NOTE — ED Triage Notes (Signed)
Pt is here with right hand swelling that started a week ago, pt has not taken anything to relieve discomfort,

## 2019-12-23 ENCOUNTER — Encounter (INDEPENDENT_AMBULATORY_CARE_PROVIDER_SITE_OTHER): Payer: Self-pay | Admitting: *Deleted

## 2019-12-23 ENCOUNTER — Other Ambulatory Visit: Payer: Self-pay

## 2019-12-23 VITALS — BP 110/76 | HR 58 | Temp 97.7°F | Wt 205.5 lb

## 2019-12-23 DIAGNOSIS — Z006 Encounter for examination for normal comparison and control in clinical research program: Secondary | ICD-10-CM

## 2019-12-23 NOTE — Research (Signed)
John Bullock is here for [name]. States that he did go to see Dr. Noemi Chapel after his last study visit regarding his ankle injury. Found to have a non-displaced oblique fracture of the distal fibula. States that he was placed in a Cam Walker for 2 weeks and then instructed to wear a soft ankle brace as needed. States that swelling and discomfort to his ankle has resolved. Verbalized excellent adherence with [name] and study medications. He is scheduled to see Dr. Linus Salmons in September and will return in December for his next study visit.

## 2019-12-27 ENCOUNTER — Other Ambulatory Visit: Payer: Self-pay | Admitting: Internal Medicine

## 2019-12-27 DIAGNOSIS — B20 Human immunodeficiency virus [HIV] disease: Secondary | ICD-10-CM

## 2019-12-27 DIAGNOSIS — J069 Acute upper respiratory infection, unspecified: Secondary | ICD-10-CM

## 2020-01-14 ENCOUNTER — Other Ambulatory Visit: Payer: Medicare HMO

## 2020-01-28 ENCOUNTER — Ambulatory Visit (INDEPENDENT_AMBULATORY_CARE_PROVIDER_SITE_OTHER): Payer: Medicare HMO | Admitting: Internal Medicine

## 2020-01-28 ENCOUNTER — Other Ambulatory Visit (HOSPITAL_COMMUNITY)
Admission: RE | Admit: 2020-01-28 | Discharge: 2020-01-28 | Disposition: A | Discharge status: Home / Self Care | Payer: Medicare HMO | Source: Ambulatory Visit | Attending: Internal Medicine | Admitting: Internal Medicine

## 2020-01-28 ENCOUNTER — Encounter: Payer: Self-pay | Admitting: Internal Medicine

## 2020-01-28 ENCOUNTER — Ambulatory Visit: Payer: Medicare HMO | Attending: Internal Medicine

## 2020-01-28 ENCOUNTER — Other Ambulatory Visit: Payer: Self-pay

## 2020-01-28 ENCOUNTER — Other Ambulatory Visit (INDEPENDENT_AMBULATORY_CARE_PROVIDER_SITE_OTHER): Payer: Medicare HMO

## 2020-01-28 VITALS — BP 117/72 | HR 57

## 2020-01-28 DIAGNOSIS — Z79899 Other long term (current) drug therapy: Secondary | ICD-10-CM

## 2020-01-28 DIAGNOSIS — Z720 Tobacco use: Secondary | ICD-10-CM

## 2020-01-28 DIAGNOSIS — B20 Human immunodeficiency virus [HIV] disease: Secondary | ICD-10-CM

## 2020-01-28 DIAGNOSIS — Z23 Encounter for immunization: Secondary | ICD-10-CM | POA: Diagnosis not present

## 2020-01-28 DIAGNOSIS — Z21 Asymptomatic human immunodeficiency virus [HIV] infection status: Secondary | ICD-10-CM | POA: Diagnosis not present

## 2020-01-28 DIAGNOSIS — Z113 Encounter for screening for infections with a predominantly sexual mode of transmission: Secondary | ICD-10-CM | POA: Insufficient documentation

## 2020-01-28 NOTE — Progress Notes (Signed)
   Subjective:    Patient ID: John Bullock, male    DOB: 02/20/1961, 59 y.o.   MRN: [id number]  HPI Here for follow up of HIV Continues on Genvoya and no missed doses.  He continues in the Reprieve study and no issues.  No labs prior to the visit.  No new concerns. No new issues.   Getting his 3rd COVID shot tomorrow.    Review of Systems  Gastrointestinal: Negative for diarrhea.  Skin: Negative for rash.  Neurological: Negative for dizziness.       Objective:   Physical Exam Vitals reviewed.  Constitutional:      Appearance: He is well-developed.  Eyes:     General: No scleral icterus. Pulmonary:     Effort: Pulmonary effort is normal.  Skin:    Findings: No rash.  Neurological:     General: No focal deficit present.     Mental Status: He is alert.  Psychiatric:        Mood and Affect: Mood normal.    SH: reducing his tobacco use        Assessment & Plan:

## 2020-01-28 NOTE — Assessment & Plan Note (Signed)
Will screen today 

## 2020-01-28 NOTE — Assessment & Plan Note (Signed)
I encouraged continued efforts at smoking cessation

## 2020-01-28 NOTE — Progress Notes (Signed)
   Cov[facility] Vaccination Clinic  Name:  John Bullock    MRN: [id number] DOB: [birth year]  01/28/2020  John Bullock was observed post Covid-19 immunization for 15 minutes without incident. He was provided with Vaccine Information Sheet and instruction to access the V-Safe system.   John Bullock was instructed to call 911 with any severe reactions post vaccine: Marland Kitchen Difficulty breathing  . Swelling of face and throat  . A fast heartbeat  . A bad rash all over body  . Dizziness and weakness

## 2020-01-28 NOTE — Assessment & Plan Note (Signed)
Will check the lipid panel today 

## 2020-01-28 NOTE — Assessment & Plan Note (Addendum)
He continues to do well with no issues.  He can continue with yearly follow up.  Flu shot today and Prevnar 13.

## 2020-01-29 LAB — T-HELPER CELL (CD4) - (RCID CLINIC ONLY)
CD4 % Helper T Cell: 36 % (ref 33–65)
CD4 T Cell Abs: 564 /uL (ref 400–1790)

## 2020-01-29 LAB — CYTOLOGY, (ORAL, ANAL, URETHRAL) ANCILLARY ONLY
Chlamydia: NEGATIVE
Chlamydia: NEGATIVE
Comment: NEGATIVE
Comment: NEGATIVE
Comment: NORMAL
Comment: NORMAL
Neisseria Gonorrhea: NEGATIVE
Neisseria Gonorrhea: NEGATIVE

## 2020-01-29 LAB — URINE CYTOLOGY ANCILLARY ONLY
Chlamydia: NEGATIVE
Comment: NEGATIVE
Comment: NORMAL
Neisseria Gonorrhea: NEGATIVE

## 2020-01-31 LAB — COMPLETE METABOLIC PANEL WITH GFR
AG Ratio: 1.7 (calc) (ref 1.0–2.5)
ALT: 5 U/L — ABNORMAL LOW (ref 9–46)
AST: 19 U/L (ref 10–35)
Albumin: 4.1 g/dL (ref 3.6–5.1)
Alkaline phosphatase (APISO): 67 U/L (ref 35–144)
BUN: 18 mg/dL (ref 7–25)
CO2: 28 mmol/L (ref 20–32)
Calcium: 9.1 mg/dL (ref 8.6–10.3)
Chloride: 110 mmol/L (ref 98–110)
Creat: 1.15 mg/dL (ref 0.70–1.33)
GFR, Est African American: 81 mL/min/{1.73_m2} (ref >=60)
GFR, Est Non African American: 70 mL/min/{1.73_m2} (ref >=60)
Globulin: 2.4 g/dL (calc) (ref 1.9–3.7)
Glucose, Bld: 92 mg/dL (ref 65–99)
Potassium: 4.1 mmol/L (ref 3.5–5.3)
Sodium: 142 mmol/L (ref 135–146)
Total Bilirubin: 0.3 mg/dL (ref 0.2–1.2)
Total Protein: 6.5 g/dL (ref 6.1–8.1)

## 2020-01-31 LAB — CBC WITH DIFFERENTIAL/PLATELET
Absolute Monocytes: 547 cells/uL (ref 200–950)
Basophils Absolute: 10 cells/uL (ref 0–200)
Basophils Relative: 0.3 %
Eosinophils Absolute: 41 cells/uL (ref 15–500)
Eosinophils Relative: 1.2 %
HCT: 39.8 % (ref 38.5–50.0)
Hemoglobin: 13.2 g/dL (ref 13.2–17.1)
Lymphs Abs: 1612 cells/uL (ref 850–3900)
MCH: 29.9 pg (ref 27.0–33.0)
MCHC: 33.2 g/dL (ref 32.0–36.0)
MCV: 90 fL (ref 80.0–100.0)
MPV: 11.2 fL (ref 7.5–12.5)
Monocytes Relative: 16.1 %
Neutro Abs: 1190 cells/uL — ABNORMAL LOW (ref 1500–7800)
Neutrophils Relative %: 35 %
Platelets: 172 10*3/uL (ref 140–400)
RBC: 4.42 10*6/uL (ref 4.20–5.80)
RDW: 13.1 % (ref 11.0–15.0)
Total Lymphocyte: 47.4 %
WBC: 3.4 10*3/uL — ABNORMAL LOW (ref 3.8–10.8)

## 2020-01-31 LAB — LIPID PANEL
Cholesterol: 172 mg/dL (ref <200)
HDL: 76 mg/dL (ref >=40)
LDL Cholesterol (Calc): 77 mg/dL (calc)
Non-HDL Cholesterol (Calc): 96 mg/dL (calc) (ref <130)
Total CHOL/HDL Ratio: 2.3 (calc) (ref <5.0)
Triglycerides: 109 mg/dL (ref <150)

## 2020-01-31 LAB — HIV-1 RNA QUANT-NO REFLEX-BLD
HIV 1 RNA Quant: <20 Copies/mL — ABNORMAL HIGH
HIV-1 RNA Quant, Log: <1.3 Log cps/mL — ABNORMAL HIGH

## 2020-01-31 LAB — RPR: RPR Ser Ql: NONREACTIVE

## 2020-03-27 ENCOUNTER — Other Ambulatory Visit: Payer: Self-pay | Admitting: Internal Medicine

## 2020-04-03 ENCOUNTER — Other Ambulatory Visit: Payer: Self-pay | Admitting: Internal Medicine

## 2020-04-03 ENCOUNTER — Other Ambulatory Visit: Payer: Self-pay

## 2020-04-03 DIAGNOSIS — B20 Human immunodeficiency virus [HIV] disease: Secondary | ICD-10-CM

## 2020-04-03 DIAGNOSIS — J069 Acute upper respiratory infection, unspecified: Secondary | ICD-10-CM

## 2020-04-03 MED ORDER — GENVOYA 150-150-200-10 MG PO TABS: 1.0000 | ORAL_TABLET | Freq: Every day | ORAL | 3 refills | Status: DC

## 2020-04-13 ENCOUNTER — Encounter (INDEPENDENT_AMBULATORY_CARE_PROVIDER_SITE_OTHER): Payer: Self-pay | Admitting: *Deleted

## 2020-04-13 ENCOUNTER — Other Ambulatory Visit: Payer: Self-pay

## 2020-04-13 VITALS — BP 132/84 | HR 60 | Temp 98.0°F | Wt 203.5 lb

## 2020-04-13 DIAGNOSIS — Z006 Encounter for examination for normal comparison and control in clinical research program: Secondary | ICD-10-CM

## 2020-04-13 NOTE — Research (Signed)
John Bullock was here for his month 30 visit for Reprieve, A Randomized Trial to Prevent Vascular Events in HIV (study drug is Pitavastatin 4mg  or placebo).  He denies any new problems or concerns. He says his adherence is excellent with his arvs and study med. He came in early for his visit so we did not have his prescription ready, but he said he had enough to get him through January. He is getting ready to go out of town for the month of December, but will be back in January. His next study visit is scheduled for March 2022

## 2020-04-20 ENCOUNTER — Encounter: Payer: Medicare HMO | Admitting: *Deleted

## 2020-06-16 ENCOUNTER — Other Ambulatory Visit: Payer: Self-pay

## 2020-06-16 ENCOUNTER — Encounter (HOSPITAL_COMMUNITY): Payer: Self-pay | Admitting: Urgent Care

## 2020-06-16 ENCOUNTER — Ambulatory Visit (HOSPITAL_COMMUNITY)
Admission: EM | Admit: 2020-06-16 | Discharge: 2020-06-16 | Disposition: A | Discharge status: Home / Self Care | Payer: Medicare HMO | Attending: Urgent Care | Admitting: Urgent Care

## 2020-06-16 DIAGNOSIS — Z791 Long term (current) use of non-steroidal anti-inflammatories (NSAID): Secondary | ICD-10-CM | POA: Diagnosis not present

## 2020-06-16 DIAGNOSIS — Z21 Asymptomatic human immunodeficiency virus [HIV] infection status: Secondary | ICD-10-CM | POA: Diagnosis not present

## 2020-06-16 DIAGNOSIS — Z20822 Contact with and (suspected) exposure to covid-19: Secondary | ICD-10-CM | POA: Insufficient documentation

## 2020-06-16 DIAGNOSIS — B349 Viral infection, unspecified: Secondary | ICD-10-CM | POA: Insufficient documentation

## 2020-06-16 DIAGNOSIS — J029 Acute pharyngitis, unspecified: Secondary | ICD-10-CM | POA: Insufficient documentation

## 2020-06-16 DIAGNOSIS — F17210 Nicotine dependence, cigarettes, uncomplicated: Secondary | ICD-10-CM | POA: Insufficient documentation

## 2020-06-16 DIAGNOSIS — R5383 Other fatigue: Secondary | ICD-10-CM | POA: Insufficient documentation

## 2020-06-16 DIAGNOSIS — R5381 Other malaise: Secondary | ICD-10-CM | POA: Insufficient documentation

## 2020-06-16 DIAGNOSIS — Z79899 Other long term (current) drug therapy: Secondary | ICD-10-CM | POA: Insufficient documentation

## 2020-06-16 LAB — SARS CORONAVIRUS 2 (TAT 6-24 HRS): SARS Coronavirus 2: NEGATIVE

## 2020-06-16 LAB — POCT RAPID STREP A, ED / UC: Streptococcus, Group A Screen (Direct): NEGATIVE

## 2020-06-16 MED ORDER — PROMETHAZINE-DM 6.25-15 MG/5ML PO SYRP
5.0000 mL | ORAL_SOLUTION | Freq: Every evening | ORAL | 0 refills | Status: DC | PRN
Start: 2020-06-16 — End: 2021-04-07

## 2020-06-16 MED ORDER — PSEUDOEPHEDRINE HCL 60 MG PO TABS
60.0000 mg | ORAL_TABLET | Freq: Three times a day (TID) | ORAL | 0 refills | Status: DC | PRN
Start: 2020-06-16 — End: 2021-04-07

## 2020-06-16 MED ORDER — ACETAMINOPHEN 325 MG PO TABS
650.0000 mg | ORAL_TABLET | Freq: Once | ORAL | Status: AC
  Administered 2020-06-16: 650 mg via ORAL

## 2020-06-16 MED ORDER — CETIRIZINE HCL 10 MG PO TABS: 10.0000 mg | ORAL_TABLET | Freq: Every day | ORAL | 0 refills | Status: DC

## 2020-06-16 MED ORDER — BENZONATATE 100 MG PO CAPS: 100.0000 mg | ORAL_CAPSULE | Freq: Three times a day (TID) | ORAL | 0 refills | Status: DC | PRN

## 2020-06-16 MED ORDER — ACETAMINOPHEN 325 MG PO TABS
ORAL_TABLET | ORAL | Status: AC
  Filled 2020-06-16: qty 2

## 2020-06-16 NOTE — ED Provider Notes (Signed)
[facility]   MRN: [id number] DOB: [birth year]  Subjective:   John Bullock is a 60 y.o. male presenting for 2-day history of acute onset throat pain, cough, malaise and fatigue.  Patient is COVID vaccinated.  Denies chest pain, shortness of breath.  Has a history of an HIV infection, takes [place] for this.  No current facility-administered medications for this encounter.  Current Outpatient Medications:  .  albuterol (VENTOLIN HFA) 108 (90 Base) MCG/ACT inhaler, INHALE TWO PUFFS BY MOUTH INTO THE LUNGS EVERY 4 HOURS AS NEEDED FOR WHEEZING OR SHORTNESS OF BREATH, Disp: 18 g, Rfl: 3 .  cetirizine (ZYRTEC) 10 MG tablet, TAKE ONE TABLET BY MOUTH DAILY, Disp: 30 tablet, Rfl: 3 .  elvitegravir-cobicistat-emtricitabine-tenofovir (GENVOYA) 150-150-200-10 MG TABS tablet, Take 1 tablet by mouth daily with breakfast., Disp: 30 tablet, Rfl: 3 .  fluticasone (FLONASE) 50 MCG/ACT nasal spray, , Disp: , Rfl:  .  Naproxen (NAPROXEN) 375 MG TBEC, TAKE ONE TABLET BY MOUTH TWICE A DAY BEFORE A MEAL, Disp: 30 tablet, Rfl: 0 .  oseltamivir (TAMIFLU) 75 MG capsule, Take 1 capsule (75 mg total) by mouth every 12 (twelve) hours., Disp: 10 capsule, Rfl: 0 .  Pitavastatin Calcium 4 MG TABS, Take 1 tablet (4 mg total) by mouth daily. This may be placebo and is study provided. Do not fill prescription., Disp: 30 tablet, Rfl: 11 .  pregabalin (LYRICA) 50 MG capsule, Take 1 capsule (50 mg total) by mouth daily., Disp: 30 capsule, Rfl: 0 .  pregabalin (LYRICA) 75 MG capsule, , Disp: , Rfl:    Allergies  Allergen Reactions  . Nicotine Step [Nicotine] Palpitations and Rash    Smoker's patch    Past Medical History:  Diagnosis Date  . HIV infection ([place])   . HTN (hypertension) 09/11/2014  . Migraine      Past Surgical History:  Procedure Laterality Date  . APPENDECTOMY  1990    Family History  Problem Relation Age of Onset  . Brain cancer Mother   . Cancer Mother   . Stroke Mother   .  Testicular cancer Father   . Cancer Father   . Stroke Father   . Hypertension Father   . Diabetes Father     Social History   Tobacco Use  . Smoking status: Current Every Day Smoker    Packs/day: 0.10    Years: 30.00    Pack years: 3.00    Types: Cigarettes  . Smokeless tobacco: Never Used  . Tobacco comment: trying to quit. 2-3 per day   Vaping Use  . Vaping Use: Never used  Substance Use Topics  . Alcohol use: Yes    Alcohol/week: 4.0 standard drinks    Types: 4 Shots of liquor per week    Comment: on Thursdays with [employer] game  . Drug use: No    ROS   Objective:   Vitals: BP (!) 144/84 (BP Location: Left Arm)   Pulse 69   Temp 97.8 F (36.6 C) (Oral)   Resp 18   SpO2 98%   Physical Exam Constitutional:      General: He is not in acute distress.    Appearance: Normal appearance. He is well-developed. He is not ill-appearing, toxic-appearing or diaphoretic.  HENT:     Head: Normocephalic and atraumatic.     Right Ear: External ear normal.     Left Ear: External ear normal.     Nose: Nose normal.     Mouth/Throat:  Mouth: Mucous membranes are moist.     Pharynx: Oropharynx is clear. Posterior oropharyngeal erythema present. No pharyngeal swelling, oropharyngeal exudate or uvula swelling.     Tonsils: No tonsillar exudate or tonsillar abscesses. 0 on the right. 0 on the left.  Eyes:     General: No scleral icterus.    Extraocular Movements: Extraocular movements intact.     Pupils: Pupils are equal, round, and reactive to light.  Cardiovascular:     Rate and Rhythm: Normal rate and regular rhythm.     Heart sounds: Normal heart sounds. No murmur heard. No friction rub. No gallop.   Pulmonary:     Effort: Pulmonary effort is normal. No respiratory distress.     Breath sounds: Normal breath sounds. No stridor. No wheezing, rhonchi or rales.  Neurological:     Mental Status: He is alert and oriented to person, place, and time.  Psychiatric:        Mood  and Affect: Mood normal.        Behavior: Behavior normal.        Thought Content: Thought content normal.     Results for orders placed or performed during the hospital encounter of 06/16/20 (from the past 24 hour(s))  POCT Rapid Strep A     Status: None   Collection Time: 06/16/20  2:58 PM  Result Value Ref Range   Streptococcus, Group A Screen (Direct) NEGATIVE NEGATIVE    Assessment and Plan :   PDMP not reviewed this encounter.  1. Viral syndrome   2. Sore throat   3. Malaise and fatigue     Will manage for viral illness such as viral URI, viral syndrome, viral rhinitis, COVID-19. Counseled patient on nature of COVID-19 including modes of transmission, diagnostic testing, management and supportive care.  Offered scripts for symptomatic relief. COVID 19 testing and strep culture are pending. Counseled patient on potential for adverse effects with medications prescribed/recommended today, ER and return-to-clinic precautions discussed, patient verbalized understanding.     Jaynee Eagles, PA-C 06/16/20 [id number]

## 2020-06-16 NOTE — ED Triage Notes (Signed)
Pt reports Sore throat that started yesterday making it hard to eat and drink. This started on Monday.

## 2020-06-16 NOTE — Discharge Instructions (Addendum)

## 2020-06-19 LAB — CULTURE, GROUP A STREP (THRC)

## 2020-08-11 ENCOUNTER — Encounter: Payer: Medicare HMO | Admitting: *Deleted

## 2020-08-17 ENCOUNTER — Encounter (INDEPENDENT_AMBULATORY_CARE_PROVIDER_SITE_OTHER): Payer: Self-pay | Admitting: *Deleted

## 2020-08-17 ENCOUNTER — Other Ambulatory Visit: Payer: Self-pay

## 2020-08-17 VITALS — BP 149/88 | HR 54 | Temp 97.4°F | Wt 197.4 lb

## 2020-08-17 DIAGNOSIS — Z006 Encounter for examination for normal comparison and control in clinical research program: Secondary | ICD-10-CM

## 2020-08-17 NOTE — Research (Signed)
Eagan here today for his month 12 Reprieve visit. Verbalized excellent adherence with [name] and study medications. States that he has been having (R) shoulder/arm pain for about a week. Denied any injury. Notes pain with certain movements along with some decrease ROM. Has used biofreeze to the are with some relief. Will schedule and appointment with his PCP or orthopedist if symptoms do not improve or if they become worse. He will return in August for his next study visit.

## 2020-08-31 ENCOUNTER — Other Ambulatory Visit: Payer: Self-pay | Admitting: Internal Medicine

## 2020-08-31 DIAGNOSIS — B20 Human immunodeficiency virus [HIV] disease: Secondary | ICD-10-CM

## 2020-09-17 ENCOUNTER — Encounter: Payer: Self-pay | Admitting: *Deleted

## 2020-09-17 NOTE — Progress Notes (Signed)

## 2020-09-18 NOTE — Progress Notes (Signed)
Things That May Be Affecting Your Health:  Alcohol  Hearing loss  Pain    Depression  Home Safety  Sexual Health   Diabetes  Lack of physical activity  Stress   Difficulty with daily activities  Loneliness  Tiredness   Drug use  Medicines X Tobacco use   Falls  Motor Vehicle Safety X Weight  X Food choices X Oral Health  Other    YOUR PERSONALIZED HEALTH PLAN : 1. Schedule your next subsequent Medicare Wellness visit in one year 2. Attend all of your regular appointments to address your medical issues 3. Complete the preventative screenings and services   Annual Wellness Visit   Medicare Covered Preventative Screenings and [place] Men and Women Who How Often Need? Date of Last Service Action  Abdominal Aortic Aneurysm Adults with AAA risk factors Once      Alcohol Misuse and Counseling All Adults Screening once a year if no alcohol misuse. Counseling up to 4 face to face sessions.     Bone Density Measurement  Adults at risk for osteoporosis Once every 2 yrs      Lipid Panel Z13.6 All adults without CV disease Once every 5 yrs       Colorectal Cancer   Stool sample or  Colonoscopy All adults 43 and older   Once every year  Every 10 years        Depression All Adults Once a year  Today   Diabetes Screening Blood glucose, post glucose load, or GTT Z13.1  All adults at risk  Pre-diabetics  Once per year  Twice per year      Diabetes  Self-Management Training All adults Diabetics 10 hrs first year; 2 hours subsequent years. Requires Copay     Glaucoma  Diabetics  Family history of glaucoma  African Americans 9 yrs +  Hispanic Americans 28 yrs + Annually - requires coppay      Hepatitis C Z72.89 or F19.20  High Risk for HCV  Born between 1945 and 1965  Annually  Once      HIV Z11.4 All adults based on risk  Annually btw ages 35 & 6 regardless of risk  Annually > 65 yrs if at increased risk      Lung Cancer Screening  Asymptomatic adults aged 49-77 with 30 pack yr history and current smoker OR quit within the last 15 yrs Annually Must have counseling and shared decision making documentation before first screen      Medical Nutrition Therapy Adults with   Diabetes  Renal disease  Kidney transplant within past 3 yrs 3 hours first year; 2 hours subsequent years     Obesity and Counseling All adults Screening once a year Counseling if BMI 30 or higher  Today   Tobacco Use Counseling Adults who use tobacco  Up to 8 visits in one year     Vaccines Z23  Hepatitis B  Influenza   Pneumonia  Adults   Once  Once every flu season  Two different vaccines separated by one year TETANUS/COVID    Next Annual Wellness Visit People with Medicare Every year  Today     Services & Screenings Women Who How Often Need  Date of Last Service Action  Mammogram  Z12.31 Women over 9 One baseline ages 48-39. Annually ager 40 yrs+      Pap tests All women Annually if high risk. Every 2 yrs for normal risk women  Screening for cervical cancer with   Pap (Z01.419 nl or Z01.411abnl) &  HPV Z11.51 Women aged 50 to 80 Once every 5 yrs     Screening pelvic and breast exams All women Annually if high risk. Every 2 yrs for normal risk women     Sexually Transmitted Diseases  Chlamydia  Gonorrhea  Syphilis All at risk adults Annually for non pregnant females at increased risk         [place] Men Who How Ofter Need  Date of Last Service Action  Prostate Cancer - DRE & PSA Men over 50 Annually.  DRE might require a copay.        Sexually Transmitted Diseases  Syphilis All at risk adults Annually for men at increased risk      Health Maintenance List Health Maintenance  Topic Date Due  . TETANUS/TDAP  Never done  . COVID-19 Vaccine (2 - Moderna risk 4-dose series) 02/25/2020  . INFLUENZA VACCINE  12/14/2020  . COLONOSCOPY (Pts 45-7yrs Insurance coverage will need to be confirmed)   11/28/2021  . Hepatitis C Screening  Completed  . HIV Screening  Completed  . HPV VACCINES  Aged Out

## 2020-10-03 ENCOUNTER — Encounter: Payer: Self-pay | Admitting: *Deleted

## 2020-11-07 ENCOUNTER — Other Ambulatory Visit: Payer: Self-pay | Admitting: Internal Medicine

## 2020-11-11 ENCOUNTER — Other Ambulatory Visit: Payer: Self-pay | Admitting: Family

## 2020-11-11 DIAGNOSIS — B20 Human immunodeficiency virus [HIV] disease: Secondary | ICD-10-CM

## 2020-12-14 ENCOUNTER — Encounter: Payer: Medicare HMO | Admitting: *Deleted

## 2020-12-23 ENCOUNTER — Other Ambulatory Visit: Payer: Self-pay

## 2020-12-23 ENCOUNTER — Encounter (INDEPENDENT_AMBULATORY_CARE_PROVIDER_SITE_OTHER): Payer: Self-pay | Admitting: *Deleted

## 2020-12-23 DIAGNOSIS — Z006 Encounter for examination for normal comparison and control in clinical research program: Secondary | ICD-10-CM

## 2020-12-23 NOTE — Research (Signed)
John Bullock was here for his month 81 visit for Reprieve. He denies any new problems , except for fatigue from his job, no new medications. He says his adherence with study drug is excellent. He will be returning in November.

## 2021-01-22 ENCOUNTER — Other Ambulatory Visit: Payer: Self-pay

## 2021-01-22 ENCOUNTER — Ambulatory Visit (INDEPENDENT_AMBULATORY_CARE_PROVIDER_SITE_OTHER): Payer: Medicare HMO

## 2021-01-22 DIAGNOSIS — Z23 Encounter for immunization: Secondary | ICD-10-CM | POA: Diagnosis not present

## 2021-01-22 NOTE — Progress Notes (Signed)
    [facility]  Name:  John Bullock    MRN: [id number] DOB: May 25, 1960   01/22/2021  John Bullock was observed post [facility] immunization for 15 minutes without incident. He was provided with Vaccine Information Sheet and instruction to access the V-Safe system.   John Bullock was instructed to call 911 with any severe reactions post vaccine: Difficulty breathing  Swelling of face and throat  A fast heartbeat  A bad rash all over body  Dizziness and weakness     Justene Jensen T Brooks Sailors

## 2021-02-19 ENCOUNTER — Ambulatory Visit: Payer: Medicare HMO

## 2021-03-05 ENCOUNTER — Ambulatory Visit: Payer: Medicare HMO

## 2021-03-23 ENCOUNTER — Other Ambulatory Visit: Payer: Self-pay | Admitting: Internal Medicine

## 2021-03-23 DIAGNOSIS — B20 Human immunodeficiency virus [HIV] disease: Secondary | ICD-10-CM

## 2021-03-23 NOTE — Telephone Encounter (Signed)
DDI flag when refilling Genvoya due to active Flonase prescription. Spoke with patient, he says he is taking Flonase and has been doing so for the last 4 years. Advised him that these medications are contraindicated and he should not use Flonase while on Genvoya. Patient verbalized understanding and has no further questions.   Beryle Flock, RN

## 2021-03-23 NOTE — Telephone Encounter (Signed)
Pharmacy states they have not sent patient his medication since 12/02/20. RN called patient, he says he has not run out of medication and that they sent it last month. He is adamant that he would have called our office if he had run out of medication. Will send in 1 month supply to get him to his next appointment with Dr. Linus Salmons.   Beryle Flock, RN

## 2021-03-24 ENCOUNTER — Other Ambulatory Visit: Payer: Self-pay

## 2021-03-24 DIAGNOSIS — B20 Human immunodeficiency virus [HIV] disease: Secondary | ICD-10-CM

## 2021-03-24 DIAGNOSIS — Z79899 Other long term (current) drug therapy: Secondary | ICD-10-CM

## 2021-03-24 DIAGNOSIS — Z113 Encounter for screening for infections with a predominantly sexual mode of transmission: Secondary | ICD-10-CM

## 2021-03-26 ENCOUNTER — Telehealth: Payer: Self-pay

## 2021-03-26 NOTE — Telephone Encounter (Signed)
Outbound call attempted to offer patient 2nd MPX vaccination. Left HIPPA compliant voicemail to return office call. John Bullock

## 2021-04-01 ENCOUNTER — Other Ambulatory Visit: Payer: Medicare HMO

## 2021-04-05 ENCOUNTER — Ambulatory Visit (INDEPENDENT_AMBULATORY_CARE_PROVIDER_SITE_OTHER): Payer: Medicare HMO

## 2021-04-05 ENCOUNTER — Encounter (INDEPENDENT_AMBULATORY_CARE_PROVIDER_SITE_OTHER): Payer: Medicare HMO | Admitting: *Deleted

## 2021-04-05 ENCOUNTER — Other Ambulatory Visit: Payer: Self-pay

## 2021-04-05 VITALS — BP 110/72 | HR 62 | Temp 97.8°F | Wt 186.6 lb

## 2021-04-05 DIAGNOSIS — Z23 Encounter for immunization: Secondary | ICD-10-CM | POA: Diagnosis not present

## 2021-04-05 DIAGNOSIS — Z006 Encounter for examination for normal comparison and control in clinical research program: Secondary | ICD-10-CM

## 2021-04-05 NOTE — Research (Signed)
John Bullock here today for his month 24 Reprieve study visit. States he has a small "swollen" area to his (R) inner foot x 1 week. Denied discomfort. Small scabbed area noted. No redness or drainage noted to area. He has an appointment with his PCP tomorrow. He agreed that he would have them look at the area.  Verbalized excellent adherence with his study medication. New supply of study IP dispensed. Denied any muscle aches or weakness. He will return in March for his next study visit.

## 2021-04-06 ENCOUNTER — Encounter: Payer: Medicare HMO | Admitting: *Deleted

## 2021-04-07 ENCOUNTER — Other Ambulatory Visit: Payer: Self-pay

## 2021-04-07 ENCOUNTER — Ambulatory Visit (INDEPENDENT_AMBULATORY_CARE_PROVIDER_SITE_OTHER): Payer: Medicare HMO | Admitting: Internal Medicine

## 2021-04-07 ENCOUNTER — Encounter: Payer: Self-pay | Admitting: Internal Medicine

## 2021-04-07 VITALS — BP 137/72 | HR 50 | Temp 97.6°F | Ht 72.0 in | Wt 188.8 lb

## 2021-04-07 DIAGNOSIS — G6289 Other specified polyneuropathies: Secondary | ICD-10-CM | POA: Diagnosis not present

## 2021-04-07 DIAGNOSIS — B20 Human immunodeficiency virus [HIV] disease: Secondary | ICD-10-CM

## 2021-04-07 DIAGNOSIS — I10 Essential (primary) hypertension: Secondary | ICD-10-CM

## 2021-04-07 DIAGNOSIS — D492 Neoplasm of unspecified behavior of bone, soft tissue, and skin: Secondary | ICD-10-CM

## 2021-04-07 MED ORDER — BENZONATATE 100 MG PO CAPS: 100.0000 mg | ORAL_CAPSULE | Freq: Three times a day (TID) | ORAL | 0 refills | Status: DC | PRN

## 2021-04-07 MED ORDER — NAPROXEN 375 MG PO TBEC: DELAYED_RELEASE_TABLET | ORAL | 0 refills | Status: AC

## 2021-04-07 MED ORDER — ALBUTEROL SULFATE HFA 108 (90 BASE) MCG/ACT IN AERS: INHALATION_SPRAY | RESPIRATORY_TRACT | 6 refills | Status: AC

## 2021-04-07 MED ORDER — GENVOYA 150-150-200-10 MG PO TABS: 1.0000 | ORAL_TABLET | Freq: Every day | ORAL | 3 refills | Status: DC

## 2021-04-07 NOTE — Assessment & Plan Note (Signed)
Refilled Genvoya. Pt to see ID on Monday.

## 2021-04-07 NOTE — Patient Instructions (Signed)
Thank you, John Bullock for allowing [place] to provide your care today.  1) I have referred you to Dermatology for the growth on your stomach. They will reach out to arrange an appointment with you.  2) I have refilled your medications.  I have ordered the following labs for you:   Lab Orders         BMP8+Anion Gap         Lipid Profile         CBC no Diff       Referrals ordered today:   Referral Orders  No referral(s) requested today     I have ordered the following medication/changed the following medications:   Stop the following medications: Medications Discontinued During This Encounter  Medication Reason   fluticasone (FLONASE) 50 MCG/ACT nasal spray    Naproxen (NAPROXEN) 375 MG TBEC Reorder   benzonatate (TESSALON) 100 MG capsule Reorder   albuterol (VENTOLIN HFA) 108 (90 Base) MCG/ACT inhaler Reorder   GENVOYA 150-150-200-10 MG TABS tablet Reorder     Start the following medications: Meds ordered this encounter  Medications   elvitegravir-cobicistat-emtricitabine-tenofovir (GENVOYA) 150-150-200-10 MG TABS tablet    Sig: Take 1 tablet by mouth daily with breakfast.    Dispense:  90 tablet    Refill:  3    Additional refills at 11/28 appt   albuterol (VENTOLIN HFA) 108 (90 Base) MCG/ACT inhaler    Sig: INHALE TWO PUFFS BY MOUTH INTO THE LUNGS EVERY 4 HOURS AS NEEDED FOR WHEEZING OR SHORTNESS OF BREATH    Dispense:  18 g    Refill:  6   benzonatate (TESSALON) 100 MG capsule    Sig: Take 1-2 capsules (100-200 mg total) by mouth 3 (three) times daily as needed for cough.    Dispense:  60 capsule    Refill:  0   naproxen 375 MG TBEC    Sig: TAKE ONE TABLET BY MOUTH TWICE A DAY BEFORE A MEAL    Dispense:  30 tablet    Refill:  0     Follow up: 3 months - 6 months    Should you have any questions or concerns please call the internal medicine clinic at [phone].

## 2021-04-07 NOTE — Assessment & Plan Note (Signed)
Pt states that he has a pink skin growth on his lower abdomen that has been bothersome for years. He states that it will drain sometimes and bleed when it gets caught in his underwear. It has been stable in size over the years. On exam, he has a 1x1 cm pink lesion with no active signs of bleeding. Pt states that he was seen for this at an outside hospital about a year ago and a referral was made, but he was never contacted.  -Referral to Dermatology placed today

## 2021-04-07 NOTE — Progress Notes (Signed)
   CC: routine check-up  HPI:  [name] is a 60 y.o. with past medical history as noted below who presents to the clinic today for routine check-up. Please see problem-based list for further details, assessments, and plans.  Past Medical History:  Diagnosis Date   HIV infection ([place])    HTN (hypertension) 09/11/2014   Migraine    Review of Systems:  Review of Systems  Constitutional: Negative.   HENT: Negative.    Eyes: Negative.   Respiratory: Negative.    Cardiovascular: Negative.   Gastrointestinal: Negative.   Genitourinary: Negative.   Musculoskeletal: Negative.   Skin: Negative.   Neurological: Negative.   Endo/Heme/Allergies: Negative.   Psychiatric/Behavioral: Negative.      Physical Exam:  Vitals:   04/07/21 0915  BP: 137/72  Pulse: (!) 50  Temp: 97.6 F (36.4 C)  TempSrc: Oral  SpO2: 100%  Weight: 188 lb 12.8 oz (85.6 kg)  Height: 6' (1.829 m)   Physical Exam Constitutional:      General: He is not in acute distress.    Appearance: Normal appearance.  HENT:     Head: Normocephalic and atraumatic.  Eyes:     Extraocular Movements: Extraocular movements intact.     Pupils: Pupils are equal, round, and reactive to light.  Cardiovascular:     Rate and Rhythm: Normal rate and regular rhythm.     Heart sounds: No murmur heard.   No friction rub. No gallop.  Pulmonary:     Effort: Pulmonary effort is normal.     Breath sounds: Normal breath sounds. No wheezing, rhonchi or rales.  Abdominal:     General: Abdomen is flat. There is no distension.  Musculoskeletal:        General: Normal range of motion.  Skin:    General: Skin is warm and dry.     Comments: There is a 1x1 cm pink lesion on the lower abdomen with no active bleeding or drainage  There is a small dried ulcer on the medial aspect of the right foot with no erythema or drainage noted  Neurological:     General: No focal deficit present.     Mental Status: He is alert and oriented  to person, place, and time. Mental status is at baseline.  Psychiatric:        Mood and Affect: Mood normal.        Behavior: Behavior normal.    Assessment & Plan:   See Encounters Tab for problem based charting.  Patient seen with Dr. Heber [place]

## 2021-04-07 NOTE — Assessment & Plan Note (Addendum)
BP Readings from Last 3 Encounters:  04/07/21 137/72  04/05/21 110/72  08/17/20 (!) 149/88   Recordings show well-controlled BP. Currently not on any blood pressure medications. Will continue to monitor at future visits.  -BMP today

## 2021-04-08 LAB — CBC
Hematocrit: 40.2 % (ref 37.5–51.0)
Hemoglobin: 13.4 g/dL (ref 13.0–17.7)
MCH: 30.3 pg (ref 26.6–33.0)
MCHC: 33.3 g/dL (ref 31.5–35.7)
MCV: 91 fL (ref 79–97)
Platelets: 162 10*3/uL (ref 150–450)
RBC: 4.42 x10E6/uL (ref 4.14–5.80)
RDW: 13.1 % (ref 11.6–15.4)
WBC: 3.5 10*3/uL (ref 3.4–10.8)

## 2021-04-08 LAB — BMP8+ANION GAP
Anion Gap: 14 mmol/L (ref 10.0–18.0)
BUN/Creatinine Ratio: 16 (ref 9–20)
BUN: 15 mg/dL (ref 6–24)
CO2: 19 mmol/L — ABNORMAL LOW (ref 20–29)
Calcium: 8.6 mg/dL — ABNORMAL LOW (ref 8.7–10.2)
Chloride: 108 mmol/L — ABNORMAL HIGH (ref 96–106)
Creatinine, Ser: 0.92 mg/dL (ref 0.76–1.27)
Glucose: 92 mg/dL (ref 70–99)
Potassium: 4.5 mmol/L (ref 3.5–5.2)
Sodium: 141 mmol/L (ref 134–144)
eGFR: 96 mL/min/{1.73_m2} (ref >59)

## 2021-04-08 LAB — LIPID PANEL
Chol/HDL Ratio: 2.3 ratio (ref 0.0–5.0)
Cholesterol, Total: 173 mg/dL (ref 100–199)
HDL: 75 mg/dL (ref >39)
LDL Chol Calc (NIH): 71 mg/dL (ref 0–99)
Triglycerides: 161 mg/dL — ABNORMAL HIGH (ref 0–149)
VLDL Cholesterol Cal: 27 mg/dL (ref 5–40)

## 2021-04-09 NOTE — Progress Notes (Signed)
Internal Medicine Clinic Attending ? ?I saw and evaluated the patient.  I personally confirmed the key portions of the history and exam documented by Dr. Bonanno and I reviewed pertinent patient test results.  The assessment, diagnosis, and plan were formulated together and I agree with the documentation in the resident?s note. ? ?

## 2021-04-12 ENCOUNTER — Other Ambulatory Visit: Payer: Self-pay

## 2021-04-12 ENCOUNTER — Encounter: Payer: Self-pay | Admitting: Internal Medicine

## 2021-04-12 ENCOUNTER — Ambulatory Visit (INDEPENDENT_AMBULATORY_CARE_PROVIDER_SITE_OTHER): Payer: Medicare HMO | Admitting: Internal Medicine

## 2021-04-12 ENCOUNTER — Other Ambulatory Visit (HOSPITAL_COMMUNITY)
Admission: RE | Admit: 2021-04-12 | Discharge: 2021-04-12 | Disposition: A | Discharge status: Home / Self Care | Payer: Medicare HMO | Source: Ambulatory Visit | Attending: Internal Medicine | Admitting: Internal Medicine

## 2021-04-12 VITALS — BP 111/73 | HR 62 | Temp 97.6°F | Wt 182.0 lb

## 2021-04-12 DIAGNOSIS — Z720 Tobacco use: Secondary | ICD-10-CM

## 2021-04-12 DIAGNOSIS — B20 Human immunodeficiency virus [HIV] disease: Secondary | ICD-10-CM | POA: Diagnosis not present

## 2021-04-12 DIAGNOSIS — Z113 Encounter for screening for infections with a predominantly sexual mode of transmission: Secondary | ICD-10-CM | POA: Diagnosis present

## 2021-04-12 DIAGNOSIS — Z21 Asymptomatic human immunodeficiency virus [HIV] infection status: Secondary | ICD-10-CM

## 2021-04-12 MED ORDER — GENVOYA 150-150-200-10 MG PO TABS: 1.0000 | ORAL_TABLET | Freq: Every day | ORAL | 3 refills | Status: AC

## 2021-04-12 NOTE — Assessment & Plan Note (Signed)
Will screen today 

## 2021-04-12 NOTE — Progress Notes (Signed)
   Subjective:    Patient ID: Landry Kamath, male    DOB: 1961-03-24, 60 y.o.   MRN: [id number]  HPI Here for follow up of HIV He continues on Genvoya with no missed doses.  Here for his yearly follow up but prefers every 6 months. No complaints and no issues with getting, taking or tolerating the medication.  No issues.    Review of Systems  Constitutional:  Negative for fatigue.  Gastrointestinal:  Negative for diarrhea and nausea.  Skin:  Negative for rash.      Objective:   Physical Exam Eyes:     General: No scleral icterus. Pulmonary:     Effort: Pulmonary effort is normal.  Skin:    Findings: No rash.  Neurological:     General: No focal deficit present.     Mental Status: He is alert.   SH: + tobacco       Assessment & Plan:

## 2021-04-12 NOTE — Assessment & Plan Note (Signed)
Discussed importance of cessation  

## 2021-04-12 NOTE — Assessment & Plan Note (Signed)
No complaints and no changes indicated.  Discussed Cabenuva and he prefers to stick with the oral medication.  rtc in 6 months

## 2021-04-13 LAB — URINE CYTOLOGY ANCILLARY ONLY
Chlamydia: NEGATIVE
Comment: NEGATIVE
Comment: NORMAL
Neisseria Gonorrhea: NEGATIVE

## 2021-04-13 LAB — HIV-1 RNA QUANT-NO REFLEX-BLD
HIV 1 RNA Quant: 1190 Copies/mL — ABNORMAL HIGH
HIV-1 RNA Quant, Log: 3.08 Log cps/mL — ABNORMAL HIGH

## 2021-04-13 LAB — RPR: RPR Ser Ql: NONREACTIVE

## 2021-04-13 LAB — T-HELPER CELL (CD4) - (RCID CLINIC ONLY)
CD4 % Helper T Cell: 32 % — ABNORMAL LOW (ref 33–65)
CD4 T Cell Abs: 421 /uL (ref 400–1790)

## 2021-04-14 ENCOUNTER — Telehealth: Payer: Self-pay

## 2021-04-14 ENCOUNTER — Other Ambulatory Visit: Payer: Self-pay | Admitting: Internal Medicine

## 2021-04-14 DIAGNOSIS — Z21 Asymptomatic human immunodeficiency virus [HIV] infection status: Secondary | ICD-10-CM

## 2021-04-14 NOTE — Telephone Encounter (Signed)
Called patient to discuss results and schedule lab appointment, no answer. Left HIPAA compliant voicemail requesting callback.   Beryle Flock, RN

## 2021-04-14 NOTE — Telephone Encounter (Signed)
-----   Message from Thayer Headings, MD sent at 04/14/2021 10:23 AM EST ----- His viral load is up unexpectedly.  Can you have him come in next week to repeat it?  Thanks, order is in with a genotype.

## 2021-04-15 NOTE — Telephone Encounter (Signed)
Spoke with patient, discussed his increased viral load. He says he was out of his medication for about 1 week, but that he is back on it now. He accepts appointment for repeat labs 12/7.   Beryle Flock, RN

## 2021-04-21 ENCOUNTER — Other Ambulatory Visit: Payer: Medicare HMO

## 2021-04-21 ENCOUNTER — Other Ambulatory Visit: Payer: Self-pay

## 2021-04-21 DIAGNOSIS — Z21 Asymptomatic human immunodeficiency virus [HIV] infection status: Secondary | ICD-10-CM

## 2021-04-24 LAB — HIV-1 RNA ULTRAQUANT REFLEX TO GENTYP+
HIV 1 RNA Quant: 62 copies/mL — ABNORMAL HIGH
HIV-1 RNA Quant, Log: 1.79 Log copies/mL — ABNORMAL HIGH

## 2021-06-03 ENCOUNTER — Telehealth: Payer: Self-pay

## 2021-06-03 NOTE — Telephone Encounter (Signed)
Received call from [place] with [facility] ID requesting patient's labs be faxed to patient's new ID provider, Dr. Lolita Lenz.   ROI form on file requests that records be released to the patient. Lattie Haw states they received office notes, but no labs.   Called patient to confirm he would like his records faxed to Dr. Lolita Lenz, no answer. Left HIPAA compliant voicemail requesting callback.   [facility] ID P: [phone] F: [place], RN'

## 2021-08-09 ENCOUNTER — Encounter: Payer: Medicare HMO | Admitting: *Deleted

## 2021-11-06 ENCOUNTER — Encounter: Payer: Self-pay | Admitting: *Deleted
